# Patient Record
Sex: Female | Born: 1935 | Race: White | Hispanic: No | Smoking: Never smoker
Health system: Southern US, Community
[De-identification: ages and names within clinical notes are randomized; demographics above are authoritative.]

## PROBLEM LIST (undated history)

## (undated) DIAGNOSIS — N184 Chronic kidney disease, stage 4 (severe): Secondary | ICD-10-CM

## (undated) DIAGNOSIS — I671 Cerebral aneurysm, nonruptured: Secondary | ICD-10-CM

## (undated) DIAGNOSIS — F419 Anxiety disorder, unspecified: Secondary | ICD-10-CM

## (undated) DIAGNOSIS — Z8639 Personal history of other endocrine, nutritional and metabolic disease: Secondary | ICD-10-CM

## (undated) DIAGNOSIS — E041 Nontoxic single thyroid nodule: Secondary | ICD-10-CM

## (undated) DIAGNOSIS — Z8669 Personal history of other diseases of the nervous system and sense organs: Secondary | ICD-10-CM

## (undated) DIAGNOSIS — K589 Irritable bowel syndrome without diarrhea: Secondary | ICD-10-CM

## (undated) DIAGNOSIS — M1990 Unspecified osteoarthritis, unspecified site: Secondary | ICD-10-CM

## (undated) DIAGNOSIS — I5032 Chronic diastolic (congestive) heart failure: Secondary | ICD-10-CM

## (undated) DIAGNOSIS — K219 Gastro-esophageal reflux disease without esophagitis: Secondary | ICD-10-CM

## (undated) DIAGNOSIS — F329 Major depressive disorder, single episode, unspecified: Secondary | ICD-10-CM

## (undated) DIAGNOSIS — G2581 Restless legs syndrome: Secondary | ICD-10-CM

## (undated) DIAGNOSIS — H5440 Blindness, one eye, unspecified eye: Secondary | ICD-10-CM

## (undated) DIAGNOSIS — K5730 Diverticulosis of large intestine without perforation or abscess without bleeding: Secondary | ICD-10-CM

## (undated) DIAGNOSIS — H3530 Unspecified macular degeneration: Secondary | ICD-10-CM

## (undated) DIAGNOSIS — E0590 Thyrotoxicosis, unspecified without thyrotoxic crisis or storm: Secondary | ICD-10-CM

## (undated) DIAGNOSIS — R040 Epistaxis: Secondary | ICD-10-CM

## (undated) DIAGNOSIS — E785 Hyperlipidemia, unspecified: Secondary | ICD-10-CM

## (undated) DIAGNOSIS — I495 Sick sinus syndrome: Secondary | ICD-10-CM

## (undated) DIAGNOSIS — I10 Essential (primary) hypertension: Secondary | ICD-10-CM

## (undated) DIAGNOSIS — I351 Nonrheumatic aortic (valve) insufficiency: Secondary | ICD-10-CM

## (undated) DIAGNOSIS — F32A Depression, unspecified: Secondary | ICD-10-CM

## (undated) DIAGNOSIS — I2510 Atherosclerotic heart disease of native coronary artery without angina pectoris: Secondary | ICD-10-CM

## (undated) DIAGNOSIS — F410 Panic disorder [episodic paroxysmal anxiety] without agoraphobia: Secondary | ICD-10-CM

## (undated) DIAGNOSIS — D509 Iron deficiency anemia, unspecified: Secondary | ICD-10-CM

## (undated) DIAGNOSIS — M109 Gout, unspecified: Secondary | ICD-10-CM

## (undated) DIAGNOSIS — I4821 Permanent atrial fibrillation: Secondary | ICD-10-CM

## (undated) HISTORY — DX: Irritable bowel syndrome, unspecified: K58.9

## (undated) HISTORY — DX: Nonrheumatic aortic (valve) insufficiency: I35.1

## (undated) HISTORY — DX: Sick sinus syndrome: I49.5

## (undated) HISTORY — DX: Permanent atrial fibrillation: I48.21

## (undated) HISTORY — DX: Personal history of other diseases of the nervous system and sense organs: Z86.69

## (undated) HISTORY — PX: VESICOVAGINAL FISTULA CLOSURE W/ TAH: SUR271

## (undated) HISTORY — DX: Iron deficiency anemia, unspecified: D50.9

## (undated) HISTORY — DX: Major depressive disorder, single episode, unspecified: F32.9

## (undated) HISTORY — DX: Depression, unspecified: F32.A

## (undated) HISTORY — DX: Hyperlipidemia, unspecified: E78.5

## (undated) HISTORY — DX: Gastro-esophageal reflux disease without esophagitis: K21.9

## (undated) HISTORY — DX: Essential (primary) hypertension: I10

## (undated) HISTORY — PX: BRAIN SURGERY: SHX531

## (undated) HISTORY — DX: Personal history of other endocrine, nutritional and metabolic disease: Z86.39

## (undated) HISTORY — DX: Epistaxis: R04.0

## (undated) HISTORY — DX: Unspecified osteoarthritis, unspecified site: M19.90

## (undated) HISTORY — DX: Nontoxic single thyroid nodule: E04.1

## (undated) HISTORY — DX: Chronic diastolic (congestive) heart failure: I50.32

## (undated) HISTORY — DX: Diverticulosis of large intestine without perforation or abscess without bleeding: K57.30

## (undated) HISTORY — DX: Panic disorder (episodic paroxysmal anxiety): F41.0

## (undated) HISTORY — DX: Cerebral aneurysm, nonruptured: I67.1

## (undated) HISTORY — PX: OTHER SURGICAL HISTORY: SHX169

---
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
## 1967-01-24 HISTORY — PX: ABDOMINAL HYSTERECTOMY: SHX81

## 1967-01-24 HISTORY — PX: APPENDECTOMY: SHX54

## 1997-10-23 ENCOUNTER — Other Ambulatory Visit: Admission: RE | Admit: 1997-10-23 | Discharge: 1997-10-23 | Payer: Self-pay | Admitting: Obstetrics and Gynecology

## 1997-12-27 ENCOUNTER — Emergency Department (HOSPITAL_COMMUNITY): Admission: EM | Admit: 1997-12-27 | Discharge: 1997-12-27 | Payer: Self-pay | Admitting: Emergency Medicine

## 1997-12-27 ENCOUNTER — Encounter: Payer: Self-pay | Admitting: Emergency Medicine

## 1998-01-23 HISTORY — PX: CHOLECYSTECTOMY: SHX55

## 1998-01-23 HISTORY — PX: EYE SURGERY: SHX253

## 1998-03-12 ENCOUNTER — Ambulatory Visit (HOSPITAL_COMMUNITY): Admission: RE | Admit: 1998-03-12 | Discharge: 1998-03-12 | Payer: Self-pay | Admitting: Internal Medicine

## 1998-09-09 ENCOUNTER — Ambulatory Visit (HOSPITAL_COMMUNITY): Admission: RE | Admit: 1998-09-09 | Discharge: 1998-09-09 | Payer: Self-pay | Admitting: Obstetrics and Gynecology

## 1998-10-22 ENCOUNTER — Encounter: Payer: Self-pay | Admitting: General Surgery

## 1998-10-26 ENCOUNTER — Encounter: Payer: Self-pay | Admitting: General Surgery

## 1998-10-27 ENCOUNTER — Inpatient Hospital Stay (HOSPITAL_COMMUNITY): Admission: RE | Admit: 1998-10-27 | Discharge: 1998-10-28 | Payer: Self-pay | Admitting: General Surgery

## 1998-10-27 ENCOUNTER — Encounter: Payer: Self-pay | Admitting: Gastroenterology

## 1998-11-16 ENCOUNTER — Other Ambulatory Visit: Admission: RE | Admit: 1998-11-16 | Discharge: 1998-11-16 | Payer: Self-pay | Admitting: Obstetrics and Gynecology

## 2000-06-26 ENCOUNTER — Ambulatory Visit (HOSPITAL_COMMUNITY): Admission: RE | Admit: 2000-06-26 | Discharge: 2000-06-26 | Payer: Self-pay | Admitting: Gastroenterology

## 2000-06-26 ENCOUNTER — Encounter (INDEPENDENT_AMBULATORY_CARE_PROVIDER_SITE_OTHER): Payer: Self-pay

## 2000-08-21 ENCOUNTER — Other Ambulatory Visit: Admission: RE | Admit: 2000-08-21 | Discharge: 2000-08-21 | Payer: Self-pay | Admitting: Obstetrics and Gynecology

## 2000-12-13 ENCOUNTER — Encounter: Payer: Self-pay | Admitting: Ophthalmology

## 2000-12-13 ENCOUNTER — Encounter: Admission: RE | Admit: 2000-12-13 | Discharge: 2000-12-13 | Payer: Self-pay | Admitting: Ophthalmology

## 2000-12-14 ENCOUNTER — Ambulatory Visit (HOSPITAL_BASED_OUTPATIENT_CLINIC_OR_DEPARTMENT_OTHER): Admission: RE | Admit: 2000-12-14 | Discharge: 2000-12-14 | Payer: Self-pay | Admitting: Ophthalmology

## 2003-02-10 ENCOUNTER — Other Ambulatory Visit: Admission: RE | Admit: 2003-02-10 | Discharge: 2003-02-10 | Payer: Self-pay | Admitting: Obstetrics and Gynecology

## 2003-07-16 ENCOUNTER — Encounter: Admission: RE | Admit: 2003-07-16 | Discharge: 2003-07-16 | Payer: Self-pay | Admitting: Internal Medicine

## 2003-07-21 ENCOUNTER — Other Ambulatory Visit: Admission: RE | Admit: 2003-07-21 | Discharge: 2003-07-21 | Payer: Self-pay | Admitting: Obstetrics and Gynecology

## 2003-08-10 ENCOUNTER — Encounter: Admission: RE | Admit: 2003-08-10 | Discharge: 2003-08-10 | Payer: Self-pay | Admitting: General Surgery

## 2003-08-11 ENCOUNTER — Ambulatory Visit (HOSPITAL_BASED_OUTPATIENT_CLINIC_OR_DEPARTMENT_OTHER): Admission: RE | Admit: 2003-08-11 | Discharge: 2003-08-11 | Payer: Self-pay | Admitting: General Surgery

## 2003-08-11 ENCOUNTER — Encounter (INDEPENDENT_AMBULATORY_CARE_PROVIDER_SITE_OTHER): Payer: Self-pay | Admitting: *Deleted

## 2003-08-11 ENCOUNTER — Ambulatory Visit (HOSPITAL_COMMUNITY): Admission: RE | Admit: 2003-08-11 | Discharge: 2003-08-11 | Payer: Self-pay | Admitting: General Surgery

## 2004-05-23 ENCOUNTER — Encounter: Admission: RE | Admit: 2004-05-23 | Discharge: 2004-05-23 | Payer: Self-pay | Admitting: Internal Medicine

## 2004-07-04 ENCOUNTER — Encounter: Admission: RE | Admit: 2004-07-04 | Discharge: 2004-07-04 | Payer: Self-pay | Admitting: Obstetrics and Gynecology

## 2005-01-10 ENCOUNTER — Other Ambulatory Visit: Admission: RE | Admit: 2005-01-10 | Discharge: 2005-01-10 | Payer: Self-pay | Admitting: Obstetrics and Gynecology

## 2005-01-23 DIAGNOSIS — I2510 Atherosclerotic heart disease of native coronary artery without angina pectoris: Secondary | ICD-10-CM

## 2005-01-23 HISTORY — DX: Atherosclerotic heart disease of native coronary artery without angina pectoris: I25.10

## 2005-01-23 HISTORY — PX: SUBDURAL HEMATOMA EVACUATION VIA CRANIOTOMY: SUR319

## 2005-01-23 HISTORY — PX: CORONARY ANGIOPLASTY WITH STENT PLACEMENT: SHX49

## 2005-06-20 ENCOUNTER — Emergency Department (HOSPITAL_COMMUNITY): Admission: EM | Admit: 2005-06-20 | Discharge: 2005-06-20 | Payer: Self-pay | Admitting: *Deleted

## 2005-08-07 ENCOUNTER — Encounter: Admission: RE | Admit: 2005-08-07 | Discharge: 2005-08-07 | Payer: Self-pay | Admitting: Obstetrics and Gynecology

## 2005-09-04 ENCOUNTER — Inpatient Hospital Stay (HOSPITAL_COMMUNITY): Admission: AC | Admit: 2005-09-04 | Discharge: 2005-09-18 | Payer: Self-pay

## 2005-09-07 ENCOUNTER — Ambulatory Visit: Payer: Self-pay | Admitting: Physical Medicine & Rehabilitation

## 2005-10-02 ENCOUNTER — Encounter: Admission: RE | Admit: 2005-10-02 | Discharge: 2005-10-02 | Payer: Self-pay | Admitting: Neurological Surgery

## 2005-10-09 ENCOUNTER — Encounter: Admission: RE | Admit: 2005-10-09 | Discharge: 2005-10-25 | Payer: Self-pay | Admitting: Anesthesiology

## 2005-11-14 ENCOUNTER — Encounter: Admission: RE | Admit: 2005-11-14 | Discharge: 2005-11-14 | Payer: Self-pay | Admitting: Neurological Surgery

## 2005-12-16 ENCOUNTER — Emergency Department (HOSPITAL_COMMUNITY): Admission: EM | Admit: 2005-12-16 | Discharge: 2005-12-17 | Payer: Self-pay | Admitting: Emergency Medicine

## 2005-12-23 ENCOUNTER — Inpatient Hospital Stay (HOSPITAL_COMMUNITY): Admission: EM | Admit: 2005-12-23 | Discharge: 2005-12-31 | Payer: Self-pay | Admitting: Emergency Medicine

## 2006-01-25 ENCOUNTER — Encounter (HOSPITAL_COMMUNITY): Admission: RE | Admit: 2006-01-25 | Discharge: 2006-04-25 | Payer: Self-pay | Admitting: Cardiovascular Disease

## 2006-01-31 ENCOUNTER — Ambulatory Visit: Payer: Self-pay | Admitting: Internal Medicine

## 2006-02-01 LAB — CONVERTED CEMR LAB
Basophils Absolute: 0 10*3/uL (ref 0.0–0.1)
Basophils Relative: 0 % (ref 0.0–1.0)
Eosinophil percent: 3 % (ref 0.0–5.0)
Lymphocytes Relative: 24.5 % (ref 12.0–46.0)
MCV: 97 fL (ref 78.0–100.0)
Monocytes Relative: 10.7 % (ref 3.0–11.0)
Neutro Abs: 3 10*3/uL (ref 1.4–7.7)
Platelets: 205 10*3/uL (ref 150–400)
RBC: 3.11 M/uL — ABNORMAL LOW (ref 3.87–5.11)
WBC: 4.8 10*3/uL (ref 4.5–10.5)

## 2006-02-08 ENCOUNTER — Ambulatory Visit: Payer: Self-pay | Admitting: Internal Medicine

## 2006-03-01 ENCOUNTER — Encounter: Admission: RE | Admit: 2006-03-01 | Discharge: 2006-03-01 | Payer: Self-pay | Admitting: Ophthalmology

## 2006-03-07 ENCOUNTER — Emergency Department (HOSPITAL_COMMUNITY): Admission: EM | Admit: 2006-03-07 | Discharge: 2006-03-07 | Payer: Self-pay | Admitting: Emergency Medicine

## 2006-04-12 ENCOUNTER — Encounter: Payer: Self-pay | Admitting: Cardiology

## 2006-04-26 ENCOUNTER — Encounter (HOSPITAL_COMMUNITY): Admission: RE | Admit: 2006-04-26 | Discharge: 2006-05-13 | Payer: Self-pay | Admitting: Cardiovascular Disease

## 2006-05-01 ENCOUNTER — Encounter: Payer: Self-pay | Admitting: Cardiology

## 2006-05-14 ENCOUNTER — Encounter (HOSPITAL_COMMUNITY): Admission: RE | Admit: 2006-05-14 | Discharge: 2006-08-12 | Payer: Self-pay | Admitting: Cardiovascular Disease

## 2006-06-13 ENCOUNTER — Inpatient Hospital Stay (HOSPITAL_COMMUNITY): Admission: AD | Admit: 2006-06-13 | Discharge: 2006-06-18 | Payer: Self-pay | Admitting: Cardiovascular Disease

## 2006-07-18 ENCOUNTER — Ambulatory Visit: Payer: Self-pay | Admitting: Internal Medicine

## 2006-09-06 ENCOUNTER — Encounter: Admission: RE | Admit: 2006-09-06 | Discharge: 2006-09-06 | Payer: Self-pay | Admitting: Obstetrics and Gynecology

## 2007-01-24 DIAGNOSIS — I495 Sick sinus syndrome: Secondary | ICD-10-CM

## 2007-01-24 HISTORY — PX: PACEMAKER INSERTION: SHX728

## 2007-01-24 HISTORY — DX: Sick sinus syndrome: I49.5

## 2007-01-28 ENCOUNTER — Inpatient Hospital Stay (HOSPITAL_COMMUNITY): Admission: AD | Admit: 2007-01-28 | Discharge: 2007-01-31 | Payer: Self-pay | Admitting: Cardiovascular Disease

## 2007-01-31 ENCOUNTER — Encounter: Payer: Self-pay | Admitting: Cardiology

## 2007-03-18 ENCOUNTER — Ambulatory Visit (HOSPITAL_COMMUNITY): Admission: RE | Admit: 2007-03-18 | Discharge: 2007-03-18 | Payer: Self-pay | Admitting: Cardiovascular Disease

## 2007-04-03 ENCOUNTER — Encounter: Payer: Self-pay | Admitting: Cardiology

## 2007-05-21 DIAGNOSIS — F411 Generalized anxiety disorder: Secondary | ICD-10-CM | POA: Insufficient documentation

## 2007-05-21 DIAGNOSIS — M109 Gout, unspecified: Secondary | ICD-10-CM | POA: Insufficient documentation

## 2007-05-21 DIAGNOSIS — K5730 Diverticulosis of large intestine without perforation or abscess without bleeding: Secondary | ICD-10-CM | POA: Insufficient documentation

## 2007-05-21 DIAGNOSIS — I671 Cerebral aneurysm, nonruptured: Secondary | ICD-10-CM | POA: Insufficient documentation

## 2007-05-21 DIAGNOSIS — E785 Hyperlipidemia, unspecified: Secondary | ICD-10-CM | POA: Insufficient documentation

## 2007-06-18 ENCOUNTER — Encounter: Admission: RE | Admit: 2007-06-18 | Discharge: 2007-06-18 | Payer: Self-pay | Admitting: Obstetrics and Gynecology

## 2007-06-27 ENCOUNTER — Telehealth: Payer: Self-pay | Admitting: Internal Medicine

## 2007-06-28 DIAGNOSIS — N179 Acute kidney failure, unspecified: Secondary | ICD-10-CM

## 2007-06-28 DIAGNOSIS — I252 Old myocardial infarction: Secondary | ICD-10-CM | POA: Insufficient documentation

## 2007-06-28 DIAGNOSIS — D509 Iron deficiency anemia, unspecified: Secondary | ICD-10-CM | POA: Insufficient documentation

## 2007-06-28 DIAGNOSIS — K582 Mixed irritable bowel syndrome: Secondary | ICD-10-CM | POA: Insufficient documentation

## 2007-06-28 DIAGNOSIS — F410 Panic disorder [episodic paroxysmal anxiety] without agoraphobia: Secondary | ICD-10-CM | POA: Insufficient documentation

## 2007-06-28 DIAGNOSIS — K219 Gastro-esophageal reflux disease without esophagitis: Secondary | ICD-10-CM | POA: Insufficient documentation

## 2007-06-28 DIAGNOSIS — N184 Chronic kidney disease, stage 4 (severe): Secondary | ICD-10-CM | POA: Insufficient documentation

## 2007-06-28 DIAGNOSIS — I10 Essential (primary) hypertension: Secondary | ICD-10-CM | POA: Insufficient documentation

## 2007-07-01 ENCOUNTER — Encounter: Payer: Self-pay | Admitting: Cardiology

## 2007-07-01 ENCOUNTER — Ambulatory Visit: Payer: Self-pay | Admitting: Internal Medicine

## 2007-07-01 ENCOUNTER — Encounter (HOSPITAL_COMMUNITY): Admission: RE | Admit: 2007-07-01 | Discharge: 2007-09-29 | Payer: Self-pay | Admitting: Cardiovascular Disease

## 2007-07-01 DIAGNOSIS — R159 Full incontinence of feces: Secondary | ICD-10-CM | POA: Insufficient documentation

## 2007-08-22 IMAGING — CR DG ANKLE COMPLETE 3+V*L*
3 series · 3 of 3 positions shown · non-contrast
Comparison: none

CLINICAL DATA: Fall today, pain anterior aspect left ankle.
 LEFT ANKLE ? 3 VIEW:

[view not recorded (1 of 3)]
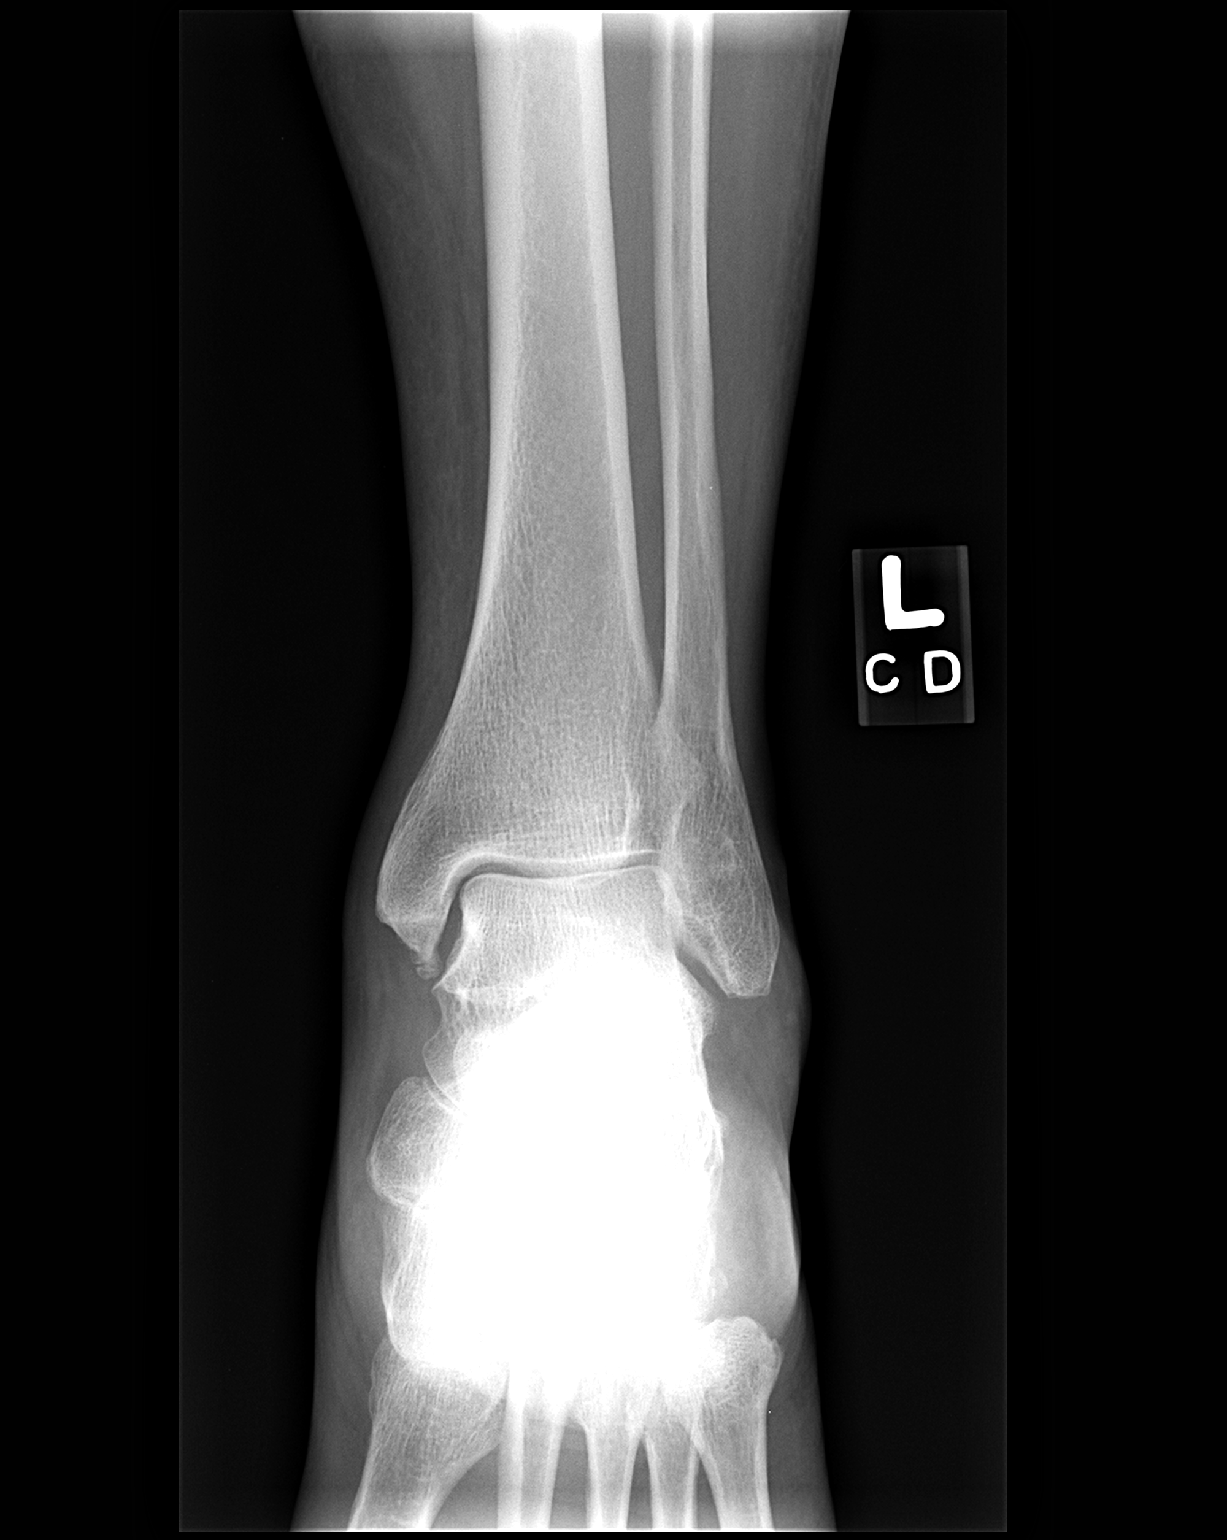

[view not recorded (2 of 3)]
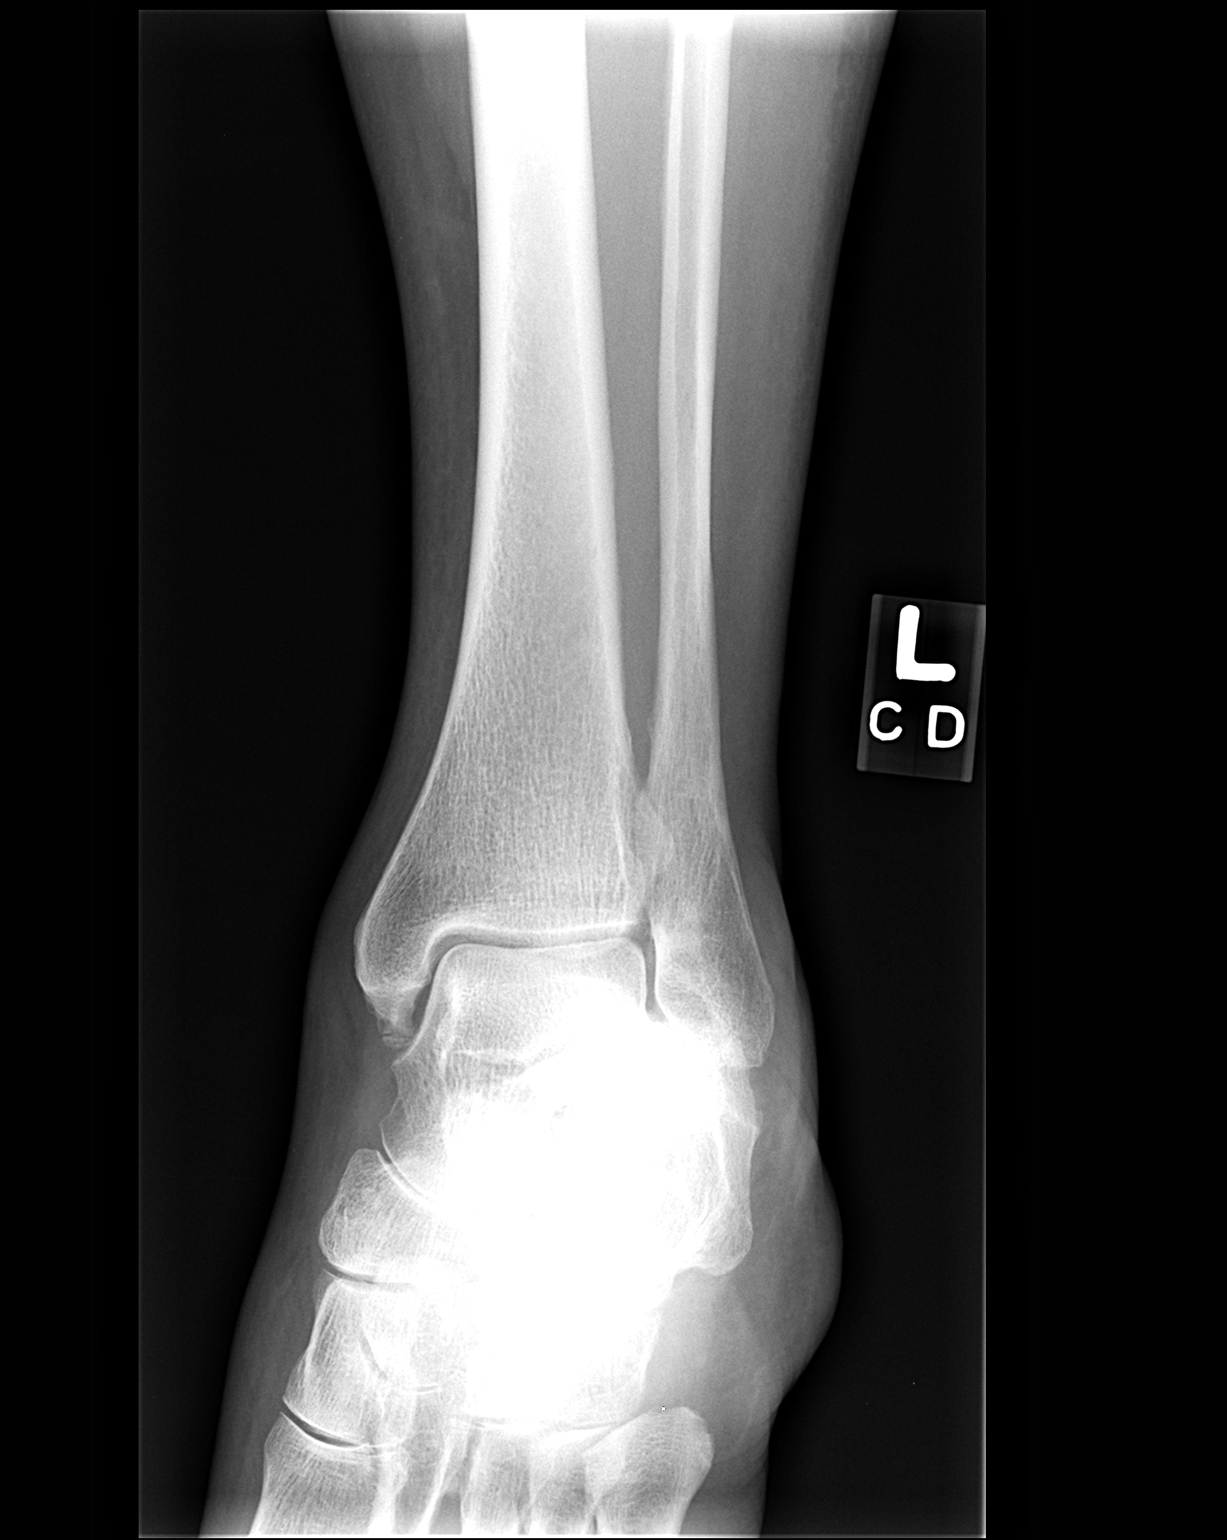

[view not recorded (3 of 3)]
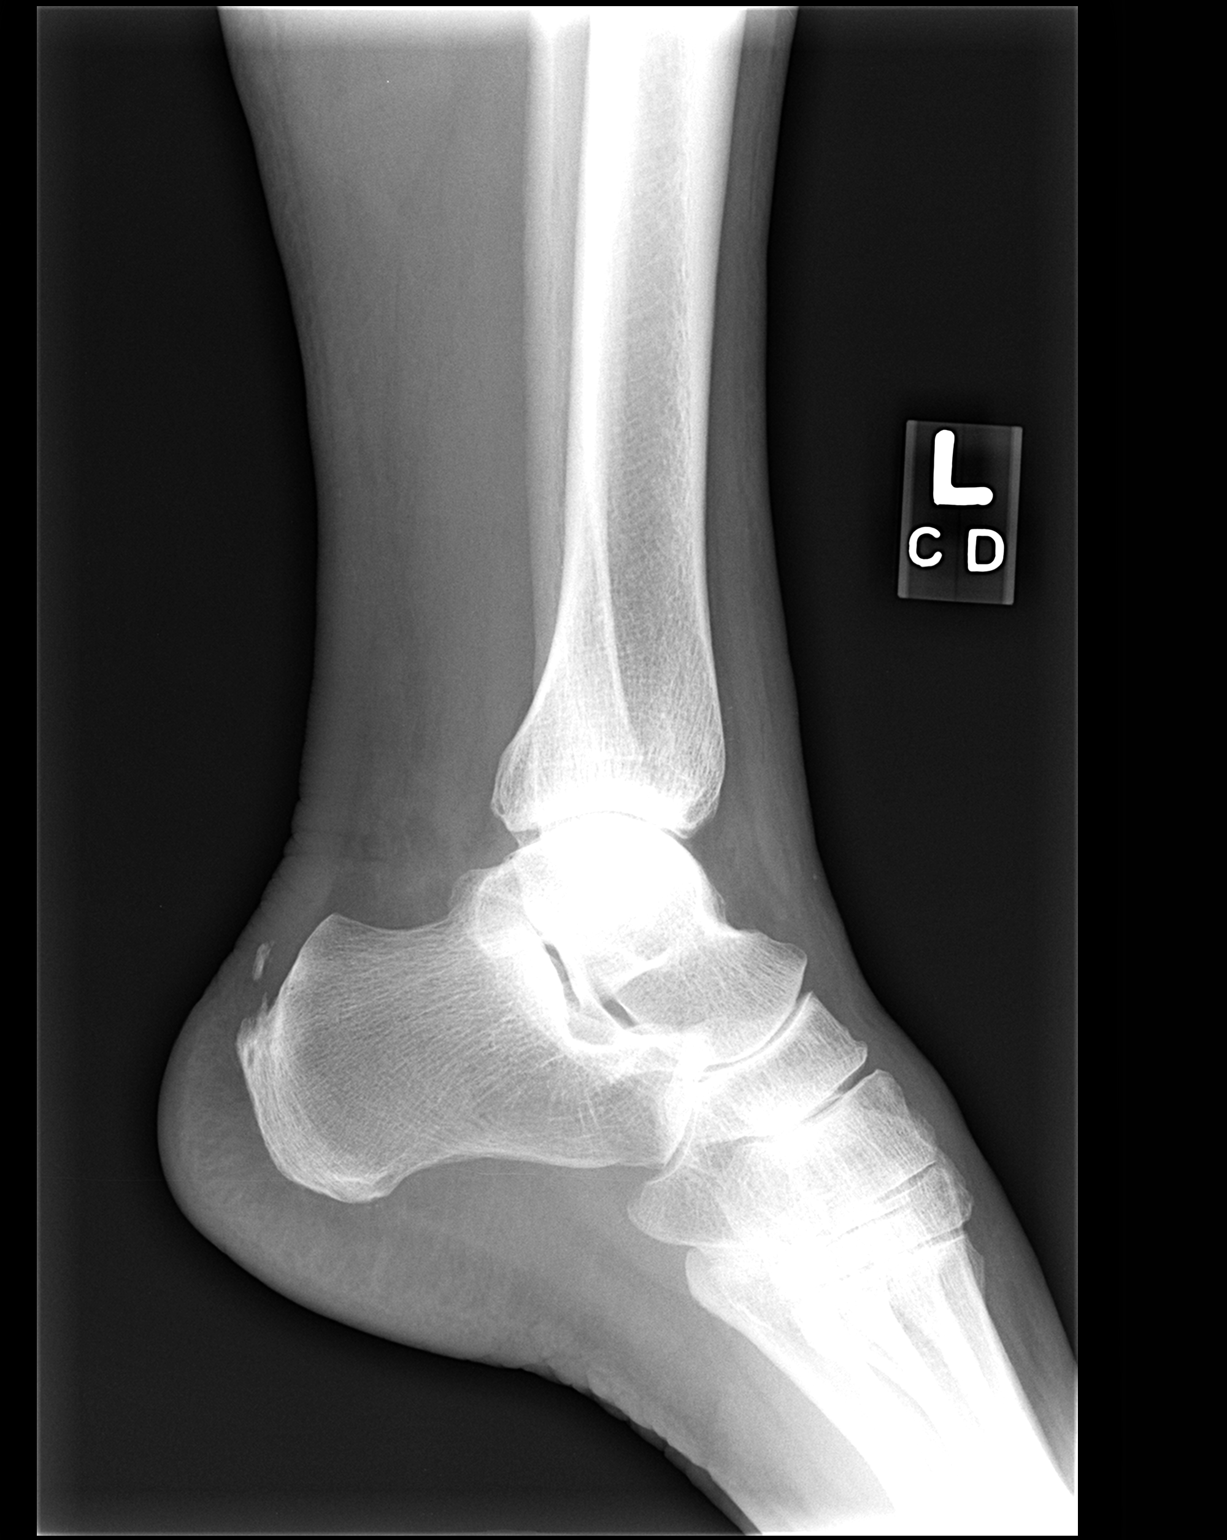

[3 of 3 positions shown; findings below may reference images not displayed]

FINDINGS: Three views of the left ankle show mild anterior medial and lateral ankle swelling.  No definite acute fracture, dislocation, or foreign body is seen.  There is a posterior superior calcaneal spur but does not appear to be associated with acute fracture.
IMPRESSION: Soft tissue swelling.  No definite fracture, dislocation, or foreign body.

## 2007-09-10 ENCOUNTER — Encounter: Admission: RE | Admit: 2007-09-10 | Discharge: 2007-09-10 | Payer: Self-pay | Admitting: Obstetrics and Gynecology

## 2007-11-07 IMAGING — CT CT HEAD W/O CM
1 series · 16 of 30 positions shown, 20 images · IV contrast (agent unspecified)
Comparison: none

CLINICAL DATA: Subdural hematoma, status post craniotomy.
HEAD CT WITHOUT CONTRAST:
TECHNIQUE: Contiguous axial images were obtained from the base of the skull through the vertex according to standard protocol without contrast.

[Series 2: headseq 4.8 h45s · axial · 0.43mm/px · z∈[+19,+149]mm · 16 of 30 slices shown, 20 images]
[im 2/30  brain]
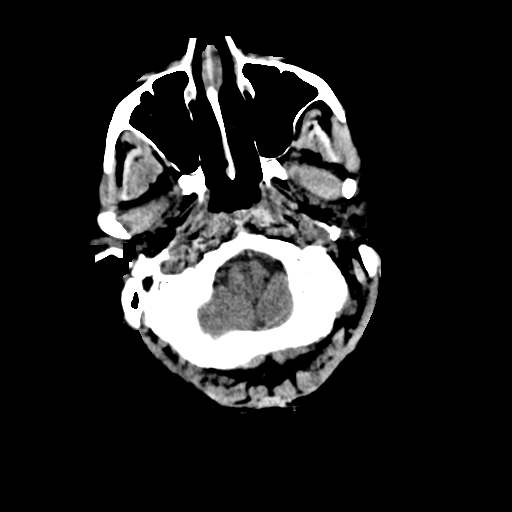
[im 2/30  bone]
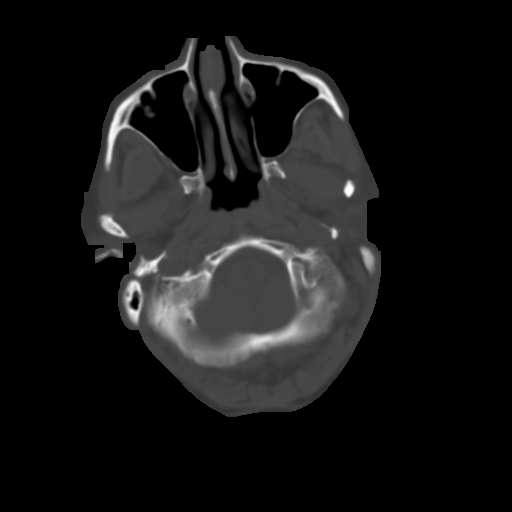
[im 4/30  brain]
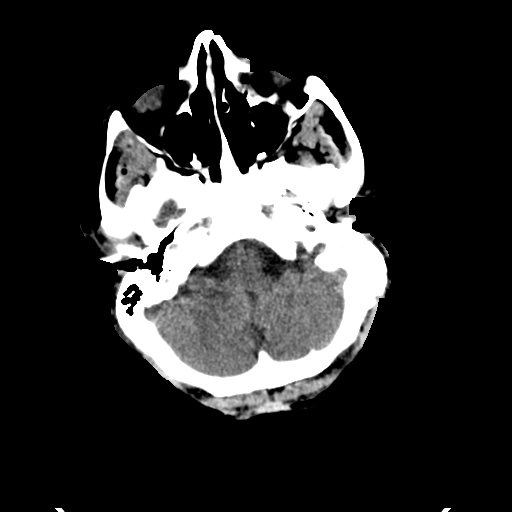
[im 6/30  brain]
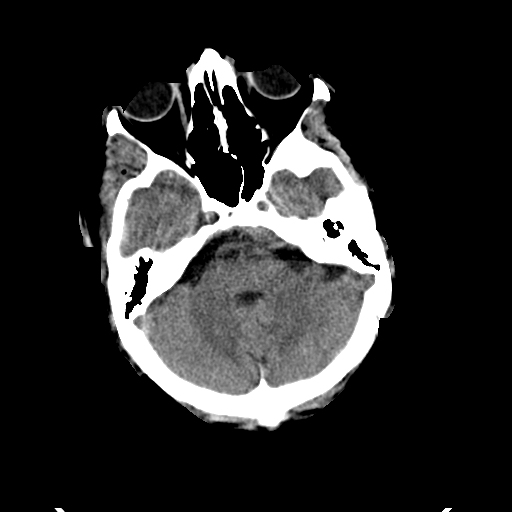
[im 8/30  brain]
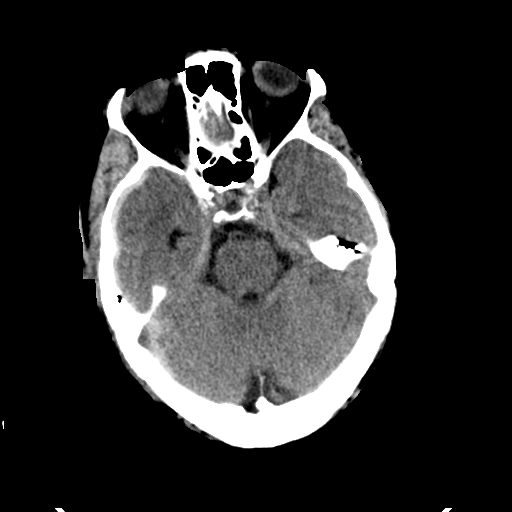
[im 9/30  brain]
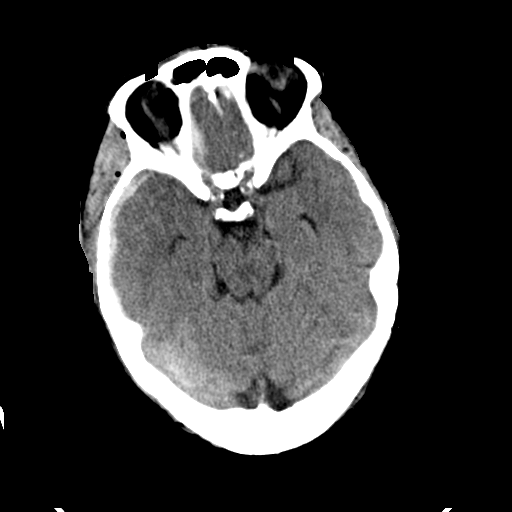
[im 9/30  bone]
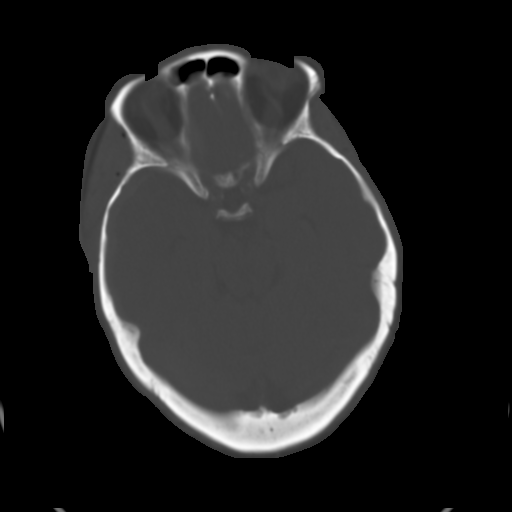
[im 11/30  brain]
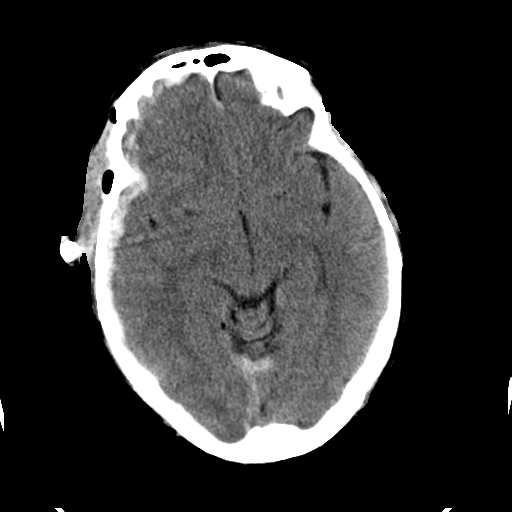
[im 13/30  brain]
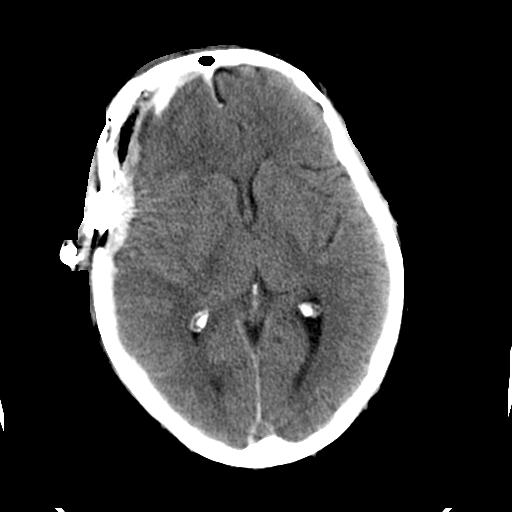
[im 15/30  brain]
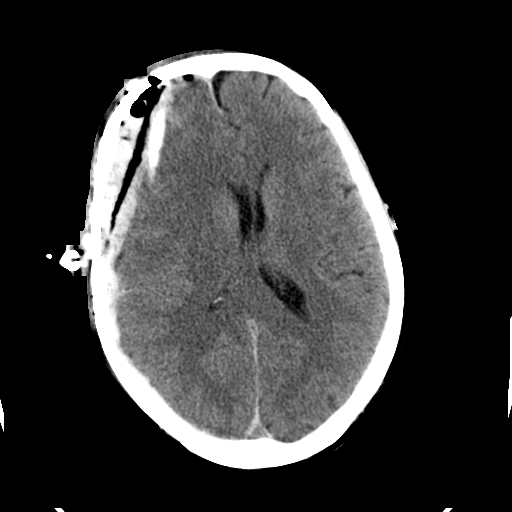
[im 16/30  brain]
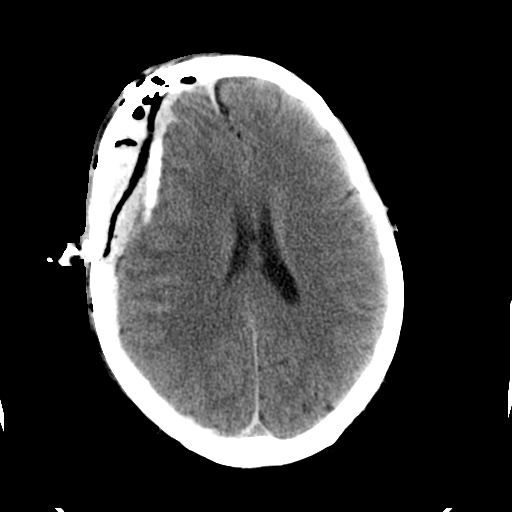
[im 16/30  bone]
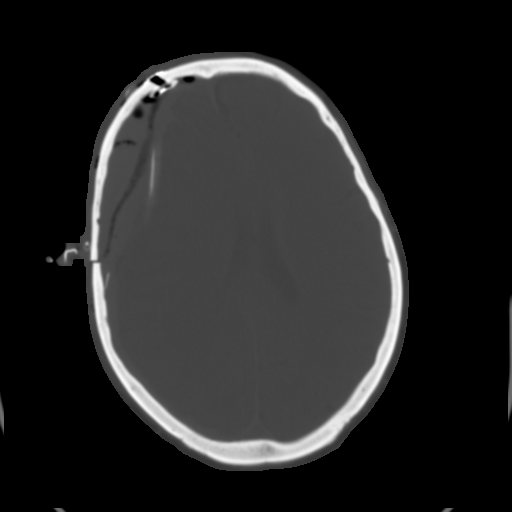
[im 18/30  brain]
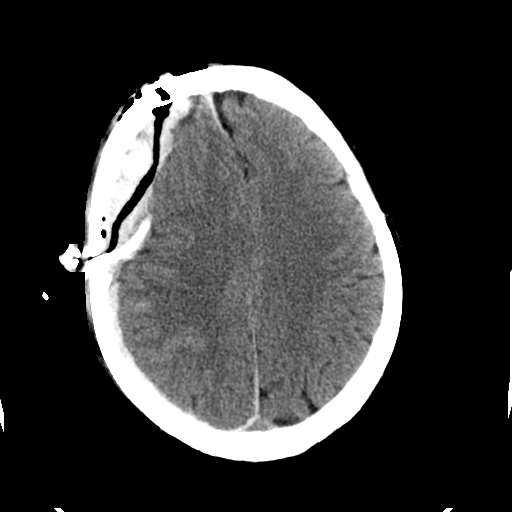
[im 20/30  brain]
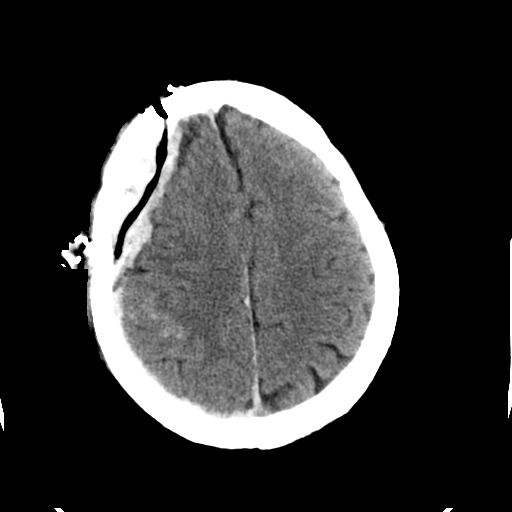
[im 22/30  brain]
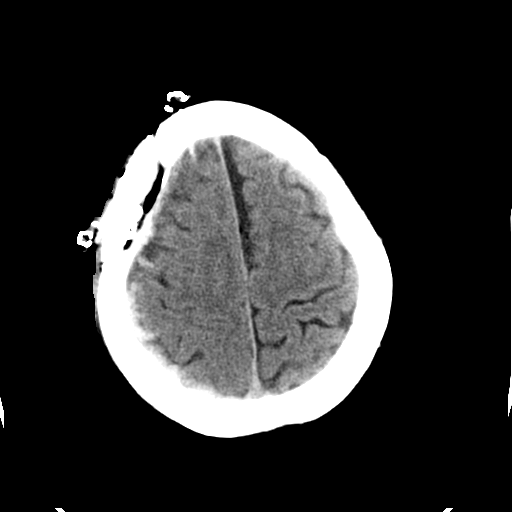
[im 23/30  brain]
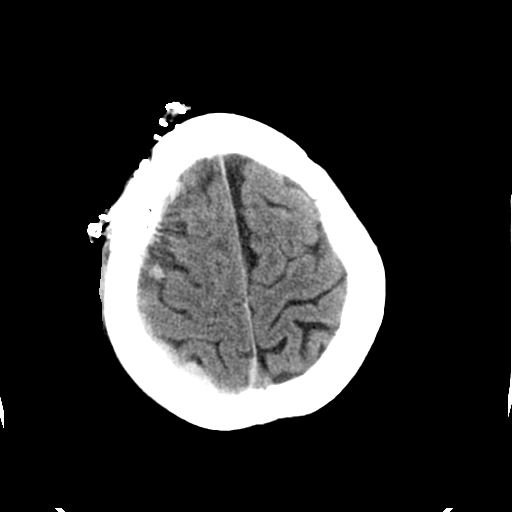
[im 23/30  bone]
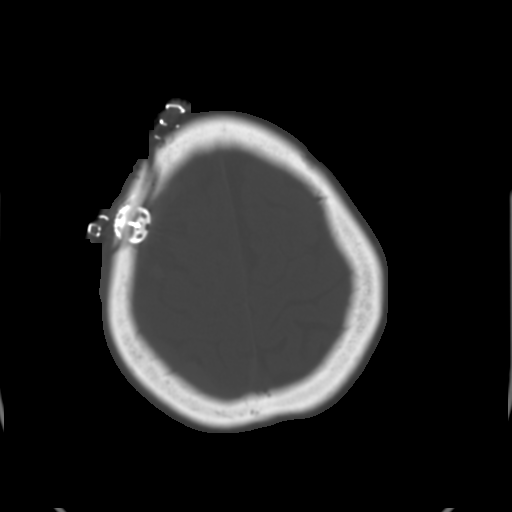
[im 25/30  brain]
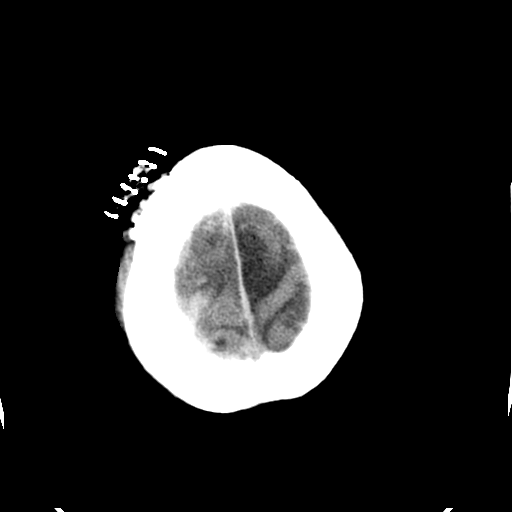
[im 27/30  brain]
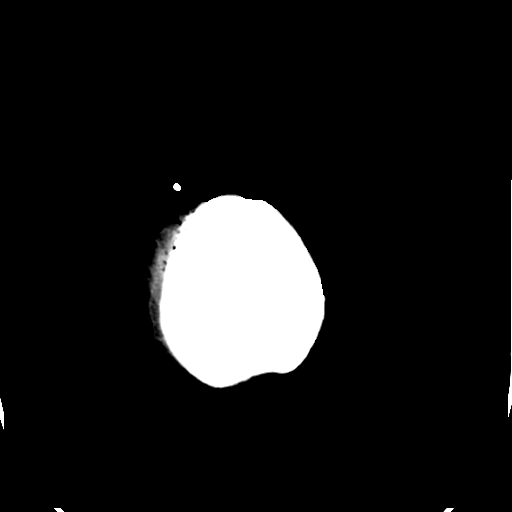
[im 29/30  brain]
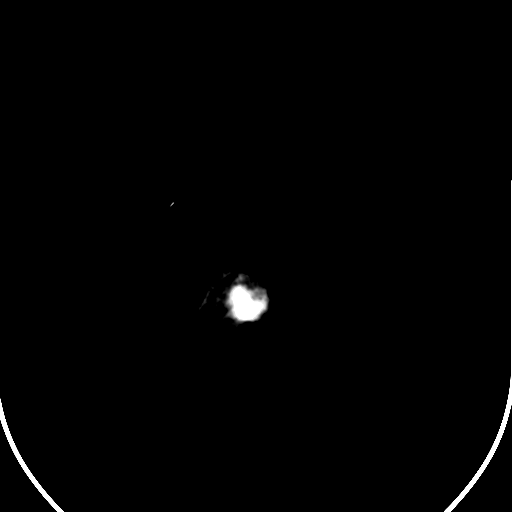

[16 of 30 positions shown; findings below may reference images not displayed]

FINDINGS: The patient is status post right frontal craniotomy and hematoma evacuation, with hyperdense acute subdural hematoma and pneumocephalus present over the right frontoparietal lobe.  There is persistent leftward midline shift of 10 mm, decreased since the prior study.  The ventricles are unchanged in size.  A small high right parietal subarachnoid hemorrhagic component is again identified.  Orbits and paranasal sinuses are intact.  Minimal right pneumocephalus is present with a drain in place.
IMPRESSION: 1.  Postoperative changes status post evacuation of right frontoparietal acute subdural hematoma with decrease in midline shift.  
2.  Stable subarachnoid hemorrhagic component over the right high parietal convexity.  
3.  No new hemorrhage or ventricular dilatation.

## 2008-01-27 ENCOUNTER — Encounter (HOSPITAL_COMMUNITY): Admission: RE | Admit: 2008-01-27 | Discharge: 2008-04-26 | Payer: Self-pay | Admitting: Cardiology

## 2008-06-11 ENCOUNTER — Encounter: Payer: Self-pay | Admitting: Cardiology

## 2008-06-16 ENCOUNTER — Ambulatory Visit: Payer: Self-pay | Admitting: Cardiology

## 2008-06-17 ENCOUNTER — Encounter (INDEPENDENT_AMBULATORY_CARE_PROVIDER_SITE_OTHER): Payer: Self-pay | Admitting: *Deleted

## 2008-06-19 ENCOUNTER — Encounter: Payer: Self-pay | Admitting: Cardiology

## 2008-06-23 ENCOUNTER — Encounter: Payer: Self-pay | Admitting: *Deleted

## 2008-06-30 ENCOUNTER — Ambulatory Visit: Payer: Self-pay | Admitting: Cardiology

## 2008-07-13 ENCOUNTER — Ambulatory Visit: Payer: Self-pay | Admitting: Internal Medicine

## 2008-07-13 ENCOUNTER — Encounter (INDEPENDENT_AMBULATORY_CARE_PROVIDER_SITE_OTHER): Payer: Self-pay | Admitting: *Deleted

## 2008-07-13 LAB — CONVERTED CEMR LAB
POC INR: 1.9
Protime: 17

## 2008-07-20 ENCOUNTER — Ambulatory Visit: Payer: Self-pay | Admitting: Cardiology

## 2008-07-20 LAB — CONVERTED CEMR LAB
POC INR: 2.1
Prothrombin Time: 17.9 s

## 2008-07-29 ENCOUNTER — Ambulatory Visit: Payer: Self-pay | Admitting: Internal Medicine

## 2008-07-29 ENCOUNTER — Encounter: Payer: Self-pay | Admitting: *Deleted

## 2008-07-30 ENCOUNTER — Ambulatory Visit: Payer: Self-pay | Admitting: Cardiology

## 2008-07-31 ENCOUNTER — Encounter: Payer: Self-pay | Admitting: Internal Medicine

## 2008-07-31 ENCOUNTER — Telehealth: Payer: Self-pay | Admitting: Internal Medicine

## 2008-07-31 ENCOUNTER — Ambulatory Visit: Payer: Self-pay

## 2008-08-02 LAB — CONVERTED CEMR LAB: OCCULT 1: NEGATIVE

## 2008-08-18 ENCOUNTER — Ambulatory Visit: Payer: Self-pay | Admitting: Internal Medicine

## 2008-08-18 LAB — CONVERTED CEMR LAB: POC INR: 1.5

## 2008-09-01 ENCOUNTER — Ambulatory Visit: Payer: Self-pay | Admitting: Internal Medicine

## 2008-09-01 LAB — CONVERTED CEMR LAB
POC INR: 1.9
Prothrombin Time: 17 s

## 2008-09-02 ENCOUNTER — Encounter: Payer: Self-pay | Admitting: Cardiology

## 2008-09-14 ENCOUNTER — Encounter: Admission: RE | Admit: 2008-09-14 | Discharge: 2008-09-14 | Payer: Self-pay | Admitting: Obstetrics and Gynecology

## 2008-09-15 ENCOUNTER — Ambulatory Visit: Payer: Self-pay | Admitting: Internal Medicine

## 2008-09-17 ENCOUNTER — Encounter (INDEPENDENT_AMBULATORY_CARE_PROVIDER_SITE_OTHER): Payer: Self-pay | Admitting: *Deleted

## 2008-10-21 ENCOUNTER — Ambulatory Visit: Payer: Self-pay | Admitting: Cardiovascular Disease

## 2008-10-21 LAB — CONVERTED CEMR LAB: POC INR: 1.5

## 2008-10-27 ENCOUNTER — Encounter: Payer: Self-pay | Admitting: Cardiology

## 2008-11-02 ENCOUNTER — Ambulatory Visit: Payer: Self-pay | Admitting: Internal Medicine

## 2008-11-03 ENCOUNTER — Encounter (INDEPENDENT_AMBULATORY_CARE_PROVIDER_SITE_OTHER): Payer: Self-pay | Admitting: *Deleted

## 2008-11-16 ENCOUNTER — Ambulatory Visit: Payer: Self-pay | Admitting: Cardiovascular Disease

## 2008-11-25 ENCOUNTER — Encounter (HOSPITAL_COMMUNITY): Admission: RE | Admit: 2008-11-25 | Discharge: 2009-02-22 | Payer: Self-pay | Admitting: Cardiology

## 2008-11-30 ENCOUNTER — Ambulatory Visit: Payer: Self-pay | Admitting: Cardiology

## 2008-12-01 ENCOUNTER — Telehealth (INDEPENDENT_AMBULATORY_CARE_PROVIDER_SITE_OTHER): Payer: Self-pay | Admitting: *Deleted

## 2008-12-03 ENCOUNTER — Telehealth (INDEPENDENT_AMBULATORY_CARE_PROVIDER_SITE_OTHER): Payer: Self-pay | Admitting: *Deleted

## 2008-12-07 ENCOUNTER — Ambulatory Visit: Payer: Self-pay | Admitting: Cardiology

## 2008-12-07 ENCOUNTER — Encounter (HOSPITAL_COMMUNITY): Admission: RE | Admit: 2008-12-07 | Discharge: 2009-01-22 | Payer: Self-pay | Admitting: Cardiology

## 2008-12-07 ENCOUNTER — Ambulatory Visit: Payer: Self-pay | Admitting: Internal Medicine

## 2008-12-07 ENCOUNTER — Ambulatory Visit: Payer: Self-pay

## 2008-12-07 LAB — CONVERTED CEMR LAB: POC INR: 2.1

## 2008-12-15 ENCOUNTER — Telehealth (INDEPENDENT_AMBULATORY_CARE_PROVIDER_SITE_OTHER): Payer: Self-pay | Admitting: *Deleted

## 2008-12-28 ENCOUNTER — Encounter: Payer: Self-pay | Admitting: Cardiology

## 2009-01-05 ENCOUNTER — Ambulatory Visit: Payer: Self-pay | Admitting: Cardiology

## 2009-01-20 ENCOUNTER — Encounter: Payer: Self-pay | Admitting: Cardiology

## 2009-02-05 ENCOUNTER — Ambulatory Visit: Payer: Self-pay | Admitting: Internal Medicine

## 2009-02-12 ENCOUNTER — Telehealth: Payer: Self-pay | Admitting: Cardiology

## 2009-02-19 ENCOUNTER — Encounter: Payer: Self-pay | Admitting: Cardiology

## 2009-03-03 ENCOUNTER — Ambulatory Visit: Payer: Self-pay | Admitting: Cardiology

## 2009-03-03 LAB — CONVERTED CEMR LAB: POC INR: 1.6

## 2009-03-17 ENCOUNTER — Ambulatory Visit: Payer: Self-pay | Admitting: Cardiology

## 2009-03-17 ENCOUNTER — Ambulatory Visit: Payer: Self-pay | Admitting: Internal Medicine

## 2009-03-17 ENCOUNTER — Encounter (INDEPENDENT_AMBULATORY_CARE_PROVIDER_SITE_OTHER): Payer: Self-pay | Admitting: *Deleted

## 2009-03-17 LAB — CONVERTED CEMR LAB
Albumin: 3.5 g/dL (ref 3.5–5.2)
Basophils Relative: 1.3 % (ref 0.0–3.0)
Cholesterol: 127 mg/dL (ref 0–200)
Direct LDL: 36.5 mg/dL
Eosinophils Absolute: 0.1 10*3/uL (ref 0.0–0.7)
HDL: 37 mg/dL — ABNORMAL LOW (ref >39.00)
Hemoglobin: 12.4 g/dL (ref 12.0–15.0)
MCHC: 33.9 g/dL (ref 30.0–36.0)
MCV: 97.2 fL (ref 78.0–100.0)
Monocytes Absolute: 0.5 10*3/uL (ref 0.1–1.0)
Neutro Abs: 3.7 10*3/uL (ref 1.4–7.7)
POC INR: 2.8
RBC: 3.75 M/uL — ABNORMAL LOW (ref 3.87–5.11)
Total Protein: 6.6 g/dL (ref 6.0–8.3)

## 2009-04-07 ENCOUNTER — Ambulatory Visit: Payer: Self-pay | Admitting: Internal Medicine

## 2009-04-07 ENCOUNTER — Telehealth: Payer: Self-pay | Admitting: Cardiology

## 2009-04-07 LAB — CONVERTED CEMR LAB: POC INR: 2.4

## 2009-04-08 ENCOUNTER — Ambulatory Visit: Payer: Self-pay | Admitting: Internal Medicine

## 2009-04-08 DIAGNOSIS — J310 Chronic rhinitis: Secondary | ICD-10-CM | POA: Insufficient documentation

## 2009-04-08 LAB — CONVERTED CEMR LAB
Eosinophils Relative: 1.3 % (ref 0.0–5.0)
IgE (Immunoglobulin E), Serum: <1.5 intl units/mL (ref 0.0–180.0)
Lymphocytes Relative: 20 % (ref 12.0–46.0)
Monocytes Absolute: 0.5 10*3/uL (ref 0.1–1.0)
Monocytes Relative: 7.2 % (ref 3.0–12.0)
Neutrophils Relative %: 65.8 % (ref 43.0–77.0)
Platelets: 236 10*3/uL (ref 150.0–400.0)
WBC: 7.2 10*3/uL (ref 4.5–10.5)

## 2009-04-12 ENCOUNTER — Telehealth: Payer: Self-pay | Admitting: Cardiology

## 2009-04-12 ENCOUNTER — Telehealth (INDEPENDENT_AMBULATORY_CARE_PROVIDER_SITE_OTHER): Payer: Self-pay | Admitting: *Deleted

## 2009-04-14 ENCOUNTER — Encounter: Payer: Self-pay | Admitting: Internal Medicine

## 2009-04-15 ENCOUNTER — Ambulatory Visit: Payer: Self-pay | Admitting: Cardiology

## 2009-04-15 LAB — CONVERTED CEMR LAB
BUN: 29 mg/dL — ABNORMAL HIGH (ref 6–23)
Basophils Relative: 2.3 % (ref 0.0–3.0)
Chloride: 100 meq/L (ref 96–112)
Eosinophils Relative: 1.3 % (ref 0.0–5.0)
HCT: 36.5 % (ref 36.0–46.0)
INR: 2.4 — ABNORMAL HIGH (ref 0.8–1.0)
MCV: 97.4 fL (ref 78.0–100.0)
Platelets: 193 10*3/uL (ref 150.0–400.0)
Potassium: 3.6 meq/L (ref 3.5–5.1)
Prothrombin Time: 25.1 s — ABNORMAL HIGH (ref 9.1–11.7)
RBC: 3.75 M/uL — ABNORMAL LOW (ref 3.87–5.11)
WBC: 7.1 10*3/uL (ref 4.5–10.5)

## 2009-04-17 ENCOUNTER — Telehealth: Payer: Self-pay | Admitting: Adult Health

## 2009-04-22 ENCOUNTER — Encounter: Payer: Self-pay | Admitting: Cardiology

## 2009-04-30 ENCOUNTER — Ambulatory Visit: Payer: Self-pay | Admitting: Internal Medicine

## 2009-05-05 ENCOUNTER — Ambulatory Visit: Payer: Self-pay | Admitting: Internal Medicine

## 2009-05-06 ENCOUNTER — Telehealth: Payer: Self-pay | Admitting: Cardiology

## 2009-05-07 ENCOUNTER — Telehealth: Payer: Self-pay | Admitting: Cardiology

## 2009-05-10 ENCOUNTER — Ambulatory Visit: Payer: Self-pay | Admitting: Cardiology

## 2009-05-12 ENCOUNTER — Encounter: Payer: Self-pay | Admitting: Cardiology

## 2009-05-12 ENCOUNTER — Ambulatory Visit: Payer: Self-pay | Admitting: Cardiology

## 2009-05-12 ENCOUNTER — Encounter (INDEPENDENT_AMBULATORY_CARE_PROVIDER_SITE_OTHER): Payer: Self-pay | Admitting: *Deleted

## 2009-05-12 ENCOUNTER — Ambulatory Visit (HOSPITAL_COMMUNITY): Admission: RE | Admit: 2009-05-12 | Discharge: 2009-05-12 | Payer: Self-pay | Admitting: Cardiology

## 2009-05-12 ENCOUNTER — Ambulatory Visit: Payer: Self-pay

## 2009-05-19 ENCOUNTER — Telehealth (INDEPENDENT_AMBULATORY_CARE_PROVIDER_SITE_OTHER): Payer: Self-pay | Admitting: *Deleted

## 2009-05-26 ENCOUNTER — Telehealth (INDEPENDENT_AMBULATORY_CARE_PROVIDER_SITE_OTHER): Payer: Self-pay | Admitting: *Deleted

## 2009-05-27 ENCOUNTER — Ambulatory Visit: Payer: Self-pay

## 2009-06-02 ENCOUNTER — Encounter: Payer: Self-pay | Admitting: Internal Medicine

## 2009-06-02 ENCOUNTER — Telehealth: Payer: Self-pay | Admitting: Cardiology

## 2009-06-03 ENCOUNTER — Ambulatory Visit: Payer: Self-pay | Admitting: Internal Medicine

## 2009-06-04 ENCOUNTER — Telehealth: Payer: Self-pay | Admitting: Internal Medicine

## 2009-06-09 ENCOUNTER — Telehealth: Payer: Self-pay | Admitting: Cardiology

## 2009-06-10 ENCOUNTER — Ambulatory Visit: Payer: Self-pay | Admitting: Cardiology

## 2009-06-11 ENCOUNTER — Telehealth: Payer: Self-pay | Admitting: Cardiology

## 2009-06-14 ENCOUNTER — Ambulatory Visit: Payer: Self-pay | Admitting: Internal Medicine

## 2009-06-14 ENCOUNTER — Ambulatory Visit: Payer: Self-pay | Admitting: Cardiology

## 2009-06-15 LAB — CONVERTED CEMR LAB
BUN: 22 mg/dL (ref 6–23)
Calcium: 8.9 mg/dL (ref 8.4–10.5)
GFR calc non Af Amer: 27.74 mL/min (ref >60)
Glucose, Bld: 97 mg/dL (ref 70–99)

## 2009-06-24 ENCOUNTER — Ambulatory Visit: Payer: Self-pay | Admitting: Internal Medicine

## 2009-07-08 ENCOUNTER — Ambulatory Visit: Payer: Self-pay | Admitting: Cardiology

## 2009-07-08 LAB — CONVERTED CEMR LAB: POC INR: 3.2

## 2009-07-22 ENCOUNTER — Ambulatory Visit: Payer: Self-pay | Admitting: Cardiology

## 2009-07-22 LAB — CONVERTED CEMR LAB: POC INR: 3.6

## 2009-07-30 ENCOUNTER — Ambulatory Visit: Payer: Self-pay | Admitting: Cardiology

## 2009-08-05 ENCOUNTER — Ambulatory Visit: Payer: Self-pay | Admitting: Internal Medicine

## 2009-08-19 ENCOUNTER — Ambulatory Visit: Payer: Self-pay | Admitting: Internal Medicine

## 2009-08-19 LAB — CONVERTED CEMR LAB: POC INR: 3.3

## 2009-08-25 ENCOUNTER — Ambulatory Visit: Payer: Self-pay | Admitting: Internal Medicine

## 2009-09-02 ENCOUNTER — Ambulatory Visit: Payer: Self-pay | Admitting: Cardiovascular Disease

## 2009-09-02 LAB — CONVERTED CEMR LAB: POC INR: 4.6

## 2009-09-15 ENCOUNTER — Encounter: Admission: RE | Admit: 2009-09-15 | Discharge: 2009-09-15 | Payer: Self-pay | Admitting: Internal Medicine

## 2009-09-20 ENCOUNTER — Ambulatory Visit: Payer: Self-pay | Admitting: Cardiology

## 2009-09-20 ENCOUNTER — Ambulatory Visit: Payer: Self-pay | Admitting: Internal Medicine

## 2009-10-04 ENCOUNTER — Ambulatory Visit: Payer: Self-pay | Admitting: Internal Medicine

## 2009-10-04 ENCOUNTER — Telehealth: Payer: Self-pay | Admitting: Cardiology

## 2009-10-18 ENCOUNTER — Ambulatory Visit: Payer: Self-pay | Admitting: Cardiovascular Disease

## 2009-10-22 ENCOUNTER — Ambulatory Visit: Payer: Self-pay | Admitting: Internal Medicine

## 2009-10-22 DIAGNOSIS — J45909 Unspecified asthma, uncomplicated: Secondary | ICD-10-CM | POA: Insufficient documentation

## 2009-11-03 ENCOUNTER — Encounter: Admission: RE | Admit: 2009-11-03 | Discharge: 2009-11-03 | Payer: Self-pay | Admitting: Cardiology

## 2009-11-03 ENCOUNTER — Ambulatory Visit: Payer: Self-pay | Admitting: Cardiology

## 2009-11-08 ENCOUNTER — Ambulatory Visit: Payer: Self-pay | Admitting: Cardiology

## 2009-11-10 ENCOUNTER — Telehealth (INDEPENDENT_AMBULATORY_CARE_PROVIDER_SITE_OTHER): Payer: Self-pay | Admitting: *Deleted

## 2009-11-10 LAB — CONVERTED CEMR LAB
BUN: 26 mg/dL — ABNORMAL HIGH (ref 6–23)
GFR calc non Af Amer: 23.66 mL/min (ref >60)
Glucose, Bld: 83 mg/dL (ref 70–99)
Potassium: 4.1 meq/L (ref 3.5–5.1)

## 2009-11-29 ENCOUNTER — Ambulatory Visit: Payer: Self-pay | Admitting: Internal Medicine

## 2009-12-02 ENCOUNTER — Encounter: Payer: Self-pay | Admitting: Cardiology

## 2009-12-27 ENCOUNTER — Ambulatory Visit: Payer: Self-pay | Admitting: Internal Medicine

## 2010-01-20 ENCOUNTER — Ambulatory Visit: Admission: RE | Admit: 2010-01-20 | Discharge: 2010-01-20 | Payer: Self-pay | Source: Home / Self Care

## 2010-01-20 ENCOUNTER — Encounter: Payer: Self-pay | Admitting: Cardiology

## 2010-01-20 ENCOUNTER — Ambulatory Visit
Admission: RE | Admit: 2010-01-20 | Discharge: 2010-01-20 | Payer: Self-pay | Source: Home / Self Care | Attending: Cardiology | Admitting: Cardiology

## 2010-02-03 ENCOUNTER — Ambulatory Visit: Admission: RE | Admit: 2010-02-03 | Discharge: 2010-02-03 | Payer: Self-pay | Source: Home / Self Care

## 2010-02-03 ENCOUNTER — Encounter: Payer: Self-pay | Admitting: Cardiology

## 2010-02-13 ENCOUNTER — Encounter: Payer: Self-pay | Admitting: Internal Medicine

## 2010-02-16 ENCOUNTER — Ambulatory Visit: Admission: RE | Admit: 2010-02-16 | Discharge: 2010-02-16 | Payer: Self-pay | Source: Home / Self Care

## 2010-02-16 LAB — CONVERTED CEMR LAB: POC INR: 2.3

## 2010-02-20 LAB — CONVERTED CEMR LAB
ALT: 15 units/L (ref 0–35)
ALT: 22 units/L (ref 0–35)
AST: 15 units/L (ref 0–37)
AST: 20 units/L (ref 0–37)
AST: 25 units/L (ref 0–37)
Albumin: 4 g/dL (ref 3.5–5.2)
Alkaline Phosphatase: 74 units/L (ref 39–117)
BUN: 18 mg/dL (ref 6–23)
BUN: 24 mg/dL — ABNORMAL HIGH (ref 6–23)
BUN: 28 mg/dL — ABNORMAL HIGH (ref 6–23)
Basophils Absolute: 0.1 10*3/uL (ref 0.0–0.1)
Basophils Relative: 0.6 % (ref 0.0–3.0)
Basophils Relative: 1.1 % (ref 0.0–3.0)
Bilirubin, Direct: 0.1 mg/dL (ref 0.0–0.3)
Bilirubin, Direct: 0.1 mg/dL (ref 0.0–0.3)
CO2: 29 meq/L (ref 19–32)
Calcium: 8.8 mg/dL (ref 8.4–10.5)
Calcium: 9 mg/dL (ref 8.4–10.5)
Chloride: 108 meq/L (ref 96–112)
Cholesterol: 108 mg/dL (ref 0–200)
Creatinine, Ser: 1.5 mg/dL — ABNORMAL HIGH (ref 0.4–1.2)
Creatinine, Ser: 1.7 mg/dL — ABNORMAL HIGH (ref 0.4–1.2)
Creatinine, Ser: 2.1 mg/dL — ABNORMAL HIGH (ref 0.4–1.2)
Direct LDL: 33.7 mg/dL
Eosinophils Absolute: 0.1 10*3/uL (ref 0.0–0.7)
Eosinophils Absolute: 0.1 10*3/uL (ref 0.0–0.7)
Eosinophils Relative: 2.2 % (ref 0.0–5.0)
Eosinophils Relative: 2.4 % (ref 0.0–5.0)
Free T4: 1 ng/dL (ref 0.6–1.6)
Free T4: 1.06 ng/dL (ref 0.60–1.60)
GFR calc non Af Amer: 23.54 mL/min (ref >60)
GFR calc non Af Amer: 27.4 mL/min (ref >60)
GFR calc non Af Amer: 30.4 mL/min (ref >60)
Glucose, Bld: 102 mg/dL — ABNORMAL HIGH (ref 70–99)
HCT: 35 % — ABNORMAL LOW (ref 36.0–46.0)
Lymphocytes Relative: 18.9 % (ref 12.0–46.0)
Lymphocytes Relative: 19 % (ref 12.0–46.0)
Lymphs Abs: 1.1 10*3/uL (ref 0.7–4.0)
MCHC: 35 g/dL (ref 30.0–36.0)
MCHC: 35 g/dL (ref 30.0–36.0)
MCV: 99.4 fL (ref 78.0–100.0)
Monocytes Absolute: 0.5 10*3/uL (ref 0.1–1.0)
Monocytes Absolute: 0.5 10*3/uL (ref 0.1–1.0)
Monocytes Relative: 7.1 % (ref 3.0–12.0)
Neutro Abs: 4.2 10*3/uL (ref 1.4–7.7)
Neutrophils Relative %: 70.2 % (ref 43.0–77.0)
Neutrophils Relative %: 70.9 % (ref 43.0–77.0)
Platelets: 206 10*3/uL (ref 150.0–400.0)
Platelets: 231 10*3/uL (ref 150.0–400.0)
Potassium: 4 meq/L (ref 3.5–5.1)
Pro B Natriuretic peptide (BNP): 183 pg/mL — ABNORMAL HIGH (ref 0.0–100.0)
RBC: 3.52 M/uL — ABNORMAL LOW (ref 3.87–5.11)
RBC: 3.75 M/uL — ABNORMAL LOW (ref 3.87–5.11)
Total Bilirubin: 0.8 mg/dL (ref 0.3–1.2)
Total Bilirubin: 0.9 mg/dL (ref 0.3–1.2)
Total CHOL/HDL Ratio: 3
Total CHOL/HDL Ratio: 4
Total Protein: 6.9 g/dL (ref 6.0–8.3)
Triglycerides: 217 mg/dL — ABNORMAL HIGH (ref 0.0–149.0)
WBC: 5.9 10*3/uL (ref 4.5–10.5)
WBC: 6.5 10*3/uL (ref 4.5–10.5)

## 2010-02-24 NOTE — Progress Notes (Signed)
  Walk in Patient Form Recieved " May she Resume her Exercise Program?" Sent to message Nurse Erle Crocker  May 19, 2009 10:27 AM    Appended Document:  Talked with Tracey [name] about exercising this am.  She currently is walking around the block at her condo and says that she feels tired this week. She states she also feels "tired" after doing activities around the house this week.  She wanted to know if she could return to her exercise program of treadmill 10 minutes, exercise bike 10 minutes, and floor leg exercises.  I told her it would be better to wait until she had heard back from Dr. Waunita Schooner nurse, [name].  Also encouraged her to take it easy and shorten her walk  ... she carries a cell phone with her when she walks as she walks alone. Patient understands and will await return call.   Appended Document:  ok to resume exercise as tolerated  Appended Document:  pt aware

## 2010-02-24 NOTE — Procedures (Signed)
Summary: pcp/jss   Allergies: 1)  ! Demerol 2)  Codeine Phosphate (Codeine Phosphate) 3)  Morphine Sulfate (Morphine Sulfate)  Comments:  Provider: Altha Harm, LPN (May 27, 9560 10:31 AM)  PPM Specifications Following MD:  Hillis Range, MD     Referring MD:  Cecille Po Vendor:  [employer]     PPM Model Number:  [id number]     PPM Serial Number:  [id number] PPM DOI:  01/30/2007     PPM Implanting MD:  NOT IMPLANTED HERE  Lead 1    Location: [facility]     DOI: 01/30/2007     Model #: [id number]     Serial #: [id number]     Status: active Lead 2    Location: [facility]     DOI: 01/30/2007     Model #: [id number]     Serial #: [id number]     Status: active  PPM Follow Up Pacer Dependent:  No      Episodes Coumadin:  Yes  Parameters Mode:  MVP (R)     Lower Rate Limit:  60     Upper Rate Limit:  120 Paced AV Delay:  180     Sensed AV Delay:  150 Rate Response Parameters:  Rate response-8, Threshold-Med/low, Acceleration-30 seconds, Deceleration-5 min

## 2010-02-24 NOTE — Progress Notes (Signed)
  Phone Note Call from Patient   Caller: Patient Summary of Call: pt called because when she went to the pharm there was confusion about what dose of diltiazem the pt was currently taking. the pt states the last time she got diltiazem filled she had some of the old and had combined the two bottles and does not know if they were 180 or 240. the bottle she has at home says 180mg . spoke with dr Shirlee Latch and [facility], pt instructed to take diltiazem 180mg  twice daily. [name], RN  April 12, 2009 3:56 PM

## 2010-02-24 NOTE — Medication Information (Signed)
Summary: rov/tm  Anticoagulant Therapy  Managed by: Weston Brass, PharmD Referring MD: Jens Som MD, Arlys John PCP: Kirby Funk, MD Supervising MD: Riley Kill MD, Maisie Fus Indication 1: Atrial Fibrillation (ICD-427.31) Lab Used: [facility] Site: [facility] INR POC 2.3 INR RANGE 2 - 3  Dietary changes: no    Health status changes: no    Bleeding/hemorrhagic complications: no    Recent/future hospitalizations: yes       Details: Pt cracked her wrist and was taking Vicodin.    Any changes in medication regimen? no    Recent/future dental: no  Any missed doses?: yes     Details: Missed last Thursday's dose.     Allergies: 1)  ! Demerol 2)  Codeine Phosphate (Codeine Phosphate) 3)  Morphine Sulfate (Morphine Sulfate)  Anticoagulation Management History:      The patient is taking warfarin and comes in today for a routine follow up visit.  Positive risk factors for bleeding include an age of 75 years or older and presence of serious comorbidities.  The bleeding index is 'intermediate risk'.  Positive CHADS2 values include History of HTN and Age > 32 years old.  The start date was 06/16/2008.  Her last INR was 2.4 ratio.  Anticoagulation responsible provider: Riley Kill MD, Maisie Fus.  INR POC: 2.3.  Exp: 10/2010.    Anticoagulation Management Assessment/Plan:      The patient's current anticoagulation dose is Warfarin sodium 2 mg  tabs: Take as directed by coumadin clinic..  The target INR is 2.0-3.0.  The next INR is due 10/04/2009.  Anticoagulation instructions were given to patient.  Results were reviewed/authorized by Weston Brass, PharmD.  She was notified by Gweneth Fritter, PharmD Candidate.         Prior Anticoagulation Instructions: INR 4.6 Skip today, on Friday take 1/2 pill  then change dose to 1.5 pill everyday except 1 pill on Mondays, Wednesdays and Fridays. Recheck in 2 weeks.   Current Anticoagulation Instructions: INR 2.3  Continue taking 1.5 tablets (3mg ) every day except take  1 tablet (2mg ) on Mondays, Wednesdays, and Friday.  Recheck in 2 week.

## 2010-02-24 NOTE — Medication Information (Signed)
Summary: rov/sp  Anticoagulant Therapy  Managed by: Weston Brass, PharmD Referring MD: Jens Som MD, Arlys John PCP: Kirby Funk, MD Supervising MD: Tenny Craw MD, Gunnar Fusi Indication 1: Atrial Fibrillation (ICD-427.31) Lab Used: [facility] Site: [facility] INR POC 2.7 INR RANGE 2 - 3  Dietary changes: no    Health status changes: yes       Details: pt having more occurences of trembling.  Stated had a short episode of whole body shakes yesterday but did not last long.  PCP stated f/u with cardiology to help determine cause.  Will make Dr. Jens Som aware.   Bleeding/hemorrhagic complications: no    Recent/future hospitalizations: no    Any changes in medication regimen? no    Recent/future dental: no  Any missed doses?: no       Is patient compliant with meds? yes       Allergies: 1)  ! Demerol 2)  Codeine Phosphate (Codeine Phosphate) 3)  Morphine Sulfate (Morphine Sulfate)  Anticoagulation Management History:      The patient is taking warfarin and comes in today for a routine follow up visit.  Positive risk factors for bleeding include an age of 20 years or older and presence of serious comorbidities.  The bleeding index is 'intermediate risk'.  Positive CHADS2 values include History of HTN and Age > 26 years old.  The start date was 06/16/2008.  Her last INR was 2.4 ratio.  Anticoagulation responsible Wilder Amodei: Tenny Craw MD, Gunnar Fusi.  INR POC: 2.7.  Cuvette Lot#: [id number].  Exp: 12/2010.    Anticoagulation Management Assessment/Plan:      The patient's current anticoagulation dose is Warfarin sodium 2 mg  tabs: Take as directed by coumadin clinic..  The target INR is 2.0-3.0.  The next INR is due 01/26/2010.  Anticoagulation instructions were given to patient.  Results were reviewed/authorized by Weston Brass, PharmD.  She was notified by Weston Brass PharmD.         Prior Anticoagulation Instructions: INR 2.4  Continue same dose of 1 1/2 tablets every day except 1 tablet on Monday, Wednesday  and Friday.  Recheck INR in 4 weeks.   Current Anticoagulation Instructions: INR 2.7  Continue same dose of 1 1/2 tablets every day except 1 tablet on Monday, Wednesday and Friday.  Recheck INR in 4 weeks.

## 2010-02-24 NOTE — Progress Notes (Signed)
  Phone Note Outgoing Call   Call placed by: Scherrie Bateman, LPN,  November 10, 2009 4:14 PM Call placed to: Patient Summary of Call: PT AWARE OF LAB RESULTS  AND THE NEED TO F/U  WITH NEPHROLOGY    LEFT MESSAGE AT [facility] TO SEE IF CONSULT HAD BEEN SET UP FOR PT PER DR CRENSHAW. Initial call taken by: Scherrie Bateman, LPN,  December 03, 2009 10:08 AM  Follow-up for Phone Call        Phone call completed SPOKE WITH LYNN AT [facility]  WILL BE CALLLING PT IN THE NEXT COUPLE OF WEEKS WITH APPT WILL SEE IN JAN 2012 SOMETIME NOT CRITICAL PER MD. PT AWARE. C/O  TREMORS THINKS MAY BE COMING FROM AMIODARONE  PER PT WILL MONITOR IF WORSENS WILL CALL FOR  RECOMMMEDATIONS. Follow-up by: Scherrie Bateman, LPN,  December 03, 2009 3:31 PM

## 2010-02-24 NOTE — Progress Notes (Signed)
----   Converted from flag ---- ---- 04/07/2009 2:04 PM, Cloyde Reams RN wrote: Pt was seen today in coumadin clinic.  Pt reports elevated BP's x several weeks.  BP at home in am after waking 170's/88-90's.  BP checked today in office at visit 174/84.  Pt states her BP med was recently changed by Dr Jens Som and has been running high since change made. ------------------------------  Phone Note From Other Clinic   Summary of Call: at last office visit metoprolol tart increased to 75mg  twice daily. will foward for dr Jens Som review [name], RN  April 07, 2009 2:22 PM\par  Follow-up for Phone Call        increase lopressor to 100 mg by mouth two times a day and follow BP Ferman Hamming, MD, [facility]  April 07, 2009 6:00 PM  spoke with pt, she is aware to increase metoprolol to 100mg  twice daily. she also states she is back in atrial fib. her heart rate is 105 at present. she will take the extra 1/2 of the metoprolol to equal 100mg  and call back if that doesn't improve her heart rate. she feels fine otherwise. [name], RN  April 09, 2009 8:48 AM    Additional Follow-up for Phone Call Additional follow up Details #1::        spoke with pt, she is feeling alot better. pulse is 66. dr Jens Som aware of above, will cont the same for now [name], RN  April 09, 2009 5:09 PM

## 2010-02-24 NOTE — Cardiovascular Report (Signed)
Summary: Office Visit   Office Visit   Imported By: Roderic Ovens 06/25/2009 09:50:30  _____________________________________________________________________  External Attachment:    Type:   Image     Comment:   External Document

## 2010-02-24 NOTE — Medication Information (Signed)
Summary: cvrr/ok per sally/lg  Anticoagulant Therapy  Managed by: Bethena Midget, RN, BSN Referring MD: Jens Som MD, Arlys John PCP: Kirby Funk, MD Supervising MD: Johney Frame MD, Fayrene Fearing Indication 1: Atrial Fibrillation (ICD-427.31) Lab Used: [facility] Site: [facility] INR POC 2.7 INR RANGE 2 - 3  Dietary changes: no    Health status changes: yes       Details: Occ Palpitaitions  Bleeding/hemorrhagic complications: no    Recent/future hospitalizations: no    Any changes in medication regimen? no    Recent/future dental: no  Any missed doses?: no       Is patient compliant with meds? yes      Comments: Seeing Dr Johney Frame today.   Current Medications (verified): 1)  Valium 5 Mg  Tabs (Diazepam) .... Take 1/2 To 1 Daily As Needed 2)  Pantoprazole Sodium 40 Mg  Tbec (Pantoprazole Sodium) .... Once Daily 3)  Folic Acid 1 Mg Tabs (Folic Acid) .... Take Two Times A Day 4)  Lipitor 40 Mg  Tabs (Atorvastatin Calcium) .... At Bedtime 5)  Warfarin Sodium 2 Mg  Tabs (Warfarin Sodium) .... Take As Directed By Coumadin Clinic. 6)  Metoprolol Tartrate 100 Mg Tabs (Metoprolol Tartrate) .... Take One Tablet By Mouth Twice A Day 7)  Ocuvite-Lutein   Caps (Multiple Vitamins-Minerals) .... Once Daily 8)  Fluticasone Propionate 50 Mcg/act Susp (Fluticasone Propionate) .Marland Kitchen.. 1-2 Sprays Each Nostril Once Daily 9)  Diltiazem Hcl Er Beads 180 Mg Xr24h-Cap (Diltiazem Hcl Er Beads) .Marland Kitchen.. 1 Tab Po Two Times A Day 10)  Furosemide 20 Mg Tabs (Furosemide) .... Take One Tablet By Mouth Daily. 11)  Whole Mega- Fish Oil .Marland Kitchen.. 1 Tab By Mouth Two Times A Day 12)  Aspirin 81 Mg  Tabs (Aspirin) .Marland Kitchen.. 1 Tab By Mouth Once Daily 13)  Potassium Chloride Crys Cr 20 Meq Cr-Tabs (Potassium Chloride Crys Cr) .... Take One Tablet By Mouth Daily 14)  Diltiazem Hcl Er Beads 240 Mg Xr24h-Cap (Diltiazem Hcl Er Beads) .... Take One Capsule By Mouth Every Morning 15)  B Complex Vitamins Plus  Tabs (Multiple Vitamins-Minerals) ....  Take Daily  Allergies: 1)  ! Demerol 2)  Codeine Phosphate (Codeine Phosphate) 3)  Morphine Sulfate (Morphine Sulfate)  Anticoagulation Management History:      The patient is taking warfarin and comes in today for a routine follow up visit.  Positive risk factors for bleeding include an age of 8 years or older and presence of serious comorbidities.  The bleeding index is 'intermediate risk'.  Positive CHADS2 values include History of HTN and Age > 62 years old.  The start date was 06/16/2008.  Her last INR was 2.4 ratio.  Anticoagulation responsible provider: Sherie Dobrowolski MD, Fayrene Fearing.  INR POC: 2.7.  Cuvette Lot#: [id number].  Exp: 08/2010.    Anticoagulation Management Assessment/Plan:      The patient's current anticoagulation dose is Warfarin sodium 2 mg  tabs: Take as directed by coumadin clinic..  The target INR is 2.0-3.0.  The next INR is due 07/01/2009.  Anticoagulation instructions were given to patient.  Results were reviewed/authorized by Bethena Midget, RN, BSN.  She was notified by Bethena Midget, RN, BSN.         Prior Anticoagulation Instructions: INR 2.1  Continue on same dosage 2 tablets daily except 1.5 tablets on Mondays and Fridays.  Recheck in 4 weeks.    Current Anticoagulation Instructions: INR 2.7 Continue 4mg s everyday except 3mg s on Mondays and Fridays. Recheck in 4 weeks.

## 2010-02-24 NOTE — Assessment & Plan Note (Signed)
Summary: 2 month follow up/kcw   Copy to:  Tracey Millers, MD Primary [name]/Referring Rich Paprocki:  Kirby Funk, MD  CC:  2 month follow up visit-still having nasal drainage in throat and stopped up nose.Tracey [name]  History of Present Illness: History of Present Illness: April 08, 2009- History of Present Illness:  77 yoF self referred with concern about allergy which she says has bothered her for years. In the 1960's she remembers allergy vaccine seeming to help her, with Dr Rema Jasmine.She also got habituated to Afrin at that time with difficulty quitting. No hx of asthma. She has gotten worse again in recent years. She wakes with nasal congestion, sneeze, ears stopped up. This morning she spit out some red blood- uncerftain source. Mild wheeze. Usually has perennial nasal congestion with postnasal drip. Had tonisllectomy and "sinus surgery" Questions food allergy or intolerance- gluten may cause gas, milk gives diarrhea.  June 24, 2009- Allergic rhinitis For allergy testing. Denies blood, pain or purulence. IgE 1.5, specific Neg, Food panel neg Nasal stuffiness intermittent. Fluticasone spray seems to help. Not using antihistamines. Last skin tested in 1960's. Has avoided antihistamines because she thought they were bad for her heart- she was confusing decongestants. 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 Skin test- Mostly low level reactions, strongest for cat  August 25, 2009- Allergic rhinitis Skin tests were not strong last visit. Noting nasal congestion  and postnasal drip. Not blowing out much. Eyes are burning some now. Ears ok. Occasionally a little wheeze. Walking dog makes chest and stomach "heavy". Complains of a globus sensation that won't go down. Doesn't use fluticasone which makes her head feel funny. Being injected for macular degeneration.   Preventive Screening-Counseling & Management  Alcohol-Tobacco     Smoking Status: never  Current  Medications (verified): 1)  Valium 5 Mg  Tabs (Diazepam) .... Take 1/2 To 1 Daily As Needed 2)  Pantoprazole Sodium 40 Mg  Tbec (Pantoprazole Sodium) .... Once Daily 3)  Folic Acid 1 Mg Tabs (Folic Acid) .... Take Two Times A Day 4)  Lipitor 40 Mg  Tabs (Atorvastatin Calcium) .... At Bedtime 5)  Warfarin Sodium 2 Mg  Tabs (Warfarin Sodium) .... Take As Directed By Coumadin Clinic. 6)  Metoprolol Tartrate 100 Mg Tabs (Metoprolol Tartrate) .... Take One Tablet By Mouth Twice A Day 7)  Ocuvite-Lutein   Caps (Multiple Vitamins-Minerals) .... Once Daily 8)  Fluticasone Propionate 50 Mcg/act Susp (Fluticasone Propionate) .Tracey [name].. 1-2 Sprays Each Nostril Once Daily 9)  Furosemide 40 Mg Tabs (Furosemide) .... Take One Tablet By Mouth Daily. 10)  Whole Mega- Fish Oil .Tracey [name].. 1 Tab By Mouth Two Times A Day 11)  Aspirin 81 Mg  Tabs (Aspirin) .Tracey [name].. 1 Tab By Mouth Once Daily 12)  Potassium Chloride Crys Cr 20 Meq Cr-Tabs (Potassium Chloride Crys Cr) .... Take One Tablet By Mouth Daily 13)  Amiodarone Hcl 200 Mg Tabs (Amiodarone Hcl) .... Take One Tablet By Mouth Once Daily 14)  Climara 0.05 Mg/24hr Ptwk (Estradiol) .... Uad  Allergies (verified): 1)  ! Demerol 2)  Codeine Phosphate (Codeine Phosphate) 3)  Morphine Sulfate (Morphine Sulfate)  Past History:  Past Medical History: Last updated: 05/10/2009 CORONARY ARTERY DISEASE (ICD-414.00) HYPERTENSION (ICD-401.9) HYPERLIPIDEMIA (ICD-272.4) PAROXYSMAL ATRIAL FIBRILLATION, HX OF (ICD-V12.59) MYOCARDIAL INFARCTION, HX OF (ICD-412) ANEMIA, IRON DEFICIENCY, HX OF (ICD-V12.3) RENAL INSUFFICIENCY (ICD-588.9) GERD (ICD-530.81) PANIC DISORDER (ICD-300.01) ABDOMINAL PAIN, CHRONIC (ICD-789.00) IRRITABLE BOWEL SYNDROME (ICD-564.1) CEREBRAL ANEURYSM (ICD-437.3) DIVERTICULOSIS, COLON (ICD-562.10) ARTHRITIS (ICD-716.90)  FECAL INCONTINENCE (ICD-787.6) H/O goiter H/O pacemker for sick sinus syndrome/ tachycardia-bradycardia syndrome Aortic insufficiency  Past  Surgical History: Last updated: 07/29/2008 cholecystectomy hysterectomy surgery for subdural hematoma-Aug. 2007 Tonsillectomy Appendectomy PPM implanted by Dr Jenne Campus 1/09  Family History: Last updated: 06/16/2008 Family History of Ovarian Cancer: Family History of Uterine Cancer: Family History of Colon Polyps: Family History of Diabetes:  4 siblings with heart disease:   Social History: Last updated: 05/10/2009 Patient is widowed.  Lives in [place], [place]. 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   Risk Factors: Smoking Status: never (08/25/2009)  Review of Systems      See HPI       The patient complains of shortness of breath with activity, nasal congestion/difficulty breathing through nose, and sneezing.  The patient denies shortness of breath at rest, productive cough, non-productive cough, coughing up blood, chest pain, irregular heartbeats, acid heartburn, indigestion, loss of appetite, weight change, abdominal pain, difficulty swallowing, sore throat, tooth/dental problems, and headaches.    Vital Signs:  Patient profile:   77 year old female Height:      64 inches Weight:      148.50 pounds BMI:     25.58 O2 Sat:      95 % on Room air Pulse rate:   73 / minute BP sitting:   140 / 76  (left arm) Cuff size:   regular  Vitals Entered By: Reynaldo Minium CMA (August 25, 2009 10:47 AM)  O2 Flow:  Room air CC: 2 month follow up visit-still having nasal drainage in throat and stopped up nose.   Physical Exam  Additional Exam:  General: A/Ox3; pleasant and cooperative, NAD,  SKIN: no rash, lesions NODES: no lymphadenopathy HEENT: [place]/AT, EOM- WNL, Conjuctivae- clear, PERRLA, TM-WNL, Nose- wet  sniffing again, Throat- clear and wnl, Mallampati  II, hoarse NECK: Supple w/ fair ROM, JVD- none, normal carotid impulses w/o bruits Thyroid-  CHEST: Clear to P&A HEART: RRR, no m/g/r heard ABDOMEN: Soft and nl; ZOX:WRUE, nl  pulses, no edema  NEURO: Grossly intact to observation      Impression & Recommendations:  Problem # 1:  RHINITIS (ICD-472.0)  We will let her try  an antihistamine eyedrop if ok with her Opthalmologist, and I discussed encasings. If those don't help enough, she wants to try allergy vaccine. We began a preliminary risk/ benefit discussion of allergy vaccine and build-up techniques.  Other Orders: Est. Patient Level III (45409)  Patient Instructions: 1)  Please schedule a follow-up appointment in 1 month. 2)  Try sample Pataday antihistamine eyedrop, 1 drop daily in each eye, if ok with your eye doctor. 3)  Try putting encasings on your pillow and mattress for dust mites.  4)  When you come back if we need to we can talk about allergy shots.

## 2010-02-24 NOTE — Medication Information (Signed)
Summary: rov/tm  Anticoagulant Therapy  Managed by: Weston Brass, PharmD Referring MD: Jens Som MD, Arlys John PCP: Kirby Funk, MD Supervising MD: Excell Seltzer MD, Casimiro Needle Indication 1: Atrial Fibrillation (ICD-427.31) Lab Used: [facility] Site: [facility] INR POC 2.2 INR RANGE 2 - 3  Dietary changes: no    Health status changes: no    Bleeding/hemorrhagic complications: no    Recent/future hospitalizations: no    Any changes in medication regimen? no    Recent/future dental: no  Any missed doses?: no       Is patient compliant with meds? yes       Allergies: 1)  ! Demerol 2)  Codeine Phosphate (Codeine Phosphate) 3)  Morphine Sulfate (Morphine Sulfate)  Anticoagulation Management History:      The patient is taking warfarin and comes in today for a routine follow up visit.  Positive risk factors for bleeding include an age of 74 years or older and presence of serious comorbidities.  The bleeding index is 'intermediate risk'.  Positive CHADS2 values include History of HTN and Age > 19 years old.  The start date was 06/16/2008.  Her last INR was 2.4 ratio.  Anticoagulation responsible Shataya Winkles: Excell Seltzer MD, Casimiro Needle.  INR POC: 2.2.  Cuvette Lot#: [id number].  Exp: 11/2010.    Anticoagulation Management Assessment/Plan:      The patient's current anticoagulation dose is Warfarin sodium 2 mg  tabs: Take as directed by coumadin clinic..  The target INR is 2.0-3.0.  The next INR is due 11/08/2009.  Anticoagulation instructions were given to patient.  Results were reviewed/authorized by Weston Brass, PharmD.  She was notified by Harrel Carina, PharmD candidate.         Prior Anticoagulation Instructions: INR 3.3 Skip today's dose then resume 3mg s everyday except 2mg s on Mondays, Wednesdays and Fridays. Recheck in 2 weeks.   Current Anticoagulation Instructions: INR 2.2  Continue taking 1 1/2 tablets everyday except take 1 tablet on Mondays, Wednesdays, and Fridays. Re-check INR in 3  weeks.

## 2010-02-24 NOTE — Progress Notes (Signed)
Summary: Pt in a-fib  Phone Note Call from Patient Call back at Home Phone [phone]   Caller: Patient Summary of Call: Pt in a-fib Initial call taken by: Judie Grieve,  April 12, 2009 8:19 AM  Follow-up for Phone Call        spoke with pt, she cont to be in atrial fib. her bp today is 157/102 and her pulse is 66-100. she states anytime she is up and around her heart rate goes up. she is having no chest pain or SOB, she just dosen't feel well. will discuss with dr Shirlee Latch (DOD) [name], RN  April 12, 2009 11:33 AM   Additional Follow-up for Phone Call Additional follow up Details #1::        discussed with dr Shirlee Latch, pt instructed to increase diltiazem to 360mg  once daily. pt will call if she cont to feel bad [name], RN  April 12, 2009 11:34 AM     New/Updated Medications: METOPROLOL TARTRATE 100 MG TABS (METOPROLOL TARTRATE) Take one tablet by mouth twice a day DILTIAZEM HCL 120 MG TABS (DILTIAZEM HCL) one tablet by mouth once daily Prescriptions: METOPROLOL TARTRATE 100 MG TABS (METOPROLOL TARTRATE) Take one tablet by mouth twice a day  #60 x 12   Entered by:   [name], RN   Authorized by:   Ferman Hamming, MD, [facility]   Signed by:   [name], RN on 04/12/2009   Method used:   Electronically to        [facility]       [address]       [place], [place]  [address]       Ph: [phone] or [phone]       Fax: [phone]   RxID:   [id number] DILTIAZEM HCL 120 MG TABS (DILTIAZEM HCL) one tablet by mouth once daily  #30 x 12   Entered by:   [name], RN   Authorized by:   Ferman Hamming, MD, [facility]   Signed by:   [name], RN on 04/12/2009   Method used:   Electronically to        [facility] (retail)       [address]       [place], [place]  [address]       Ph: [phone] or [phone]       Fax: [phone]   RxID:   [id number]

## 2010-02-24 NOTE — Medication Information (Signed)
Summary: rov/ewj  Anticoagulant Therapy  Managed by: Cloyde Reams, RN, BSN Referring MD: Jens Som MD, Arlys John PCP: Kirby Funk, MD Supervising MD: Johney Frame MD, Fayrene Fearing Indication 1: Atrial Fibrillation (ICD-427.31) Lab Used: [facility] Site: [facility] INR POC 2.4 INR RANGE 2 - 3  Dietary changes: yes       Details: Decr amt of vit K.  Health status changes: yes       Details: Pt states BP has been running 170's/88 or 90 in am when awakens.  Pt concerned BP medication not working.    Bleeding/hemorrhagic complications: no    Recent/future hospitalizations: no    Any changes in medication regimen? no    Recent/future dental: no  Any missed doses?: no       Is patient compliant with meds? yes      Comments: BP checked today in office 174/84.  Allergies: 1)  ! Codeine 2)  ! Demerol 3)  ! Morphine 4)  Codeine Phosphate (Codeine Phosphate) 5)  Morphine Sulfate (Morphine Sulfate)  Anticoagulation Management History:      The patient is taking warfarin and comes in today for a routine follow up visit.  Positive risk factors for bleeding include an age of 75 years or older.  The bleeding index is 'intermediate risk'.  Positive CHADS2 values include History of HTN and Age > 75 years old.  The start date was 06/16/2008.  Anticoagulation responsible provider: Kristoph Sattler MD, Fayrene Fearing.  INR POC: 2.4.  Cuvette Lot#: [id number].  Exp: 05/2010.    Anticoagulation Management Assessment/Plan:      The patient's current anticoagulation dose is Warfarin sodium 2 mg  tabs: Take as directed by coumadin clinic..  The target INR is 2.0-3.0.  The next INR is due 05/05/2009.  Anticoagulation instructions were given to patient.  Results were reviewed/authorized by Cloyde Reams, RN, BSN.  She was notified by Cloyde Reams RN.         Prior Anticoagulation Instructions: INR 2.8  Continue on same dosage 2 tablets daily except 1.5 tablets on Mondays and Fridays.  Recheck in 3 weeks.    Current  Anticoagulation Instructions: INR 2.4  Continue on same dosage 2 tablets daily except 1.5 tablets on Mondays and Fridays.  Recheck in 4 weeks.

## 2010-02-24 NOTE — Progress Notes (Signed)
Summary: calling back  Phone Note Call from Patient Call back at Home Phone [phone]   Caller: Patient Reason for Call: Talk to Nurse Summary of Call: calling back to speak with debra  Initial call taken by: Lorne Skeens,  Jun 11, 2009 9:19 AM  Follow-up for Phone Call        06/11/09--0940--spoke with pt and gave instructions concerning increasing lasix--pt agrees but states has an appoint with dr Johney Frame on monday 06/14/09 and does not want to come back twice in one week--also has cv[facility] appoint on monday--advised--please keep appoint with dr allred and we will get bmet that day if she agrees to increase lasix next 4 days--nt Follow-up by: Ledon Snare, RN,  Jun 11, 2009 9:48 AM

## 2010-02-24 NOTE — Assessment & Plan Note (Signed)
Summary: 3:45/PER CHECK OUT/SF   Referring Provider:  [name], MD Primary Provider:  Kirby Funk, MD  CC:  sob and fatigue.  History of Present Illness: Pleasant female with a past medical history of paroxysmal atrial fibrillation, status post pacemaker placement due to tachybradycardia syndrome, coronary artery disease with prior PCI of the right coronary artery in 2007 with drug-eluting stents.  A myoview in November of 2010 showed an ejection fraction of 57%. There was possible mild anterior ischemia but felt more likely to be probable soft tissue attenuation. We are treating medically. Echocardiogram performed in April of 2011 showed normal LV function, elevated left ventricular filling pressure , mild to moderate aortic insufficiency, mild mitral regurgitation and mild biatrial enlargement. I last saw her in April of 2011. Since then she was seen by Dr. Johney Frame and placed on amiodarone for paroxysmal atrial fibrillation. Since then she has some dyspnea on exertion relieved with rest. There is no associated chest pain. There is no orthopnea, PND or pedal edema. There is no syncope or bleeding  Current Medications (verified): 1)  Valium 5 Mg  Tabs (Diazepam) .... Take 1/2 To 1 Daily As Needed 2)  Pantoprazole Sodium 40 Mg  Tbec (Pantoprazole Sodium) .... Once Daily 3)  Folic Acid 1 Mg Tabs (Folic Acid) .... Take Two Times A Day 4)  Lipitor 40 Mg  Tabs (Atorvastatin Calcium) .... At Bedtime 5)  Warfarin Sodium 2 Mg  Tabs (Warfarin Sodium) .... Take As Directed By Coumadin Clinic. 6)  Metoprolol Tartrate 100 Mg Tabs (Metoprolol Tartrate) .... Take One Tablet By Mouth Twice A Day 7)  Ocuvite-Lutein   Caps (Multiple Vitamins-Minerals) .... Once Daily 8)  Fluticasone Propionate 50 Mcg/act Susp (Fluticasone Propionate) .Marland Kitchen.. 1-2 Sprays Each Nostril Once Daily 9)  Furosemide 40 Mg Tabs (Furosemide) .... Take One Tablet By Mouth Daily. 10)  Whole Mega- Fish Oil .Marland Kitchen.. 1 Tab By Mouth Two Times A  Day 11)  Aspirin 81 Mg  Tabs (Aspirin) .Marland Kitchen.. 1 Tab By Mouth Once Daily 12)  Potassium Chloride Crys Cr 20 Meq Cr-Tabs (Potassium Chloride Crys Cr) .... Take One Tablet By Mouth Daily 13)  Amiodarone Hcl 200 Mg Tabs (Amiodarone Hcl) .... Take One Tablet By Mouth Once Daily 14)  Climara 0.05 Mg/24hr Ptwk (Estradiol) .... Uad  Allergies: 1)  ! Demerol 2)  Codeine Phosphate (Codeine Phosphate) 3)  Morphine Sulfate (Morphine Sulfate)  Past History:  Past Medical History: Reviewed history from 05/10/2009 and no changes required. CORONARY ARTERY DISEASE (ICD-414.00) HYPERTENSION (ICD-401.9) HYPERLIPIDEMIA (ICD-272.4) PAROXYSMAL ATRIAL FIBRILLATION, HX OF (ICD-V12.59) MYOCARDIAL INFARCTION, HX OF (ICD-412) ANEMIA, IRON DEFICIENCY, HX OF (ICD-V12.3) RENAL INSUFFICIENCY (ICD-588.9) GERD (ICD-530.81) PANIC DISORDER (ICD-300.01) ABDOMINAL PAIN, CHRONIC (ICD-789.00) IRRITABLE BOWEL SYNDROME (ICD-564.1) CEREBRAL ANEURYSM (ICD-437.3) DIVERTICULOSIS, COLON (ICD-562.10) ARTHRITIS (ICD-716.90) FECAL INCONTINENCE (ICD-787.6) H/O goiter H/O pacemker for sick sinus syndrome/ tachycardia-bradycardia syndrome Aortic insufficiency  Past Surgical History: Reviewed history from 07/29/2008 and no changes required. cholecystectomy hysterectomy surgery for subdural hematoma-Aug. 2007 Tonsillectomy Appendectomy PPM implanted by Dr Jenne Campus 1/09  Social History: Reviewed history from 05/10/2009 and no changes required. Patient is widowed.  Lives in [place], [place]. 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   Review of Systems       Some fatigue but no fevers or chills, productive cough, hemoptysis, dysphasia, odynophagia, melena, hematochezia, dysuria, hematuria, rash, seizure activity, orthopnea, PND,  claudication. Remaining systems are negative.   Vital Signs:  Patient profile:   77 year old  female Height:      64 inches Weight:      148 pounds BMI:      25.50 Pulse rate:   64 / minute Resp:     14 per minute BP sitting:   130 / 80  (left arm)  Vitals Entered By: Kem Parkinson (July 30, 2009 3:21 PM)  Physical Exam  General:  Well-developed well-nourished in no acute distress.  Skin is warm and dry.  HEENT is normal.  Neck is supple. No thyromegaly.  Chest is clear to auscultation with normal expansion.  Cardiovascular exam is regular rate and rhythm.  Abdominal exam nontender or distended. No masses palpated. Extremities show no edema. neuro grossly intact    EKG  Procedure date:  07/30/2009  Findings:      Atrial paced rhythm. Right bundle branch block. Left anterior fascicular block.  PPM Specifications Following MD:  Hillis Range, MD     Referring MD:  Cecille Po Vendor:  [employer]     PPM Model Number:  [id number]     PPM Serial Number:  [id number] PPM DOI:  01/30/2007     PPM Implanting MD:  NOT IMPLANTED HERE  Lead 1    Location: [facility]     DOI: 01/30/2007     Model #: [id number]     Serial #: [id number]     Status: active Lead 2    Location: [facility]     DOI: 01/30/2007     Model #: [id number]     Serial #: [id number]     Status: active  PPM Follow Up Pacer Dependent:  No      Episodes Coumadin:  Yes  Parameters Mode:  MVP (R)     Lower Rate Limit:  60     Upper Rate Limit:  120 Paced AV Delay:  180     Sensed AV Delay:  150 Rate Response Parameters:  Rate response-8, Threshold-Med/low, Acceleration-30 seconds, Deceleration-5 min  Impression & Recommendations:  Problem # 1:  DYSPNEA (ICD-786.05) Most likely secondary to diastolic dysfunction. She is euvolemic on examination. Continue present dose of Lasix. Her updated medication list for this problem includes:    Metoprolol Tartrate 100 Mg Tabs (Metoprolol tartrate) .Marland Kitchen... Take one tablet by mouth twice a day    Furosemide 40 Mg Tabs (Furosemide) .Marland Kitchen... Take one tablet by mouth daily.    Aspirin 81 Mg Tabs (Aspirin) .Marland Kitchen... 1 tab by mouth once daily  Problem # 2:  ATRIAL  FIBRILLATION (ICD-427.31) Remains in sinus rhythm. Continue amiodarone and Coumadin. When she returns in 3 months check liver functions, chest x-ray and TSH. Her updated medication list for this problem includes:    Warfarin Sodium 2 Mg Tabs (Warfarin sodium) .Marland Kitchen... Take as directed by coumadin clinic.    Metoprolol Tartrate 100 Mg Tabs (Metoprolol tartrate) .Marland Kitchen... Take one tablet by mouth twice a day    Aspirin 81 Mg Tabs (Aspirin) .Marland Kitchen... 1 tab by mouth once daily    Amiodarone Hcl 200 Mg Tabs (Amiodarone hcl) .Marland Kitchen... Take one tablet by mouth once daily  Problem # 3:  PACEMAKER, PERMANENT (ICD-V45.01) Management per EP.  Problem # 4:  COUMADIN THERAPY (ICD-V58.61) Followed in the Coumadin clinic. Goal INR 2-3.  Problem # 5:  CORONARY ARTERY DISEASE (ICD-414.00) Continued aspirin, beta blocker and statin. Her updated medication list for this problem includes:    Warfarin Sodium 2 Mg Tabs (Warfarin sodium) .Marland Kitchen... Take as directed by coumadin clinic.    Metoprolol Tartrate 100  Mg Tabs (Metoprolol tartrate) .Marland Kitchen... Take one tablet by mouth twice a day    Aspirin 81 Mg Tabs (Aspirin) .Marland Kitchen... 1 tab by mouth once daily  Problem # 6:  HYPERTENSION (ICD-401.9) Blood pressure controlled on present medications. Will continue. Her updated medication list for this problem includes:    Metoprolol Tartrate 100 Mg Tabs (Metoprolol tartrate) .Marland Kitchen... Take one tablet by mouth twice a day    Furosemide 40 Mg Tabs (Furosemide) .Marland Kitchen... Take one tablet by mouth daily.    Aspirin 81 Mg Tabs (Aspirin) .Marland Kitchen... 1 tab by mouth once daily  Problem # 7:  HYPERLIPIDEMIA (ICD-272.4) Continue statin. Her updated medication list for this problem includes:    Lipitor 40 Mg Tabs (Atorvastatin calcium) .Marland Kitchen... At bedtime  Problem # 8:  RENAL INSUFFICIENCY (ICD-588.9)  Problem # 9:  IRRITABLE BOWEL SYNDROME (ICD-564.1)  Patient Instructions: 1)  Your physician recommends that you schedule a follow-up appointment in: 3 months

## 2010-02-24 NOTE — Medication Information (Signed)
Summary: rov/tm  Anticoagulant Therapy  Managed by: Windell Hummingbird, RN Referring MD: Jens Som MD, Arlys John PCP: Kirby Funk, MD Supervising MD: Eden Emms MD, Theron Arista Indication 1: Atrial Fibrillation (ICD-427.31) Lab Used: [facility] Site: [facility] INR POC 2.3 INR RANGE 2 - 3  Dietary changes: no    Health status changes: no    Bleeding/hemorrhagic complications: no    Recent/future hospitalizations: no    Any changes in medication regimen? no    Recent/future dental: no  Any missed doses?: no       Is patient compliant with meds? yes       Allergies: 1)  ! Demerol 2)  Codeine Phosphate (Codeine Phosphate) 3)  Morphine Sulfate (Morphine Sulfate)  Anticoagulation Management History:      The patient is taking warfarin and comes in today for a routine follow up visit.  Positive risk factors for bleeding include an age of 75 years or older and presence of serious comorbidities.  The bleeding index is 'intermediate risk'.  Positive CHADS2 values include History of HTN and Age > 75 years old.  The start date was 06/16/2008.  Her last INR was 2.4 ratio.  Anticoagulation responsible provider: Eden Emms MD, Theron Arista.  INR POC: 2.3.  Cuvette Lot#: [id number].  Exp: 01/2011.    Anticoagulation Management Assessment/Plan:      The patient's current anticoagulation dose is Warfarin sodium 2 mg  tabs: Take as directed by coumadin clinic..  The target INR is 2.0-3.0.  The next INR is due 03/16/2010.  Anticoagulation instructions were given to patient.  Results were reviewed/authorized by Windell Hummingbird, RN.  She was notified by Windell Hummingbird, RN.         Prior Anticoagulation Instructions: INR 2.0 Change dose to  1.5 pills everyday except 1 pill on Mondays. Recheck in 2 weeks.   Current Anticoagulation Instructions: INR 2.3 Continue with 1.5 tablets every day except Monday take 1 tablet. Recheck in 4 weeks.

## 2010-02-24 NOTE — Assessment & Plan Note (Signed)
Summary: rov resch due to pt breaking her arm/apc   Copy to:  [name], MD Primary Provider/Referring Provider:  Kirby Funk, MD  CC:  Follow up visit-nasal congestion, coughing, and hoarseness.Marland Kitchen  History of Present Illness: June 24, 2009- Allergic rhinitis For allergy testing. Denies blood, pain or purulence. IgE 1.5, specific Neg, Food panel neg Nasal stuffiness intermittent. Fluticasone spray seems to help. Not using antihistamines. Last skin tested in 1960's. Has avoided antihistamines because she thought they were bad for her heart- she was confusing decongestants. 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 Skin test- Mostly low level reactions, strongest for cat  August 25, 2009- Allergic rhinitis Skin tests were not strong last visit. Noting nasal congestion  and postnasal drip. Not blowing out much. Eyes are burning some now. Ears ok. Occasionally a little wheeze. Walking dog makes chest and stomach "heavy". Complains of a globus sensation that won't go down. Doesn't use fluticasone which makes her head feel funny. Being injected for macular degeneration.  October 22, 2009- Allergic rhinitis, asthma, Afib/ pacemaker For 2 weeks has noted increased nasal drainage, postnasal drip and some wheeze. No recent heart med changes- she says she hasn't felt well on amiodarone. These complaints are similar to what she described here in early August and she dates it back to start of amiodarone in early May. Otc antihistamine didn't help. She has fluticasone not used recently.   Asthma History    Initial Asthma Severity Rating:    Age range: 12+ years    Symptoms: 0-2 days/week    Nighttime Awakenings: 0-2/month    Interferes w/ normal activity: no limitations    SABA use (not for EIB): 0-2 days/week    Asthma Severity Assessment: Intermittent   Preventive Screening-Counseling & Management  Alcohol-Tobacco     Smoking Status: never  Current  Medications (verified): 1)  Valium 5 Mg  Tabs (Diazepam) .... Take 1/2 To 1 Daily As Needed 2)  Pantoprazole Sodium 40 Mg  Tbec (Pantoprazole Sodium) .... Once Daily 3)  Folic Acid 1 Mg Tabs (Folic Acid) .... Take Two Times A Day 4)  Lipitor 40 Mg  Tabs (Atorvastatin Calcium) .... At Bedtime 5)  Warfarin Sodium 2 Mg  Tabs (Warfarin Sodium) .... Take As Directed By Coumadin Clinic. 6)  Metoprolol Tartrate 100 Mg Tabs (Metoprolol Tartrate) .... Take One Tablet By Mouth Twice A Day 7)  Ocuvite-Lutein   Caps (Multiple Vitamins-Minerals) .... Once Daily 8)  Fluticasone Propionate 50 Mcg/act Susp (Fluticasone Propionate) .Marland Kitchen.. 1-2 Sprays Each Nostril Once Daily 9)  Furosemide 40 Mg Tabs (Furosemide) .... Take One Tablet By Mouth Daily. 10)  Whole Mega- Fish Oil .Marland Kitchen.. 1 Tab By Mouth Two Times A Day 11)  Aspirin 81 Mg  Tabs (Aspirin) .Marland Kitchen.. 1 Tab By Mouth Once Daily 12)  Potassium Chloride Crys Cr 20 Meq Cr-Tabs (Potassium Chloride Crys Cr) .... Take One Tablet By Mouth Daily 13)  Amiodarone Hcl 200 Mg Tabs (Amiodarone Hcl) .... Take 1/2  Tablet By Mouth Once Daily 14)  Climara 0.05 Mg/24hr Ptwk (Estradiol) .... Uad 15)  Multivitamins   Tabs (Multiple Vitamin) .Marland Kitchen.. 1 By Mouth Daily  Allergies (verified): 1)  ! Demerol 2)  Codeine Phosphate (Codeine Phosphate) 3)  Morphine Sulfate (Morphine Sulfate)  Past History:  Past Medical History: Last updated: 05/10/2009 CORONARY ARTERY DISEASE (ICD-414.00) HYPERTENSION (ICD-401.9) HYPERLIPIDEMIA (ICD-272.4) PAROXYSMAL ATRIAL FIBRILLATION, HX OF (ICD-V12.59) MYOCARDIAL INFARCTION, HX OF (ICD-412) ANEMIA, IRON DEFICIENCY, HX OF (ICD-V12.3)  RENAL INSUFFICIENCY (ICD-588.9) GERD (ICD-530.81) PANIC DISORDER (ICD-300.01) ABDOMINAL PAIN, CHRONIC (ICD-789.00) IRRITABLE BOWEL SYNDROME (ICD-564.1) CEREBRAL ANEURYSM (ICD-437.3) DIVERTICULOSIS, COLON (ICD-562.10) ARTHRITIS (ICD-716.90) FECAL INCONTINENCE (ICD-787.6) H/O goiter H/O pacemker for sick sinus  syndrome/ tachycardia-bradycardia syndrome Aortic insufficiency  Past Surgical History: Last updated: 07/29/2008 cholecystectomy hysterectomy surgery for subdural hematoma-Aug. 2007 Tonsillectomy Appendectomy PPM implanted by Dr Jenne Campus 1/09  Family History: Last updated: 06/16/2008 Family History of Ovarian Cancer: Family History of Uterine Cancer: Family History of Colon Polyps: Family History of Diabetes:  4 siblings with heart disease:   Social History: Last updated: 05/10/2009 Patient is widowed.  Lives in [place], [place]. 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   Risk Factors: Smoking Status: never (10/22/2009)  Review of Systems      See HPI       The patient complains of non-productive cough, irregular heartbeats, nasal congestion/difficulty breathing through nose, and sneezing.  The patient denies shortness of breath with activity, shortness of breath at rest, productive cough, coughing up blood, chest pain, acid heartburn, indigestion, loss of appetite, weight change, abdominal pain, difficulty swallowing, sore throat, tooth/dental problems, headaches, rash, change in color of mucus, and fever.    Vital Signs:  Patient profile:   77 year old female Height:      64 inches Weight:      152.50 pounds BMI:     26.27 O2 Sat:      92 % on Room air Pulse rate:   77 / minute BP sitting:   144 / 86  (right arm) Cuff size:   regular  Vitals Entered By: Reynaldo Minium CMA (October 22, 2009 2:21 PM)  O2 Flow:  Room air CC: Follow up visit-nasal congestion, coughing,hoarseness.   Physical Exam  Additional Exam:  General: A/Ox3; pleasant and cooperative, NAD,  SKIN: no rash, lesions NODES: no lymphadenopathy HEENT: [place]/AT, EOM- WNL, Conjuctivae- clear, PERRLA, TM-WNL, Nose- wet  sniffing again, but little visible mucus, Throat- clear and wnl, Mallampati  II, hoarse NECK: Supple w/ fair ROM, JVD- none, normal carotid impulses  w/o bruits Thyroid-  CHEST: Clear to P&A, no wheeze or cough HEART: RRR, no m/g/r heard ABDOMEN: Soft and nl; ZOX:WRUE, nl pulses, no edema  NEURO: Grossly intact to observation      Impression & Recommendations:  Problem # 1:  RHINITIS (ICD-472.0)  She will use her fluticasone regularly. She says antibiotic eyedrop helped her nose. We will try treating this as a mild rhinosinusitis. She would like to try amoxacillin.  Problem # 2:  DYSPNEA (ICD-786.05)  She is describing wheeze more often now. We will give Xopenex sample and see if she finds it helps.  Problem # 3:  ATRIAL FIBRILLATION (ICD-427.31) She has vague chest discomfort she asociates with use of amiodarone. I didn't ask for CXR this visit, but that will be needed if discomfort persists.  Her updated medication list for this problem includes:    Warfarin Sodium 2 Mg Tabs (Warfarin sodium) .Marland Kitchen... Take as directed by coumadin clinic.    Metoprolol Tartrate 100 Mg Tabs (Metoprolol tartrate) .Marland Kitchen... Take one tablet by mouth twice a day    Aspirin 81 Mg Tabs (Aspirin) .Marland Kitchen... 1 tab by mouth once daily    Amiodarone Hcl 200 Mg Tabs (Amiodarone hcl) .Marland Kitchen... Take 1/2  tablet by mouth once daily  Medications Added to Medication List This Visit: 1)  Amoxicillin-pot Clavulanate 500-125 Mg Tabs (Amoxicillin-pot clavulanate) .Marland Kitchen.. 1 two times a day  Other Orders: Est. Patient Level IV (16109) Flu Vaccine 63yrs + MEDICARE PATIENTS (U0454) Administration Flu vaccine - MCR (U9811)  Patient Instructions: 1)  Please schedule a follow-up appointment in 4 months. 2)  Script for antibiotic sent to your drug store 3)  Use the fluticasone nasal spray- 2 puffs each nostril once daily at bedtime 4)  Sample Xonenex HFA rescue inhaler-- try 2 puffs up to four times a day if needed for wheeze.  5)  Try Ricola throat lozenges as needed 6)  Flu vax Prescriptions: AMOXICILLIN-POT CLAVULANATE 500-125 MG TABS (AMOXICILLIN-POT CLAVULANATE) 1 two times a  day  #20 x 0   Entered and Authorized by:   Waymon Budge MD   Signed by:   Waymon Budge MD on 10/22/2009   Method used:   Electronically to        [facility]       [address]       [place], [place]  [address]       Ph: [phone] or [phone]       Fax: [phone]   RxID:   [id number]     Flu Vaccine Consent Questions     Do you have a history of severe allergic reactions to this vaccine? no    Any prior history of allergic reactions to egg and/or gelatin? no    Do you have a sensitivity to the preservative Thimersol? no    Do you have a past history of Guillan-Barre Syndrome? no    Do you currently have an acute febrile illness? no    Have you ever had a severe reaction to latex? no    Vaccine information given and explained to patient? yes    Are you currently pregnant? no    Lot Number:[id number]   Exp Date:07/23/2010   Site Given  Left Deltoid IMedflu   Randell Loop [facility]  October 22, 2009 3:03 PM

## 2010-02-24 NOTE — Letter (Signed)
Summary: [facility] office Note  [facility] office Note   Imported By: Kassie Mends 02/17/2009 11:48:25  _____________________________________________________________________  External Attachment:    Type:   Image     Comment:   External Document

## 2010-02-24 NOTE — Cardiovascular Report (Signed)
Summary: Office Visit   Office Visit   Imported By: Roderic Ovens 09/21/2009 16:33:24  _____________________________________________________________________  External Attachment:    Type:   Image     Comment:   External Document

## 2010-02-24 NOTE — Medication Information (Signed)
Summary: cvrr  Anticoagulant Therapy  Managed by: Bethena Midget, RN, BSN Referring MD: Jens Som MD, Arlys John PCP: Kirby Funk, MD Supervising MD: Gala Romney MD, Reuel Boom Indication 1: Atrial Fibrillation (ICD-427.31) Lab Used: [facility] Site: [facility] INR POC 1.9 INR RANGE 2 - 3  Dietary changes: yes       Details: maybe alittle more leafy veggies.   Health status changes: no    Bleeding/hemorrhagic complications: no    Recent/future hospitalizations: no    Any changes in medication regimen? no    Recent/future dental: yes     Details: Yesterday had dental appt.   Any missed doses?: no       Is patient compliant with meds? yes       Allergies: 1)  ! Codeine 2)  ! Demerol 3)  ! Morphine 4)  Codeine Phosphate (Codeine Phosphate) 5)  Morphine Sulfate (Morphine Sulfate)  Anticoagulation Management History:      The patient is taking warfarin and comes in today for a routine follow up visit.  Positive risk factors for bleeding include an age of 75 years or older.  The bleeding index is 'intermediate risk'.  Positive CHADS2 values include History of HTN and Age > 75 years old.  The start date was 06/16/2008.  Anticoagulation responsible provider: Rondell Pardon MD, Reuel Boom.  INR POC: 1.9.  Cuvette Lot#: [id number].  Exp: 04/2010.    Anticoagulation Management Assessment/Plan:      The patient's current anticoagulation dose is Warfarin sodium 2 mg  tabs: Take as directed by coumadin clinic..  The target INR is 2.0-3.0.  The next INR is due 02/22/2009.  Anticoagulation instructions were given to patient.  Results were reviewed/authorized by Bethena Midget, RN, BSN.  She was notified by Bethena Midget, RN, BSN.         Prior Anticoagulation Instructions: INR 1.8  Take 2 tablets tomorrow (this Wednesday), then resume same dosage  1.5 tablets daily except 2 tablets on Tuesdays, Thursdays, and Saturdays.  Recheck in 3 weeks.    Current Anticoagulation Instructions: INR 1.9 Today take 4mg s  then change to 4mg s everyday except 3mg s on Mondays, Wednesdays and Fridays. Recheck in 2 weeks.

## 2010-02-24 NOTE — Progress Notes (Signed)
Summary: SOB/AFIB  Phone Note Call from Patient Call back at Home Phone [phone]   Caller: Patient Reason for Call: Talk to Nurse Summary of Call: pt states she is SOB(I can hear it in her voice)  and in afib, started this afternoon, some weakness  Initial call taken by: Migdalia Dk,  Jun 02, 2009 4:27 PM  Follow-up for Phone Call        Spoke with pt who reports she started not feeling well last night but felt it was due to the excitement of getting a new dog. Went out today and had SOB and trouble getting a deep breath while doing errands. States heart beat has been irregular since yesterday.  Pt now at home and SOB relieved with rest. BP now 137/69 and heart rate 101. No swelling in feet or ankles. Complains of "anxious" feeling in stomach--no nausea. I told pt I would forward to Dr.Crenshaw to review Follow-up by: Dossie Arbour, RN, BSN,  Jun 02, 2009 4:48 PM  Additional Follow-up for Phone Call Additional follow up Details #1::        increase cardizem to 240 q am and 180 q pm; take additional lasix 20 mg by mouth daily as needed for increased SOB or edema Ferman Hamming, MD, [facility]  Jun 02, 2009 5:20PM  spoke with pt, her heart rate is now 41 and her SOB is better. she feels like some of the problem is anxiety. she had taken a valium and that have helped. she will increase her cardizem. she has some 240mg  at home. she does not feel she needs extra lasix now but will cont to weigh herself and take and extra 20mg  as needed. she is going to contact the hospice folks about getting into counseling because she feels alot of the problems are anxiety and lonliness since her husband died. reassurance given to pt. she is seeing dr Johney Frame tomorrow and her pacer will be checked at that time. she will call back with further problems [name], RN  Jun 02, 2009 5:47 PM     New/Updated Medications: DILTIAZEM HCL ER BEADS 240 MG [id number] (DILTIAZEM HCL ER BEADS) Take one  capsule by mouth every morning

## 2010-02-24 NOTE — Progress Notes (Signed)
Summary: Question about switching from coumadin to pradaxa  ---- Converted from flag ---- ---- 10/04/2009 1:30 PM, Ferman Hamming, MD, [facility] wrote: no; gfr too low  ---- 10/04/2009 10:27 AM, Bethena Midget, RN, BSN wrote: Pt is inquiring about Pradaxa, wanting to know if she can switch from couamdin. ------------------------------       Additional Follow-up for Phone Call Additional follow up Details #2::    LMOM for pt that GFR too low per Dr Jens Som, thus she would have to remain on coumadin.  Follow-up by: Bethena Midget, RN, BSN,  October 04, 2009 2:23 PM

## 2010-02-24 NOTE — Medication Information (Signed)
Summary: RX Folder/ DILTIAZEM & METOPROLOL  RX Folder/ DILTIAZEM & METOPROLOL   Imported By: Dorise Hiss 04/22/2009 16:57:40  _____________________________________________________________________  External Attachment:    Type:   Image     Comment:   External Document

## 2010-02-24 NOTE — Assessment & Plan Note (Signed)
Summary: ALLERGY TEST- OK PER KATIE ///KP   Vital Signs:  Patient profile:   77 year old female Height:      64 inches Weight:      146 pounds BMI:     25.15 O2 Sat:      96 % on Room air Pulse rate:   64 / minute BP sitting:   116 / 70  (left arm) Cuff size:   regular  Vitals Entered By: Reynaldo Minium CMA (June 24, 2009 3:43 PM)  O2 Flow:  Room air  Copy to:  [name], MD Primary Provider/Referring Provider:  Kirby Funk, MD  CC:  Allergy Testing.  History of Present Illness:  History of Present Illness: April 08, 2009- History of Present Illness:  65 yoF self referred with concern about allergy which she says has bothered her for years. In the 1960's she remembers allergy vaccine seeming to help her, with Dr Rema Jasmine.She also got habituated to Afrin at that time with difficulty quitting. No hx of asthma. She has gotten worse again in recent years. She wakes with nasal congestion, sneeze, ears stopped up. This morning she spit out some red blood- uncerftain source. Mild wheeze. Usually has perennial nasal congestion with postnasal drip. Had tonisllectomy and "sinus surgery" Questions food allergy or intolerance- gluten may cause gas, milk gives diarrhea.  June 24, 2009- Allergic rhinitis For allergy testing. Denies blood, pain or purulence. IgE 1.5, specific Neg, Food panel neg Nasal stuffiness intermittent. Fluticasone spray seems to help. Not using antihistamines. Last skin tested in 1960's. Has avoided antihistamines because she thought they were bad for her heart- she was confusing decongestants. 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 Skin test- Mostly low level reactions, strongest for cat      Preventive Screening-Counseling & Management  Alcohol-Tobacco     Smoking Status: never  Current Medications (verified): 1)  Valium 5 Mg  Tabs (Diazepam) .... Take 1/2 To 1 Daily As Needed 2)  Pantoprazole Sodium 40 Mg  Tbec  (Pantoprazole Sodium) .... Once Daily 3)  Folic Acid 1 Mg Tabs (Folic Acid) .... Take Two Times A Day 4)  Lipitor 40 Mg  Tabs (Atorvastatin Calcium) .... At Bedtime 5)  Warfarin Sodium 2 Mg  Tabs (Warfarin Sodium) .... Take As Directed By Coumadin Clinic. 6)  Metoprolol Tartrate 100 Mg Tabs (Metoprolol Tartrate) .... Take One Tablet By Mouth Twice A Day 7)  Ocuvite-Lutein   Caps (Multiple Vitamins-Minerals) .... Once Daily 8)  Fluticasone Propionate 50 Mcg/act Susp (Fluticasone Propionate) .Marland Kitchen.. 1-2 Sprays Each Nostril Once Daily 9)  Furosemide 20 Mg Tabs (Furosemide) .... Take One Tablet By Mouth Daily. 10)  Whole Mega- Fish Oil .Marland Kitchen.. 1 Tab By Mouth Two Times A Day 11)  Aspirin 81 Mg  Tabs (Aspirin) .Marland Kitchen.. 1 Tab By Mouth Once Daily 12)  Potassium Chloride Crys Cr 20 Meq Cr-Tabs (Potassium Chloride Crys Cr) .... Take One Tablet By Mouth Daily 13)  Amiodarone Hcl 200 Mg Tabs (Amiodarone Hcl) .... Take One Tablet By Mouth Once Daily 14)  Climara 0.05 Mg/24hr Ptwk (Estradiol) .... Uad  Allergies (verified): 1)  ! Demerol 2)  Codeine Phosphate (Codeine Phosphate) 3)  Morphine Sulfate (Morphine Sulfate)  Past History:  Past Medical History: Last updated: 05/10/2009 CORONARY ARTERY DISEASE (ICD-414.00) HYPERTENSION (ICD-401.9) HYPERLIPIDEMIA (ICD-272.4) PAROXYSMAL ATRIAL FIBRILLATION, HX OF (ICD-V12.59) MYOCARDIAL INFARCTION, HX OF (ICD-412) ANEMIA, IRON DEFICIENCY, HX OF (ICD-V12.3) RENAL INSUFFICIENCY (ICD-588.9) GERD (ICD-530.81) PANIC DISORDER (ICD-300.01) ABDOMINAL  PAIN, CHRONIC (ICD-789.00) IRRITABLE BOWEL SYNDROME (ICD-564.1) CEREBRAL ANEURYSM (ICD-437.3) DIVERTICULOSIS, COLON (ICD-562.10) ARTHRITIS (ICD-716.90) FECAL INCONTINENCE (ICD-787.6) H/O goiter H/O pacemker for sick sinus syndrome/ tachycardia-bradycardia syndrome Aortic insufficiency  Past Surgical History: Last updated: 07/29/2008 cholecystectomy hysterectomy surgery for subdural hematoma-Aug.  2007 Tonsillectomy Appendectomy PPM implanted by Dr Jenne Campus 1/09  Family History: Last updated: 06/16/2008 Family History of Ovarian Cancer: Family History of Uterine Cancer: Family History of Colon Polyps: Family History of Diabetes:  4 siblings with heart disease:   Social History: Last updated: 05/10/2009 Patient is widowed.  Lives in [place], [place]. 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   Risk Factors: Smoking Status: never (06/24/2009)  Review of Systems      See HPI       The patient complains of acid heartburn, indigestion, nasal congestion/difficulty breathing through nose, and sneezing.  The patient denies shortness of breath with activity, shortness of breath at rest, productive cough, non-productive cough, coughing up blood, chest pain, irregular heartbeats, loss of appetite, weight change, abdominal pain, difficulty swallowing, sore throat, tooth/dental problems, and headaches.    Physical Exam  Additional Exam:  General: A/Ox3; pleasant and cooperative, NAD,  SKIN: no rash, lesions NODES: no lymphadenopathy HEENT: [place]/AT, EOM- WNL, Conjuctivae- clear, PERRLA, TM-WNL, Nose- wet  sniffing, Throat- clear and wnl, Mallampati  II, hoarse NECK: Supple w/ fair ROM, JVD- none, normal carotid impulses w/o bruits Thyroid-  CHEST: Clear to P&A HEART: RRR, no m/g/r heard ABDOMEN: Soft and nl; ZOX:WRUE, nl pulses, no edema  NEURO: Grossly intact to observation      Impression & Recommendations:  Problem # 1:  ? of ALLERGY, FOOD (ICD-693.1)  Negative screen- avoid foods that seem to cause problems  Problem # 2:  RHINITIS (ICD-472.0)  Skin tests are suggestive that there may be an atopic component. I told her that nonallergic/ irritant and vasomotor factors, stress (grieving loss of husband last Fall) and reflux may all be contibutory. She is already on fluticasone. We will try nonsedating antihistamines.  Other  Orders: Est. Patient Level III (45409) Allergy Puncture Test (81191) Allergy I.D Test (47829)  Patient Instructions: 1)  Please schedule a follow-up appointment in 2 months. 2)  Continue fluticasone nasal spray 3)  Consider trying an otc antihistamine like 4)  Claritin/ loratadine 5)  Allegra/ fexofenadine

## 2010-02-24 NOTE — Progress Notes (Signed)
  Phone Note Call from Patient Call back at Home Phone [phone]   Reason for Call: Talk to Doctor Action Taken: Phone Call Completed, Provider Notified Summary of Call: Mrs. Tracey [name] called complaining of increased SOB, palpatations after being seen by Dr. Daleen Squibb on March 24th.  At that time, she was a walk-in to the clinic.  His note states that her metoprolol was increased to 100mg  two times a day, and she was to continue Diltiazem 180mg  two times a day as prescribed by Dr. Jens Som.  She also saw her primary care physician and was given a RX for HCTZ because of LEE.  Today she says she feels worse and that her breathing status is worsened, but only when she gets up and walks.  She says that the HCTZ did not help her. She wants to go back to the lower dose of metoprolol, 50mg  daily and diltiazem 180mg  daily.  She says she felt better on the lower dose.    I have advised her that she can try the lower dose if she wants to if she felt better.  I also advised to call the office on Monday for follow-up, and that I would call and leave a message as well.  She is to come to the ER if her symptoms persist or become worse.  She refused to come at this time.

## 2010-02-24 NOTE — Medication Information (Signed)
Summary: rov/tm  Anticoagulant Therapy  Managed by: Weston Brass, PharmD Referring MD: Jens Som MD, Arlys John PCP: Kirby Funk, MD Supervising MD: Jens Som MD, Arlys John Indication 1: Atrial Fibrillation (ICD-427.31) Lab Used: [facility] Site: [facility] INR POC 3.4 INR RANGE 2 - 3  Dietary changes: no    Health status changes: no    Bleeding/hemorrhagic complications: no    Recent/future hospitalizations: no    Any changes in medication regimen? yes       Details: started amiodarone 2 weeks ago  Recent/future dental: no  Any missed doses?: no       Is patient compliant with meds? yes       Allergies: 1)  ! Demerol 2)  Codeine Phosphate (Codeine Phosphate) 3)  Morphine Sulfate (Morphine Sulfate)  Anticoagulation Management History:      The patient is taking warfarin and comes in today for a routine follow up visit.  Positive risk factors for bleeding include an age of 75 years or older and presence of serious comorbidities.  The bleeding index is 'intermediate risk'.  Positive CHADS2 values include History of HTN and Age > 69 years old.  The start date was 06/16/2008.  Her last INR was 2.4 ratio.  Anticoagulation responsible provider: Jens Som MD, Arlys John.  INR POC: 3.4.  Cuvette Lot#: [id number].  Exp: 08/2010.    Anticoagulation Management Assessment/Plan:      The patient's current anticoagulation dose is Warfarin sodium 2 mg  tabs: Take as directed by coumadin clinic..  The target INR is 2.0-3.0.  The next INR is due 07/05/2009.  Anticoagulation instructions were given to patient.  Results were reviewed/authorized by Weston Brass, PharmD.  She was notified by Weston Brass PharmD.         Prior Anticoagulation Instructions: INR 2.7 Continue 4mg s everyday except 3mg s on Mondays and Fridays. Recheck in 4 weeks.   Current Anticoagulation Instructions: INR 3.4  Take 1/2 tablet today then decrease dose to 2 tablets every day except 1 1/2 tablets on Monday, Wednesday and Friday

## 2010-02-24 NOTE — Medication Information (Signed)
Summary: Tracey [name]  Anticoagulant Therapy  Managed by: Cloyde Reams, RN, BSN Referring MD: Jens Som MD, Arlys John PCP: Kirby Funk, MD Supervising MD: Ladona Ridgel MD, Sharlot Gowda Indication 1: Atrial Fibrillation (ICD-427.31) Lab Used: [facility] Site: [facility] INR POC 2.8 INR RANGE 2 - 3  Dietary changes: no    Health status changes: yes       Details: Pt c/o unclear vision, has hx of macular degeneration.  Denies dizziness, spots, blurred vision.  Denies CP or SOB.    Bleeding/hemorrhagic complications: yes       Details: Occassional nose bleed when uses nasal spray.    Recent/future hospitalizations: no    Any changes in medication regimen? no    Recent/future dental: no  Any missed doses?: no       Is patient compliant with meds? yes      Comments: Advised pt to seek immediate medical attention if vision persists or worsens.  Allergies: 1)  ! Codeine 2)  ! Demerol 3)  ! Morphine 4)  Codeine Phosphate (Codeine Phosphate) 5)  Morphine Sulfate (Morphine Sulfate)  Anticoagulation Management History:      The patient is taking warfarin and comes in today for a routine follow up visit.  Positive risk factors for bleeding include an age of 75 years or older.  The bleeding index is 'intermediate risk'.  Positive CHADS2 values include History of HTN and Age > 70 years old.  The start date was 06/16/2008.  Anticoagulation responsible provider: Ladona Ridgel MD, Sharlot Gowda.  INR POC: 2.8.  Cuvette Lot#: [id number].  Exp: 04/2010.    Anticoagulation Management Assessment/Plan:      The patient's current anticoagulation dose is Warfarin sodium 2 mg  tabs: Take as directed by coumadin clinic..  The target INR is 2.0-3.0.  The next INR is due 04/07/2009.  Anticoagulation instructions were given to patient.  Results were reviewed/authorized by Cloyde Reams, RN, BSN.  She was notified by Cloyde Reams RN.         Prior Anticoagulation Instructions: INR 1.6  Start taking 2 tablets daily except 1.5 tablets  on Mondays and Fridays.  Recheck in 2 weeks.    Current Anticoagulation Instructions: INR 2.8  Continue on same dosage 2 tablets daily except 1.5 tablets on Mondays and Fridays.  Recheck in 3 weeks.

## 2010-02-24 NOTE — Assessment & Plan Note (Signed)
Summary: 6 MO F/U   Referring Provider:  [name], MD Primary Provider:  Kirby Funk, MD   History of Present Illness: [name] presents today for EP follow-up.  She has paroxysmal atrial fibrillation, status post pacemaker placement due to tachybradycardia syndrome, coronary artery disease with prior PCI of the right coronary artery in 2007 with drug-eluting stents.  A myoview in November of 2010 showed an ejection fraction of 57%. There was possible mild anterior ischemia but felt more likely to be probable soft tissue attenuation. We are treating medically.   She returns today for follow-up and reports symptoms of SOB and decreased exercise tolerance with afib.  Her cardizem and lasix were recently increased by Dr Jens Som without improvement in symptoms.  She denies recent episodes of chest pain.  Current Medications (verified): 1)  Valium 5 Mg  Tabs (Diazepam) .... Take 1/2 To 1 Daily As Needed 2)  Pantoprazole Sodium 40 Mg  Tbec (Pantoprazole Sodium) .... Once Daily 3)  Folic Acid 1 Mg Tabs (Folic Acid) .... Take Two Times A Day 4)  Lipitor 40 Mg  Tabs (Atorvastatin Calcium) .... At Bedtime 5)  Warfarin Sodium 2 Mg  Tabs (Warfarin Sodium) .... Take As Directed By [facility]mad[facility] Clinic. 6)  Metoprolol Tartrate 100 Mg Tabs (Metoprolol Tartrate) .... Take One Tablet By Mouth Twice A Day 7)  Ocuvite-Lutein   Caps (Multiple Vitamins-Minerals) .... Once Daily 8)  Fluticasone Propionate 50 Mcg/act Susp (Fluticasone Propionate) .Marland Kitchen.. 1-2 Sprays Each Nostril Once Daily 9)  Diltiazem Hcl Er Beads 180 Mg Xr24h-Cap (Diltiazem Hcl Er Beads) .Marland Kitchen.. 1 Tab Po Two Times A Day 10)  Furosemide 20 Mg Tabs (Furosemide) .... Take One Tablet By Mouth Daily. 11)  Whole Mega- Fish Oil .Marland Kitchen.. 1 Tab By Mouth Two Times A Day 12)  Aspirin 81 Mg  Tabs (Aspirin) .Marland Kitchen.. 1 Tab By Mouth Once Daily 13)  Potassium Chloride Crys Cr 20 Meq Cr-Tabs (Potassium Chloride Crys Cr) .... Take One Tablet By Mouth Daily 14)  Diltiazem Hcl  Er Beads 180 Mg Xr24h-Cap (Diltiazem Hcl Er Beads) .... Take One Capsule By Mouth Twice A Day 15)  B Complex Vitamins Plus  Tabs (Multiple Vitamins-Minerals) .... Take Daily  Allergies (verified): 1)  ! Demerol 2)  Codeine Phosphate (Codeine Phosphate) 3)  Morphine Sulfate (Morphine Sulfate)  Past History:  Past Medical History: Reviewed history from 05/10/2009 and no changes required. CORONARY ARTERY DISEASE (ICD-414.00) HYPERTENSION (ICD-401.9) HYPERLIPIDEMIA (ICD-272.4) PAROXYSMAL ATRIAL FIBRILLATION, HX OF (ICD-V12.59) MYOCARDIAL INFARCTION, HX OF (ICD-412) ANEMIA, IRON DEFICIENCY, HX OF (ICD-V12.3) RENAL INSUFFICIENCY (ICD-588.9) GERD (ICD-530.81) PANIC DISORDER (ICD-300.01) ABDOMINAL PAIN, CHRONIC (ICD-789.00) IRRITABLE BOWEL SYNDROME (ICD-564.1) CEREBRAL ANEURYSM (ICD-437.3) DIVERTICULOSIS, COLON (ICD-562.10) ARTHRITIS (ICD-716.90) FECAL INCONTINENCE (ICD-787.6) H/O goiter H/O pacemker for sick sinus syndrome/ tachycardia-bradycardia syndrome Aortic insufficiency  Past Surgical History: Reviewed history from 07/29/2008 and no changes required. cholecystectomy hysterectomy surgery for subdural hematoma-Aug. 2007 Tonsillectomy Appendectomy PPM implanted by Dr Jenne Campus 1/09  Social History: Reviewed history from 05/10/2009 and no changes required. Patient is widowed.  Lives in [place], [place]. 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   Review of Systems       All systems are reviewed and negative except as listed in the HPI.   Vital Signs:  Patient profile:   77 year old female Height:      64 inches Weight:      148 pounds BMI:     25.50 Pulse rate:   71 /  minute BP sitting:   120 / 84  (left arm)  Vitals Entered By: Laurance Flatten CMA (Jun 03, 2009 2:36 PM)  Physical Exam  General:  elderly, NAD Head:  normocephalic and atraumatic Eyes:  PERRLA/EOM intact; conjunctiva and lids normal. Mouth:  Teeth, gums and  palate normal. Oral mucosa normal. Neck:  Neck supple, no JVD. No masses, thyromegaly or abnormal cervical nodes. Lungs:  Clear bilaterally to auscultation and percussion. Heart:  iRRR, no m/r/g Abdomen:  Bowel sounds positive; abdomen soft and non-tender without masses, organomegaly, or hernias noted. No hepatosplenomegaly. Msk:  Back normal, normal gait. Muscle strength and tone normal. Pulses:  pulses normal in all 4 extremities Extremities:  trace edema Neurologic:  Alert and oriented x 3. Skin:  Intact without lesions or rashes. Cervical Nodes:  no significant adenopathy Psych:  Normal affect.   EKG  Procedure date:  06/03/2009  Findings:      afib, V rate 70 bpm, demand V pacing.  marked TWI in the inferolateral leads are unchanges from prior ekg and may represent t wave memory with pacing  PPM Specifications Following MD:  Hillis Range, MD     Referring MD:  Cecille Po Vendor:  Medtronic     PPM Model Number:  [id number]     PPM Serial Number:  [id number] PPM DOI:  01/30/2007     PPM Implanting MD:  NOT IMPLANTED HERE  Lead 1    Location: RA     DOI: 01/30/2007     Model #: [id number]     Serial #: [id number]     Status: active Lead 2    Location: RV     DOI: 01/30/2007     Model #: [id number]     Serial #: [id number]     Status: active  PPM Follow Up Remote Check?  No Battery Voltage:  2.99 V     Pacer Dependent:  No       PPM Device Measurements Atrium  Impedance: 472 ohms,  Right Ventricle  Impedance: 368 ohms,   Episodes MS Episodes:  2     Percent Mode Switch:  43.5%     Coumadin:  Yes Ventricular High Rate:  0     Atrial Pacing:  56.6%     Ventricular Pacing:  29.2%  Parameters Mode:  MVP (R)     Lower Rate Limit:  60     Upper Rate Limit:  120 Paced AV Delay:  180     Sensed AV Delay:  150 Rate Response Parameters:  Rate response-8, Threshold-Med/low, Acceleration-30 seconds, Deceleration-5 min Tech Comments:  Pt in afib with RVR today at a rate of 110.  Plan per Dr  Johney Frame.  Atrial therapies on, not being successfully treated. Gypsy Balsam RN BSN  Jun 03, 2009 2:43 PM  MD Comments:  agree  Impression & Recommendations:  Problem # 1:  ATRIAL FIBRILLATION (ICD-427.31) [name] presents today with symptomatic afib.  Her primary complain is SOB, though she is not significantly volume overloaded today by exam.  I think that her symptoms are due to afib and therefore would improve with sinus rhythm.  She is not a candidate for Ic (prior CAD) or Multaq (recent CHF).  Her Qtc today is 53, I would therefore be reluctant to consider tikosyn or sotalol.  After discussing the risks of amiodarone with the patient, she wishes to proceed with amiodarone therapy.  We will start amiodarone 200mg  two times a day today.  We will  follow LFTs and TFTs on this medicine.  I will have her return in several weeks and will consider cardioversion if she has not converted to sinus.  Her coumadin has been therapeutic.  Problem # 2:  CHEST PAIN (ICD-786.50) No recent chest pain.  EKg shows significant TWI in the inferolateral leads, similar to a prior ekg obtained by Dr Daleen Squibb.  Given her prior low risk myoview, we will continue to follow.  However if she has further chest pain or her sob does not improve with sinus rhythm, then we may need to consider cath for further risk stratefication.  Problem # 3:  CORONARY ARTERY DISEASE (ICD-414.00) as above  Problem # 4:  HYPERTENSION (ICD-401.9) stable no change  Patient Instructions: 1)  Your physician recommends that you schedule a follow-up appointment in: 2 weeks. 2)  Your physician has recommended you make the following change in your medication: Start Amiodarone 200mg  two times a day. Prescriptions: AMIODARONE HCL 200 MG TABS (AMIODARONE HCL) Take one tablet by mouth twice a day  #60 x 3   Entered by:   Laurance Flatten CMA   Authorized by:   Hillis Range, MD   Signed by:   Laurance Flatten CMA on 06/03/2009   Method used:   Electronically to          [facility] (retail)       [address]       [place], [place]  [address]       Ph: [phone] or [phone]       Fax: [phone]   RxID:   [id number]

## 2010-02-24 NOTE — Progress Notes (Signed)
Summary: speak to nurse  Phone Note Call from Patient Call back at Home Phone [phone]   Caller: Patient Summary of Call: pt has lost 6lbs and feels alot better, just wanted to thank the nurse Initial call taken by: Migdalia Dk,  May 07, 2009 1:55 PM  Follow-up for Phone Call        Called patient ..she states that she feels much better.Advised to keep her appointment on 4/18 with Dr.Crenshaw. Follow-up by: Suzan Garibaldi RN

## 2010-02-24 NOTE — Medication Information (Signed)
Summary: ccr  Anticoagulant Therapy  Managed by: Cloyde Reams, RN, BSN Referring MD: Jens Som MD, Arlys John PCP: Kirby Funk, MD Supervising MD: Jens Som MD, Arlys John Indication 1: Atrial Fibrillation (ICD-427.31) Lab Used: [facility] Site: [facility] INR POC 1.6 INR RANGE 2 - 3  Dietary changes: no    Health status changes: no    Bleeding/hemorrhagic complications: no    Recent/future hospitalizations: no    Any changes in medication regimen? no    Recent/future dental: no  Any missed doses?: no       Is patient compliant with meds? yes       Allergies: 1)  ! Codeine 2)  ! Demerol 3)  ! Morphine 4)  Codeine Phosphate (Codeine Phosphate) 5)  Morphine Sulfate (Morphine Sulfate)  Anticoagulation Management History:      The patient is taking warfarin and comes in today for a routine follow up visit.  Positive risk factors for bleeding include an age of 75 years or older.  The bleeding index is 'intermediate risk'.  Positive CHADS2 values include History of HTN and Age > 75 years old.  The start date was 06/16/2008.  Anticoagulation responsible provider: Jens Som MD, Arlys John.  INR POC: 1.6.  Cuvette Lot#: [id number].  Exp: 04/2010.    Anticoagulation Management Assessment/Plan:      The patient's current anticoagulation dose is Warfarin sodium 2 mg  tabs: Take as directed by coumadin clinic..  The target INR is 2.0-3.0.  The next INR is due 03/17/2009.  Anticoagulation instructions were given to patient.  Results were reviewed/authorized by Cloyde Reams, RN, BSN.  She was notified by Cloyde Reams RN.         Prior Anticoagulation Instructions: INR 1.9 Today take 4mg s then change to 4mg s everyday except 3mg s on Mondays, Wednesdays and Fridays. Recheck in 2 weeks.   Current Anticoagulation Instructions: INR 1.6  Start taking 2 tablets daily except 1.5 tablets on Mondays and Fridays.  Recheck in 2 weeks.

## 2010-02-24 NOTE — Assessment & Plan Note (Signed)
Summary: 2wk fu   Visit Type:  Follow-up Referring Provider:  [name], MD Primary Provider:  Kirby Funk, MD   History of Present Illness: Tracey [name] presents today for EP follow-up.  She has paroxysmal atrial fibrillation, status post pacemaker placement due to tachybradycardia syndrome, coronary artery disease with prior PCI of the right coronary artery in 2007 with drug-eluting stents.  A myoview in November of 2010 showed an ejection fraction of 57%. There was possible mild anterior ischemia but felt more likely to be probable soft tissue attenuation. We are treating medically.   She returns today for follow-up and reports that her symptoms of SOB and decreased exercise tolerance have not improved.  She reports occasional sharp epigastric pain after eating.  She reports dypsnea with moderate activity as well as BLE edema.  She is otherwise without complaint today.  Current Medications (verified): 1)  Valium 5 Mg  Tabs (Diazepam) .... Take 1/2 To 1 Daily As Needed 2)  Pantoprazole Sodium 40 Mg  Tbec (Pantoprazole Sodium) .... Once Daily 3)  Folic Acid 1 Mg Tabs (Folic Acid) .... Take Two Times A Day 4)  Lipitor 40 Mg  Tabs (Atorvastatin Calcium) .... At Bedtime 5)  Warfarin Sodium 2 Mg  Tabs (Warfarin Sodium) .... Take As Directed By Coumadin Clinic. 6)  Metoprolol Tartrate 100 Mg Tabs (Metoprolol Tartrate) .... Take One Tablet By Mouth Twice A Day 7)  Ocuvite-Lutein   Caps (Multiple Vitamins-Minerals) .... Once Daily 8)  Fluticasone Propionate 50 Mcg/act Susp (Fluticasone Propionate) .Marland Kitchen.. 1-2 Sprays Each Nostril Once Daily 9)  Diltiazem Hcl Er Beads 180 Mg Xr24h-Cap (Diltiazem Hcl Er Beads) .Marland Kitchen.. 1 Tab Po Two Times A Day 10)  Furosemide 20 Mg Tabs (Furosemide) .... Take One Tablet By Mouth Daily. 11)  Whole Mega- Fish Oil .Marland Kitchen.. 1 Tab By Mouth Two Times A Day 12)  Aspirin 81 Mg  Tabs (Aspirin) .Marland Kitchen.. 1 Tab By Mouth Once Daily 13)  Potassium Chloride Crys Cr 20 Meq Cr-Tabs (Potassium  Chloride Crys Cr) .... Take One Tablet By Mouth Daily 14)  Diltiazem Hcl Er Beads 180 Mg Xr24h-Cap (Diltiazem Hcl Er Beads) .... Take One Capsule By Mouth Twice A Day 15)  Amiodarone Hcl 200 Mg Tabs (Amiodarone Hcl) .... Take One Tablet By Mouth Twice A Day 16)  Climara 0.05 Mg/24hr Ptwk (Estradiol) .... Uad  Allergies (verified): 1)  ! Demerol 2)  Codeine Phosphate (Codeine Phosphate) 3)  Morphine Sulfate (Morphine Sulfate)  Past History:  Past Medical History: Reviewed history from 05/10/2009 and no changes required. CORONARY ARTERY DISEASE (ICD-414.00) HYPERTENSION (ICD-401.9) HYPERLIPIDEMIA (ICD-272.4) PAROXYSMAL ATRIAL FIBRILLATION, HX OF (ICD-V12.59) MYOCARDIAL INFARCTION, HX OF (ICD-412) ANEMIA, IRON DEFICIENCY, HX OF (ICD-V12.3) RENAL INSUFFICIENCY (ICD-588.9) GERD (ICD-530.81) PANIC DISORDER (ICD-300.01) ABDOMINAL PAIN, CHRONIC (ICD-789.00) IRRITABLE BOWEL SYNDROME (ICD-564.1) CEREBRAL ANEURYSM (ICD-437.3) DIVERTICULOSIS, COLON (ICD-562.10) ARTHRITIS (ICD-716.90) FECAL INCONTINENCE (ICD-787.6) H/O goiter H/O pacemker for sick sinus syndrome/ tachycardia-bradycardia syndrome Aortic insufficiency  Past Surgical History: Reviewed history from 07/29/2008 and no changes required. cholecystectomy hysterectomy surgery for subdural hematoma-Aug. 2007 Tonsillectomy Appendectomy PPM implanted by Dr Jenne Campus 1/09  Social History: Reviewed history from 05/10/2009 and no changes required. Patient is widowed.  Lives in [place], [place]. 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   Review of Systems       All systems are reviewed and negative except as listed in the HPI.   Vital Signs:  Patient profile:   77 year old female Height:  64 inches Weight:      148 pounds BMI:     25.50 O2 Sat:      98 % Pulse rate:   62 / minute BP sitting:   110 / 68  (left arm)  Vitals Entered By: Laurance Flatten CMA (Jun 14, 2009 3:27  PM)  Physical Exam  General:  elderly, NAD Head:  normocephalic and atraumatic Eyes:  PERRLA/EOM intact; conjunctiva and lids normal. Mouth:  Teeth, gums and palate normal. Oral mucosa normal. Neck:  Neck supple, no JVD. No masses, thyromegaly or abnormal cervical nodes. Chest Wall:  no deformities or breast masses noted Lungs:  Clear bilaterally to auscultation and percussion. Heart:  RRR, no m/r/g Abdomen:  Bowel sounds positive; abdomen soft and non-tender without masses, organomegaly, or hernias noted. No hepatosplenomegaly. Msk:  Back normal, normal gait. Muscle strength and tone normal. Pulses:  pulses normal in all 4 extremities Extremities:  1+ BLE edema Neurologic:  Alert and oriented x 3. Skin:  fine petechial rash over both legs Cervical Nodes:  no significant adenopathy Psych:  anxious   EKG  Procedure date:  06/14/2009  Findings:      sinus/ demand atrial pacing, PR , RBBB, LAHB, nonspecific ST/T changes  PPM Specifications Following MD:  Hillis Range, MD     Referring MD:  Cecille Po Vendor:  Medtronic     PPM Model Number:  [id number]     PPM Serial Number:  [id number] PPM DOI:  01/30/2007     PPM Implanting MD:  NOT IMPLANTED HERE  Lead 1    Location: RA     DOI: 01/30/2007     Model #: [id number]     Serial #: [id number]     Status: active Lead 2    Location: RV     DOI: 01/30/2007     Model #: [id number]     Serial #: [id number]     Status: active  PPM Follow Up Battery Voltage:  3.00 V     Pacer Dependent:  No       PPM Device Measurements Atrium  Amplitude: 2.0 mV, Impedance: 472 ohms,  Right Ventricle  Amplitude: 10.5 mV, Impedance: 392 ohms,   Episodes Coumadin:  Yes Ventricular High Rate:  0     Atrial Pacing:  82.0%     Ventricular Pacing:  10.5%  Parameters Mode:  MVP (R)     Lower Rate Limit:  60     Upper Rate Limit:  120 Paced AV Delay:  180     Sensed AV Delay:  150 Rate Response Parameters:  Rate response-8, Threshold-Med/low, Acceleration-30  seconds, Deceleration-5 min Tech Comments:  INTERROGATION ONLY--PT IN AF 17.5% SINCE LAST CHECK ON 06-03-09.  NO CHANGES MADE. Vella Kohler  Jun 14, 2009 4:45 PM MD Comments:  agree  Impression & Recommendations:  Problem # 1:  ATRIAL FIBRILLATION (ICD-427.31) Pt is now maintaining sinus rhythm on amiodarone.  We will decrease amiodarone to 200mg  daily today.  I will stop cardizem which may be contributing to diastolic CHF. salt restriction is advised. We will check LFTs and TFTs today on amiodaorne. continue coumadin.  Problem # 2:  DYSPNEA (ICD-786.05) The patient has acute on chronic diastolic CHF.  She will continue lasix 40mg  daily. Salt restriction.  Cardizem is discontinued. She will follow-up with Dr Jens Som. We will check BMET today.  Problem # 3:  CHEST PAIN (ICD-786.50) atypical recent low risk myoview. given renal function, would avoid cath if able  Problem # 4:  PACEMAKER, PERMANENT (ICD-V45.01) normal pacemaker function  Problem # 5:  HYPERTENSION (ICD-401.9) stable no changes today  Problem # 6:  RENAL INSUFFICIENCY (ICD-588.9) creatinine is stable but elevated (1.5 a year ago) check BMT,  check CBC given petechial rash  Other Orders: TLB-BMP (Basic Metabolic Panel-BMET) (80048-METABOL) TLB-Hepatic/Liver Function Pnl (80076-HEPATIC) TLB-T4 (Thyrox), Free [phone]) TLB-TSH (Thyroid Stimulating Hormone) (84443-TSH) TLB-CBC Platelet - w/Differential (85025-CBCD)  Patient Instructions: 1)  Your physician recommends that you schedule a follow-up appointment in: 4 weeks with Dr. Jens Som 3 months with Dr. Johney Frame 2)  Your physician recommends that you return for lab work today 3)  Your physician has recommended you make the following change in your medication: stop diltiazem. Decrease Amiodarone to 200mg  once daily. 4)  Your physician has requested that you limit the intake of sodium (salt) in your diet to four grams daily. Please see [facility] handout.

## 2010-02-24 NOTE — Assessment & Plan Note (Signed)
Summary: ROV.GD   Referring Provider:  [name], MD Primary Provider:  Kirby Funk, MD  CC:  sob.  History of Present Illness: Pleasant female with a past medical history of paroxysmal atrial fibrillation, status post pacemaker placement due to tachybradycardia syndrome, coronary artery disease with prior PCI of the right coronary artery in 2007 with drug-eluting stents.  A myoview in November of 2010 showed an ejection fraction of 57%. There was possible mild anterior ischemia but felt more likely to be probable soft tissue attenuation. We are treating medically. She was seen in this office on April 15, 2009 with recurrent atrial fibrillation. Her beta blocker and Cardizem have been increased. She also developed volume overload and was diuresed with Lasix 20 mg p.o. daily. Note she also recently had a viral gastroenteritis and had difficulties with diarrhea. She had significant weakness from all of the above. Since then she has mild dyspnea on exertion but this continues to improve. It is relieved with rest. It is not associated with chest pain. There is no orthopnea or PND. She did have significant pedal edema. However this is improving as well. She states that she went back into sinus rhythm approximately 5-7 days ago. There's been no bleeding.  Current Medications (verified): 1)  Valium 5 Mg  Tabs (Diazepam) .... Take 1/2 To 1 Daily As Needed 2)  Pantoprazole Sodium 40 Mg  Tbec (Pantoprazole Sodium) .... Once Daily 3)  Folic Acid-Vit B6-Vit B12 0.4-50-0.1 Mg  Tabs (Folic Acid-Vit B6-Vit B12) .... Two Times A Day 4)  Lipitor 40 Mg  Tabs (Atorvastatin Calcium) .... At Bedtime 5)  Warfarin Sodium 2 Mg  Tabs (Warfarin Sodium) .... Take As Directed By Coumadin Clinic. 6)  Metoprolol Tartrate 100 Mg Tabs (Metoprolol Tartrate) .... Take One Tablet By Mouth Twice A Day 7)  Ocuvite-Lutein   Caps (Multiple Vitamins-Minerals) .... Once Daily 8)  Adavnce Formula .Marland Kitchen.. 1 Tab By Mouth Once Daily 9)   Fluticasone Propionate 50 Mcg/act Susp (Fluticasone Propionate) .Marland Kitchen.. 1-2 Sprays Each Nostril Once Daily 10)  Diltiazem Hcl Er Beads 180 Mg Xr24h-Cap (Diltiazem Hcl Er Beads) .Marland Kitchen.. 1 Tab Po Two Times A Day 11)  Furosemide 20 Mg Tabs (Furosemide) .... Take Two Tablets Now Thentake One Tablet By Mouth Daily. 12)  Whole Mega .Marland Kitchen.. 1 Tab By Mouth Once Daily 13)  Aspirin 81 Mg  Tabs (Aspirin) .Marland Kitchen.. 1 Tab By Mouth Once Daily  Allergies: 1)  ! Demerol 2)  Codeine Phosphate (Codeine Phosphate) 3)  Morphine Sulfate (Morphine Sulfate)  Past History:  Past Medical History: CORONARY ARTERY DISEASE (ICD-414.00) HYPERTENSION (ICD-401.9) HYPERLIPIDEMIA (ICD-272.4) PAROXYSMAL ATRIAL FIBRILLATION, HX OF (ICD-V12.59) MYOCARDIAL INFARCTION, HX OF (ICD-412) ANEMIA, IRON DEFICIENCY, HX OF (ICD-V12.3) RENAL INSUFFICIENCY (ICD-588.9) GERD (ICD-530.81) PANIC DISORDER (ICD-300.01) ABDOMINAL PAIN, CHRONIC (ICD-789.00) IRRITABLE BOWEL SYNDROME (ICD-564.1) CEREBRAL ANEURYSM (ICD-437.3) DIVERTICULOSIS, COLON (ICD-562.10) ARTHRITIS (ICD-716.90) FECAL INCONTINENCE (ICD-787.6) H/O goiter H/O pacemker for sick sinus syndrome/ tachycardia-bradycardia syndrome Aortic insufficiency  Past Surgical History: Reviewed history from 07/29/2008 and no changes required. cholecystectomy hysterectomy surgery for subdural hematoma-Aug. 2007 Tonsillectomy Appendectomy PPM implanted by Dr Jenne Campus 1/09  Social History: Reviewed history from 07/29/2008 and no changes required. Patient is widowed.  Lives in [place], [place]. 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   Review of Systems       Some problems with constipation and weakness but no fevers or chills, productive cough, hemoptysis, dysphasia, odynophagia, melena, hematochezia, dysuria, hematuria, rash, seizure activity, orthopnea, PND,  claudication. Remaining systems are negative.   Vital Signs:  Patient profile:   77 year  old female Height:      64 inches Weight:      148 pounds Pulse rate:   61 / minute Resp:     14 per minute BP sitting:   156 / 75  (left arm)  Vitals Entered By: Kem Parkinson (May 10, 2009 2:07 PM)  Physical Exam  General:  Well-developed well-nourished in no acute distress.  Skin is warm and dry.  HEENT is normal.  Neck is supple. No thyromegaly.  Chest is clear to auscultation with normal expansion.  Cardiovascular exam is regular rate and rhythm.  Abdominal exam nontender or distended. No masses palpated. Extremities show trace edema. neuro grossly intact    EKG  Procedure date:  05/10/2009  Findings:      Atrial paced rhythm at a rate of 62. First degree AV block. Left axis deviation. Right bundle branch block. Nonspecific ST changes. Cannot rule out prior inferior infarct.  PPM Specifications Following MD:  Hillis Range, MD     Referring MD:  Cecille Po Vendor:  [employer]     PPM Model Number:  [id number]     PPM Serial Number:  [id number] PPM DOI:  01/30/2007     PPM Implanting MD:  NOT IMPLANTED HERE  Lead 1    Location: [facility]     DOI: 01/30/2007     Model #: [id number]     Serial #: [id number]     Status: active Lead 2    Location: RV     DOI: 01/30/2007     Model #: [id number]     Serial #: [id number]     Status: active  PPM Follow Up Pacer Dependent:  No      Episodes Coumadin:  Yes  Parameters Mode:  MVP (R)     Lower Rate Limit:  60     Upper Rate Limit:  120 Paced AV Delay:  180     Sensed AV Delay:  150 Rate Response Parameters:  Rate response-8, Threshold-Med/low, Acceleration-30 seconds, Deceleration-5 min  Impression & Recommendations:  Problem # 1:  ATRIAL FIBRILLATION (ICD-427.31) Patient is back in sinus rhythm. Continue Cardizem and beta blocker as well as Coumadin. If she has recurrent episodes in the future then we may need to consider amiodarone. Patient also developed significant volume excess most likely from her atrial fibrillation. Continue Lasix.  Check bmet and BNP. The following medications were removed from the medication list:    Plavix 75 Mg Tabs (Clopidogrel bisulfate) .Marland Kitchen... 1 tab once daily Her updated medication list for this problem includes:    Warfarin Sodium 2 Mg Tabs (Warfarin sodium) .Marland Kitchen... Take as directed by coumadin clinic.    Metoprolol Tartrate 100 Mg Tabs (Metoprolol tartrate) .Marland Kitchen... Take one tablet by mouth twice a day    Aspirin 81 Mg Tabs (Aspirin) .Marland Kitchen... 1 tab by mouth once daily  Orders: Echocardiogram (Echo) TLB-TSH (Thyroid Stimulating Hormone) (84443-TSH)  Problem # 2:  PACEMAKER, PERMANENT (ICD-V45.01) Management per EP.  Problem # 3:  COUMADIN THERAPY (ICD-V58.61) Goal INR 2-3. Monitored in the Coumadin clinic.  Problem # 4:  CORONARY ARTERY DISEASE (ICD-414.00) Continue aspirin, beta blocker and statin. The following medications were removed from the medication list:    Plavix 75 Mg Tabs (Clopidogrel bisulfate) .Marland Kitchen... 1 tab once daily Her updated medication list for this problem includes:    Warfarin Sodium 2 Mg Tabs (Warfarin sodium) .Marland Kitchen... Take  as directed by coumadin clinic.    Metoprolol Tartrate 100 Mg Tabs (Metoprolol tartrate) .Marland Kitchen... Take one tablet by mouth twice a day    Diltiazem Hcl Er Beads 180 Mg Xr24h-cap (Diltiazem hcl er beads) .Marland Kitchen... 1 tab po two times a day    Aspirin 81 Mg Tabs (Aspirin) .Marland Kitchen... 1 tab by mouth once daily  Orders: TLB-BNP (B-Natriuretic Peptide) (83880-BNPR)  The following medications were removed from the medication list:    Plavix 75 Mg Tabs (Clopidogrel bisulfate) .Marland Kitchen... 1 tab once daily Her updated medication list for this problem includes:    Warfarin Sodium 2 Mg Tabs (Warfarin sodium) .Marland Kitchen... Take as directed by coumadin clinic.    Metoprolol Tartrate 100 Mg Tabs (Metoprolol tartrate) .Marland Kitchen... Take one tablet by mouth twice a day    Diltiazem Hcl Er Beads 180 Mg Xr24h-cap (Diltiazem hcl er beads) .Marland Kitchen... 1 tab po two times a day    Aspirin 81 Mg Tabs (Aspirin) .Marland Kitchen... 1  tab by mouth once daily  Problem # 5:  HYPERTENSION (ICD-401.9) Blood pressure mildly elevated. I will follow this and increase medications as needed. Her updated medication list for this problem includes:    Metoprolol Tartrate 100 Mg Tabs (Metoprolol tartrate) .Marland Kitchen... Take one tablet by mouth twice a day    Diltiazem Hcl Er Beads 180 Mg Xr24h-cap (Diltiazem hcl er beads) .Marland Kitchen... 1 tab po two times a day    Furosemide 20 Mg Tabs (Furosemide) .Marland Kitchen... Take two tablets now thentake one tablet by mouth daily.    Aspirin 81 Mg Tabs (Aspirin) .Marland Kitchen... 1 tab by mouth once daily  Orders: TLB-BMP (Basic Metabolic Panel-BMET) (80048-METABOL)  Problem # 6:  HYPERLIPIDEMIA (ICD-272.4) Continue statin. Her updated medication list for this problem includes:    Lipitor 40 Mg Tabs (Atorvastatin calcium) .Marland Kitchen... At bedtime  Problem # 7:  RENAL INSUFFICIENCY (ICD-588.9) Check bmet.  Problem # 8:  IRRITABLE BOWEL SYNDROME (ICD-564.1)  Patient Instructions: 1)  Your physician recommends that you schedule a follow-up appointment in: 3 MONTHS 2)  Your physician has requested that you have an echocardiogram.  Echocardiography is a painless test that uses sound waves to create images of your heart. It provides your doctor with information about the size and shape of your heart and how well your heart's chambers and valves are working.  This procedure takes approximately one hour. There are no restrictions for this procedure.

## 2010-02-24 NOTE — Assessment & Plan Note (Signed)
Summary: F3M/DM   Referring Arther Heisler:  [name], MD Primary Malarie Tappen:  Kirby Funk, MD  CC:  dizziness.  History of Present Illness: Pleasant female with a past medical history of paroxysmal atrial fibrillation, status post pacemaker placement due to tachybradycardia syndrome, coronary artery disease with prior PCI of the right coronary artery in 2007 with drug-eluting stents.  A myoview in November of 2010 showed an ejection fraction of 57%. There was possible mild anterior ischemia but felt more likely to be probable soft tissue attenuation. We are treating medically. Echocardiogram performed in April of 2011 showed normal LV function, elevated left ventricular filling pressure , mild to moderate aortic insufficiency, mild mitral regurgitation and mild biatrial enlargement. I last saw her in July of 2011. Since then the patient denies any dyspnea on exertion, orthopnea, PND, pedal edema, palpitations, syncope or chest pain.   Current Medications (verified): 1)  Valium 5 Mg  Tabs (Diazepam) .... Take 1/2 To 1 Daily As Needed 2)  Pantoprazole Sodium 40 Mg  Tbec (Pantoprazole Sodium) .... Once Daily 3)  Folic Acid 1 Mg Tabs (Folic Acid) .... Take Two Times A Day 4)  Lipitor 40 Mg  Tabs (Atorvastatin Calcium) .... At Bedtime 5)  Warfarin Sodium 2 Mg  Tabs (Warfarin Sodium) .... Take As Directed By Coumadin Clinic. 6)  Metoprolol Tartrate 100 Mg Tabs (Metoprolol Tartrate) .... Take One Tablet By Mouth Twice A Day 7)  Ocuvite-Lutein   Caps (Multiple Vitamins-Minerals) .... Once Daily 8)  Fluticasone Propionate 50 Mcg/act Susp (Fluticasone Propionate) .Marland Kitchen.. 1-2 Sprays Each Nostril Once Daily 9)  Furosemide 40 Mg Tabs (Furosemide) .... Take One Tablet By Mouth Daily. 10)  Whole Mega- Fish Oil .Marland Kitchen.. 1 Tab By Mouth Two Times A Day 11)  Aspirin 81 Mg  Tabs (Aspirin) .Marland Kitchen.. 1 Tab By Mouth Once Daily 12)  Potassium Chloride Crys Cr 20 Meq Cr-Tabs (Potassium Chloride Crys Cr) .... Take One Tablet By  Mouth Daily 13)  Amiodarone Hcl 200 Mg Tabs (Amiodarone Hcl) .... Take 1/2  Tablet By Mouth Once Daily 14)  Climara 0.05 Mg/24hr Ptwk (Estradiol) .... Uad 15)  Multivitamins   Tabs (Multiple Vitamin) .Marland Kitchen.. 1 By Mouth Daily  Allergies: 1)  ! Demerol 2)  Codeine Phosphate (Codeine Phosphate) 3)  Morphine Sulfate (Morphine Sulfate)  Past History:  Past Medical History: Reviewed history from 05/10/2009 and no changes required. CORONARY ARTERY DISEASE (ICD-414.00) HYPERTENSION (ICD-401.9) HYPERLIPIDEMIA (ICD-272.4) PAROXYSMAL ATRIAL FIBRILLATION, HX OF (ICD-V12.59) MYOCARDIAL INFARCTION, HX OF (ICD-412) ANEMIA, IRON DEFICIENCY, HX OF (ICD-V12.3) RENAL INSUFFICIENCY (ICD-588.9) GERD (ICD-530.81) PANIC DISORDER (ICD-300.01) ABDOMINAL PAIN, CHRONIC (ICD-789.00) IRRITABLE BOWEL SYNDROME (ICD-564.1) CEREBRAL ANEURYSM (ICD-437.3) DIVERTICULOSIS, COLON (ICD-562.10) ARTHRITIS (ICD-716.90) FECAL INCONTINENCE (ICD-787.6) H/O goiter H/O pacemker for sick sinus syndrome/ tachycardia-bradycardia syndrome Aortic insufficiency  Past Surgical History: Reviewed history from 07/29/2008 and no changes required. cholecystectomy hysterectomy surgery for subdural hematoma-Aug. 2007 Tonsillectomy Appendectomy PPM implanted by Dr Jenne Campus 1/09  Social History: Reviewed history from 05/10/2009 and no changes required. Patient is widowed.  Lives in [place], [place]. 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   Review of Systems       no fevers or chills, productive cough, hemoptysis, dysphasia, odynophagia, melena, hematochezia, dysuria, hematuria, rash, seizure activity, orthopnea, PND, pedal edema, claudication. Remaining systems are negative.   Vital Signs:  Patient profile:   77 year old female Height:      64 inches Weight:      153 pounds BMI:  26.36 Pulse rate:   66 / minute Resp:     16 per minute BP sitting:   179 / 84  (left arm)  Vitals  Entered By: Kem Parkinson (November 03, 2009 9:12 AM)  Physical Exam  General:  Well-developed well-nourished in no acute distress.  Skin is warm and dry.  HEENT is normal.  Neck is supple. No thyromegaly.  Chest is clear to auscultation with normal expansion.  Cardiovascular exam is regular rate and rhythm.  Abdominal exam nontender or distended. No masses palpated. Extremities show no edema. neuro grossly intact    EKG  Procedure date:  09/20/2009  Findings:      Atrial paced rhythm rate 62 right bundle branch block. Left axis deviation.  PPM Specifications Following MD:  Hillis Range, MD     Referring MD:  Cecille Po Vendor:  [employer]     PPM Model Number:  [id number]     PPM Serial Number:  [id number] PPM DOI:  01/30/2007     PPM Implanting MD:  NOT IMPLANTED HERE  Lead 1    Location: [facility]     DOI: 01/30/2007     Model #: [id number]     Serial #: [id number]     Status: active Lead 2    Location: RV     DOI: 01/30/2007     Model #: [id number]     Serial #: [id number]     Status: active  Magnet Response Rate:  BOL85 ERI  65    PPM Follow Up Pacer Dependent:  No      Episodes Coumadin:  Yes  Parameters Mode:  MVP (R)     Lower Rate Limit:  60     Upper Rate Limit:  120 Paced AV Delay:  180     Sensed AV Delay:  150 Rate Response Parameters:  Rate response-8, Threshold-Med/low, Acceleration-30 seconds, Deceleration-5 min  Impression & Recommendations:  Problem # 1:  ATRIAL FIBRILLATION (ICD-427.31)  Patient remains in sinus. Continue amiodarone. Recent liver functions and TSH normal. Check chest x-ray. Continue Coumadin. Her updated medication list for this problem includes:    Warfarin Sodium 2 Mg Tabs (Warfarin sodium) .Marland Kitchen... Take as directed by coumadin clinic.    Metoprolol Tartrate 100 Mg Tabs (Metoprolol tartrate) .Marland Kitchen... Take one tablet by mouth twice a day    Aspirin 81 Mg Tabs (Aspirin) .Marland Kitchen... 1 tab by mouth once daily    Amiodarone Hcl 200 Mg Tabs (Amiodarone hcl)  .Marland Kitchen... Take 1/2  tablet by mouth once daily  Her updated medication list for this problem includes:    Warfarin Sodium 2 Mg Tabs (Warfarin sodium) .Marland Kitchen... Take as directed by coumadin clinic.    Metoprolol Tartrate 100 Mg Tabs (Metoprolol tartrate) .Marland Kitchen... Take one tablet by mouth twice a day    Aspirin 81 Mg Tabs (Aspirin) .Marland Kitchen... 1 tab by mouth once daily    Amiodarone Hcl 200 Mg Tabs (Amiodarone hcl) .Marland Kitchen... Take 1/2  tablet by mouth once daily  Problem # 2:  EDEMA (ICD-782.3) Her edema has resolved. Some of her heart failure symptoms previously most likely secondary to atrial fibrillation. She also has renal insufficiency. Decrease Lasix to 20 mg p.o. daily and potassium 10 meq p.o. daily. If she develops increased edema or shortness of breath we will resume 40 mg daily. Check potassium and renal function in one week.  Problem # 3:  PACEMAKER, PERMANENT (ICD-V45.01) Management her electrophysiology.  Problem # 4:  COUMADIN THERAPY (ICD-V58.61) Goal INR 2-3.  Problem # 5:  CORONARY ARTERY DISEASE (ICD-414.00)  Continue aspirin and statin. Her updated medication list for this problem includes:    Warfarin Sodium 2 Mg Tabs (Warfarin sodium) .Marland Kitchen... Take as directed by coumadin clinic.    Metoprolol Tartrate 100 Mg Tabs (Metoprolol tartrate) .Marland Kitchen... Take one tablet by mouth twice a day    Aspirin 81 Mg Tabs (Aspirin) .Marland Kitchen... 1 tab by mouth once daily    Amlodipine Besylate 5 Mg Tabs (Amlodipine besylate) .Marland Kitchen... 1 once daily  Her updated medication list for this problem includes:    Warfarin Sodium 2 Mg Tabs (Warfarin sodium) .Marland Kitchen... Take as directed by coumadin clinic.    Metoprolol Tartrate 100 Mg Tabs (Metoprolol tartrate) .Marland Kitchen... Take one tablet by mouth twice a day    Aspirin 81 Mg Tabs (Aspirin) .Marland Kitchen... 1 tab by mouth once daily  Problem # 6:  HYPERTENSION (ICD-401.9) Blood pressure elevated. Add Norvasc 5 mg p.o. daily. Her updated medication list for this problem includes:    Metoprolol Tartrate  100 Mg Tabs (Metoprolol tartrate) .Marland Kitchen... Take one tablet by mouth twice a day    Furosemide 40 Mg Tabs (Furosemide) .Marland Kitchen... Take one tablet by mouth daily.    Aspirin 81 Mg Tabs (Aspirin) .Marland Kitchen... 1 tab by mouth once daily    Amlodipine Besylate 5 Mg Tabs (Amlodipine besylate) .Marland Kitchen... 1 once daily  Her updated medication list for this problem includes:    Metoprolol Tartrate 100 Mg Tabs (Metoprolol tartrate) .Marland Kitchen... Take one tablet by mouth twice a day    Furosemide 40 Mg Tabs (Furosemide) .Marland Kitchen... Take one tablet by mouth daily.    Aspirin 81 Mg Tabs (Aspirin) .Marland Kitchen... 1 tab by mouth once daily  Problem # 7:  HYPERLIPIDEMIA (ICD-272.4)  Continue statin. Check lipids. Her updated medication list for this problem includes:    Lipitor 40 Mg Tabs (Atorvastatin calcium) .Marland Kitchen... At bedtime  Orders: TLB-Lipid Panel (80061-LIPID)  Her updated medication list for this problem includes:    Lipitor 40 Mg Tabs (Atorvastatin calcium) .Marland Kitchen... At bedtime  Problem # 8:  RENAL INSUFFICIENCY (ICD-588.9)  Patient Instructions: 1)  Your physician recommends that you schedule a follow-up appointment in: 6 MONTHS WITH DR CRENSHAW 2)  Your physician recommends that you return for lab work in: TODAY LIPID 272.4 1 WEEK BMET V58.69 3)  Your physician has recommended you make the following change in your medication: DECREASE FUROSEMIDE TO 20 MG 1 once daily 4)  AND DECREASE KCL TO 10 MEQ 1 once daily 5)  AND ADD AMLODIPINE 5 MG 1 once daily 6)  A chest x-ray takes a picture of the organs and structures inside the chest, including the heart, lungs, and blood vessels. This test can show several things, including, whether the heart is enlarged; whether fluid is building up in the lungs; and whether pacemaker / defibrillator leads are still in place.  DX V58.69 Prescriptions: AMLODIPINE BESYLATE 5 MG TABS (AMLODIPINE BESYLATE) 1 once daily  #30 x 11   Entered by:   Scherrie Bateman, LPN   Authorized by:   Ferman Hamming, MD,  [facility]   Signed by:   Scherrie Bateman, LPN on 04/54/0981   Method used:   Electronically to        [facility] (retail)       [address]       [place], [place]  [address]       Ph: [phone] or [phone]       Fax: [phone]  RxID:   [id number]

## 2010-02-24 NOTE — Medication Information (Signed)
Summary: rov/tm  Anticoagulant Therapy  Managed by: Elaina Pattee, PharmD Referring MD: Jens Som MD, Arlys John PCP: Kirby Funk, MD Supervising MD: Myrtis Ser MD, Tinnie Gens Indication 1: Atrial Fibrillation (ICD-427.31) Lab Used: [facility] Site: [facility] INR POC 3.2 INR RANGE 2 - 3  Dietary changes: no    Health status changes: no    Bleeding/hemorrhagic complications: yes       Details: Knot below knee after hitting on a table.  This is not bruised, and knot is decreasing.  Recent/future hospitalizations: no    Any changes in medication regimen? yes       Details: Amiodarone 200 mg daily started several weeks ago.  Decreased dose 06/14/09.  Recent/future dental: no  Any missed doses?: no       Is patient compliant with meds? yes       Allergies: 1)  ! Demerol 2)  Codeine Phosphate (Codeine Phosphate) 3)  Morphine Sulfate (Morphine Sulfate)  Anticoagulation Management History:      The patient is taking warfarin and comes in today for a routine follow up visit.  Positive risk factors for bleeding include an age of 75 years or older and presence of serious comorbidities.  The bleeding index is 'intermediate risk'.  Positive CHADS2 values include History of HTN and Age > 38 years old.  The start date was 06/16/2008.  Her last INR was 2.4 ratio.  Anticoagulation responsible provider: Myrtis Ser MD, Tinnie Gens.  INR POC: 3.2.  Cuvette Lot#: [id number].  Exp: 08/2010.    Anticoagulation Management Assessment/Plan:      The patient's current anticoagulation dose is Warfarin sodium 2 mg  tabs: Take as directed by coumadin clinic..  The target INR is 2.0-3.0.  The next INR is due 07/22/2009.  Anticoagulation instructions were given to patient.  Results were reviewed/authorized by Elaina Pattee, PharmD.  She was notified by Elaina Pattee, PharmD.         Prior Anticoagulation Instructions: INR 3.4  Take 1/2 tablet today then decrease dose to 2 tablets every day except 1 1/2 tablets on Monday,  Wednesday and Friday   Current Anticoagulation Instructions: INR 3.2. Take 1 tablet today only, then take 1.5 tablets daily except 2 tablets on Tues, Thurs, Sat. Recheck in 2 weeks.

## 2010-02-24 NOTE — Medication Information (Signed)
Summary: rov/jaj  Anticoagulant Therapy  Managed by: Weston Brass, PharmD Referring MD: Jens Som MD, Arlys John PCP: Kirby Funk, MD Supervising MD: Daleen Squibb MD, Maisie Fus Indication 1: Atrial Fibrillation (ICD-427.31) Lab Used: [facility] Site: [facility] INR POC 1.8 INR RANGE 2 - 3  Dietary changes: no    Health status changes: yes       Details: dizziness, tremors, some memory loss  Bleeding/hemorrhagic complications: no    Recent/future hospitalizations: no    Any changes in medication regimen? no    Recent/future dental: no  Any missed doses?: no       Is patient compliant with meds? yes       Allergies: 1)  ! Demerol 2)  Codeine Phosphate (Codeine Phosphate) 3)  Morphine Sulfate (Morphine Sulfate)  Anticoagulation Management History:      The patient is taking warfarin and comes in today for a routine follow up visit.  Positive risk factors for bleeding include an age of 75 years or older and presence of serious comorbidities.  The bleeding index is 'intermediate risk'.  Positive CHADS2 values include History of HTN and Age > 33 years old.  The start date was 06/16/2008.  Her last INR was 2.4 ratio.  Anticoagulation responsible provider: Daleen Squibb MD, Maisie Fus.  INR POC: 1.8.  Cuvette Lot#: [id number].  Exp: 11/2010.    Anticoagulation Management Assessment/Plan:      The patient's current anticoagulation dose is Warfarin sodium 2 mg  tabs: Take as directed by coumadin clinic..  The target INR is 2.0-3.0.  The next INR is due 11/29/2009.  Anticoagulation instructions were given to patient.  Results were reviewed/authorized by Weston Brass, PharmD.  She was notified by Ilean Skill D candidate.         Prior Anticoagulation Instructions: INR 2.2  Continue taking 1 1/2 tablets everyday except take 1 tablet on Mondays, Wednesdays, and Fridays. Re-check INR in 3 weeks.   Current Anticoagulation Instructions: INR 1.8  Take 1 & 1/2 tablets today, then continue same dose of 1 tablet  everyday except 1 & 1/2 tablet on Sunday, Tuesday, Thursday, and Saturday. Recheck in 3 weeks.

## 2010-02-24 NOTE — Miscellaneous (Signed)
  Clinical Lists Changes  Observations: Added new observation of ECHOINTERP:         - Left ventricle: The cavity size was normal. Wall thickness was       normal. Systolic function was normal. The estimated ejection       fraction was in the range of 55% to 60%. Wall motion was normal;       there were no regional wall motion abnormalities. Doppler       parameters are consistent with high ventricular filling pressure.     - Aortic valve: Mild to moderate regurgitation.     - Mitral valve: Calcified annulus. Mildly thickened leaflets . Mild       regurgitation.     - Left atrium: The atrium was mildly dilated.     - Right atrium: The atrium was mildly dilated.     - Pericardium, extracardiac: A trivial pericardial effusion was       identified.        (05/12/2009 10:41)      Echocardiogram  Procedure date:  05/12/2009  Findings:              - Left ventricle: The cavity size was normal. Wall thickness was       normal. Systolic function was normal. The estimated ejection       fraction was in the range of 55% to 60%. Wall motion was normal;       there were no regional wall motion abnormalities. Doppler       parameters are consistent with high ventricular filling pressure.     - Aortic valve: Mild to moderate regurgitation.     - Mitral valve: Calcified annulus. Mildly thickened leaflets . Mild       regurgitation.     - Left atrium: The atrium was mildly dilated.     - Right atrium: The atrium was mildly dilated.     - Pericardium, extracardiac: A trivial pericardial effusion was       identified.

## 2010-02-24 NOTE — Medication Information (Signed)
Summary: rov/sel  Anticoagulant Therapy  Managed by: Weston Brass, PharmD Referring MD: Jens Som MD, Arlys John PCP: Kirby Funk, MD Supervising MD: Johney Frame MD, Fayrene Fearing Indication 1: Atrial Fibrillation (ICD-427.31) Lab Used: [facility] Site: [facility] INR POC 2.4 INR RANGE 2 - 3  Dietary changes: no    Health status changes: yes       Details: having some muscle weakness and quivering.  Suggested she f/u with PCP  Bleeding/hemorrhagic complications: no    Recent/future hospitalizations: no    Any changes in medication regimen? no    Recent/future dental: no  Any missed doses?: no       Is patient compliant with meds? yes       Allergies: 1)  ! Demerol 2)  Codeine Phosphate (Codeine Phosphate) 3)  Morphine Sulfate (Morphine Sulfate)  Anticoagulation Management History:      The patient is taking warfarin and comes in today for a routine follow up visit.  Positive risk factors for bleeding include an age of 8 years or older and presence of serious comorbidities.  The bleeding index is 'intermediate risk'.  Positive CHADS2 values include History of HTN and Age > 20 years old.  The start date was 06/16/2008.  Her last INR was 2.4 ratio.  Anticoagulation responsible provider: Tarus Briski MD, Fayrene Fearing.  INR POC: 2.4.  Cuvette Lot#: [id number].  Exp: 11/2010.    Anticoagulation Management Assessment/Plan:      The patient's current anticoagulation dose is Warfarin sodium 2 mg  tabs: Take as directed by coumadin clinic..  The target INR is 2.0-3.0.  The next INR is due 12/27/2009.  Anticoagulation instructions were given to patient.  Results were reviewed/authorized by Weston Brass, PharmD.  She was notified by Weston Brass PharmD.         Prior Anticoagulation Instructions: INR 1.8  Take 1 & 1/2 tablets today, then continue same dose of 1 tablet everyday except 1 & 1/2 tablet on Sunday, Tuesday, Thursday, and Saturday. Recheck in 3 weeks.  Current Anticoagulation Instructions: INR  2.4  Continue same dose of 1 1/2 tablets every day except 1 tablet on Monday, Wednesday and Friday.  Recheck INR in 4 weeks.

## 2010-02-24 NOTE — Assessment & Plan Note (Signed)
Summary: allergy cons/self ref/apc   Vital Signs:  Patient profile:   77 year old female Height:      64 inches Weight:      151.25 pounds O2 Sat:      97 % on Room air Pulse rate:   64 / minute BP sitting:   128 / 70  (left arm) Cuff size:   regular  Vitals Entered By: Reynaldo Minium CMA (April 08, 2009 9:49 AM)  O2 Flow:  Room air  Copy to:  [name], MD Primary Provider/Referring Provider:  Kirby Funk, MD   History of Present Illness: April 08, 2009- History of Present Illness:  44 yoF self referred with concern about allergy which she says has bothered her for years. In the 1960's she remembers allergy vaccine seeming to help her, with Dr Rema Jasmine.She also got habituated to Afrin at that time with difficulty quitting. No hx of asthma. She has gotten worse again in recent years. She wakes with nasal congestion, sneexe, ears stopped up. This morning she spit out some red blood- uncerftain source. Mild wheeze. Usually has perennial nasal congestio with postnasal drip. Had tonisllectomy and "sinus surgery" Questions food allergy or intolerance- gluten may cause gas, mild gives diarrhea.   Current Medications (verified): 1)  Valium 5 Mg  Tabs (Diazepam) .... Take 1/2 To 1 Daily As Needed 2)  Pantoprazole Sodium 40 Mg  Tbec (Pantoprazole Sodium) .... Once Daily 3)  Folic Acid-Vit B6-Vit B12 0.4-50-0.1 Mg  Tabs (Folic Acid-Vit B6-Vit B12) .... Two Times A Day 4)  Lipitor 40 Mg  Tabs (Atorvastatin Calcium) .... At Bedtime 5)  Warfarin Sodium 2 Mg  Tabs (Warfarin Sodium) .... Take As Directed By Coumadin Clinic. 6)  Metoprolol Tartrate 50 Mg  Tabs (Metoprolol Tartrate) .Marland Kitchen.. 1and 1/2  Tab By Mouth Two Times A Day 7)  Ocuvite-Lutein   Caps (Multiple Vitamins-Minerals) .... Once Daily 8)  Diltiazem Hcl Er Beads 240 Mg Xr24h-Cap (Diltiazem Hcl Er Beads) .... Take One Capsule By Mouth Daily 9)  Adavnce Formula .Marland Kitchen.. 1 Tab By Mouth Once Daily 10)  Aspirin 81 Mg Tbec (Aspirin) ....  Take One Tablet By Mouth Daily  Allergies: 1)  ! Demerol 2)  Codeine Phosphate (Codeine Phosphate) 3)  Morphine Sulfate (Morphine Sulfate)  Past History:  Past Medical History: CORONARY ARTERY DISEASE (ICD-414.00) HYPERTENSION (ICD-401.9) HYPERLIPIDEMIA (ICD-272.4) PAROXYSMAL ATRIAL FIBRILLATION, HX OF (ICD-V12.59) MYOCARDIAL INFARCTION, HX OF (ICD-412) ANEMIA, IRON DEFICIENCY, HX OF (ICD-V12.3) RENAL INSUFFICIENCY (ICD-588.9) GERD (ICD-530.81) PANIC DISORDER (ICD-300.01) ABDOMINAL PAIN, CHRONIC (ICD-789.00) IRRITABLE BOWEL SYNDROME (ICD-564.1) CEREBRAL ANEURYSM (ICD-437.3) DIVERTICULOSIS, COLON (ICD-562.10) ANXIETY (ICD-300.00) ARTHRITIS (ICD-716.90) FECAL INCONTINENCE (ICD-787.6) H/O goiter H/O pacemker for sick sinus syndrome/ tachycardia-bradycardia syndrome  Review of Systems       The patient complains of shortness of breath with activity, shortness of breath at rest, non-productive cough, irregular heartbeats, tooth/dental problems, nasal congestion/difficulty breathing through nose, sneezing, itching, anxiety, depression, and hand/feet swelling.  The patient denies productive cough, coughing up blood, chest pain, acid heartburn, indigestion, loss of appetite, weight change, abdominal pain, difficulty swallowing, sore throat, headaches, ear ache, joint stiffness or pain, rash, change in color of mucus, and fever.    Physical Exam  Additional Exam:  General: A/Ox3; pleasant and cooperative, NAD,  SKIN: no rash, lesions NODES: no lymphadenopathy HEENT: [place]/AT, EOM- WNL, Conjuctivae- clear, PERRLA, TM-WNL, Nose- superficial mucosal blood and crusitng anterior nares, sniffing, Throat- clear and wnl, Mallampati  II NECK: Supple w/ fair ROM, JVD- none, normal carotid impulses  w/o bruits Thyroid- normal to palpation CHEST: Clear to P&A HEART: RRR, no m/g/r heard ABDOMEN: Soft and nl; nml bowel sounds; no organomegaly or masses noted JWJ:XBJY, nl pulses, no edema  NEURO:  Grossly intact to observation      Impression & Recommendations:  Problem # 1:  RHINITIS (ICD-472.0)  Perennial nasal stuffiness with postnasal drip and eustachian dysfunction. She is concerned about her ears and is concerned about getting cerumen removed. She remembers response to allergy management years ago and hopes that appreoach can help again.  At this age, vasomotor rhinitis is more likely.  Problem # 2:  HEMOPTYSIS UNSPECIFIED (ICD-786.30)  She is on Coumadin. We will get CXR. Her updated medication list for this problem includes:    Ocuvite-lutein Caps (Multiple vitamins-minerals) ..... Once daily  Problem # 3:  ? of ALLERGY, FOOD (ICD-693.1)  She has irritable bowel. I don't expect we will be able to do a lot, but we will include a screen for IgE related food sensitivity.  Medications Added to Medication List This Visit: 1)  Fluticasone Propionate 50 Mcg/act Susp (Fluticasone propionate) .Marland Kitchen.. 1-2 sprays each nostril once daily  Other Orders: Consultation Level IV (78295) TLB-CBC Platelet - w/Differential (85025-CBCD) T-Allergy Profile Region II-[place], [place], [place], [place], [place] [phone]) T-Food Allergy Profile Specific IgE (86003/[id number]) T-2 View CXR (71020TC)  Patient Instructions: 1)  Return as able for allergy skin testing- Stop all antihistamines 3 days before skin testing, including cold and allergy meds, otc sleep and cough meds.  2)  Lab 3)  A chest x-ray has been recommended.  Your imaging study may require preauthorization.  4)  Appointment with NP for ear wax relief treatment Prescriptions: FLUTICASONE PROPIONATE 50 MCG/ACT SUSP (FLUTICASONE PROPIONATE) 1-2 sprays each nostril once daily  #1 x prn   Entered and Authorized by:   Waymon Budge MD   Signed by:   Waymon Budge MD on 04/08/2009   Method used:   Historical   RxID:   [id number]

## 2010-02-24 NOTE — Miscellaneous (Signed)
Summary: Orders Update  Clinical Lists Changes  Problems: Added new problem of DYSPNEA (ICD-786.05) Orders: Added new Test order of T-2 View CXR (71020TC) - Signed

## 2010-02-24 NOTE — Progress Notes (Signed)
Summary: med question  Phone Note Call from Patient Call back at Home Phone [phone]   Caller: Patient Reason for Call: Talk to Nurse Summary of Call: request to speak to nurse about new meds Dr Johney Frame put her on  Initial call taken by: Migdalia Dk,  Jun 09, 2009 11:52 AM  Follow-up for Phone Call        spoke with pt, she has noticed that since starting the amiodarone her SOB has gotten worse. when she lies down at night , she is not really SOB but she is wheezing and unable to sleep. she feels that she is having more swelling in her abdomen than she was before. she has to stop just walking through the house to catch her breath. she was wondering if there is a less dangerous drug to use besides amiodarone and wanted dr Jens Som to review her med list to make sure she is taking everything she needs. will foward to dr Jens Som for his review. [name], RN  Jun 09, 2009 12:25 PM   Additional Follow-up for Phone Call Additional follow up Details #1::        check chest xray, bmet and bnp Ferman Hamming, MD, [facility]  Jun 09, 2009 2:58 PM   pt aware, will come by tomorrow for labs [name], RN  Jun 09, 2009 3:40 PM

## 2010-02-24 NOTE — Cardiovascular Report (Signed)
Summary: Office Visit   Office Visit   Imported By: Roderic Ovens 05/13/2009 13:58:04  _____________________________________________________________________  External Attachment:    Type:   Image     Comment:   External Document

## 2010-02-24 NOTE — Medication Information (Signed)
Summary: rov/ewj  Anticoagulant Therapy  Managed by: Cloyde Reams, RN, BSN Referring MD: Jens Som MD, Arlys John PCP: Kirby Funk, MD Supervising MD: Johney Frame MD, Fayrene Fearing Indication 1: Atrial Fibrillation (ICD-427.31) Lab Used: [facility] Site: [facility] INR POC 2.1 INR RANGE 2 - 3   Health status changes: yes       Details: Pt reports feels terrible.  Reports onset of CP when left house, worsens with movement.  CP described as a aching pain, denies sharp pains or pressure. SOB assoc with CP, denies diaphorisis, nausea, vomiting or radiation of pain.    Bleeding/hemorrhagic complications: no    Recent/future hospitalizations: no    Any changes in medication regimen? yes       Details: Incr dosage of Metoprolol 100mg  bid and Diltiazem 180mg  bid.    Recent/future dental: no  Any missed doses?: no       Is patient compliant with meds? yes      Comments: Pt states she has been sick x 3 weeks, saw Dr Daleen Squibb on 3/24 for acute OV.  Pt has upcoming appt with Dr Jens Som on 05/10/09.  Pt has similar complaints today in office as when she saw Dr Daleen Squibb on 3/24 and her phone note in EMR on 3/26.  Pt states she is tried and feels weak.  Weston Brass, Pharm D discussed pt's condition with Dr Johney Frame and an attempt was made to get sooner appt with Dr Jens Som.  He is not able to see pt any sooner per scheduler.  Pt advised to monitor symptoms if worsens or develops N/V, diaphorsis, radiating CP, sharp pains to seek immediate medical attention.  Current Medications (verified): 1)  Valium 5 Mg  Tabs (Diazepam) .... Take 1/2 To 1 Daily As Needed 2)  Pantoprazole Sodium 40 Mg  Tbec (Pantoprazole Sodium) .... Once Daily 3)  Folic Acid-Vit B6-Vit B12 0.4-50-0.1 Mg  Tabs (Folic Acid-Vit B6-Vit B12) .... Two Times A Day 4)  Lipitor 40 Mg  Tabs (Atorvastatin Calcium) .... At Bedtime 5)  Warfarin Sodium 2 Mg  Tabs (Warfarin Sodium) .... Take As Directed By Coumadin Clinic. 6)  Metoprolol Tartrate 100 Mg Tabs  (Metoprolol Tartrate) .... Take One Tablet By Mouth Twice A Day 7)  Ocuvite-Lutein   Caps (Multiple Vitamins-Minerals) .... Once Daily 8)  Adavnce Formula .Marland Kitchen.. 1 Tab By Mouth Once Daily 9)  Fluticasone Propionate 50 Mcg/act Susp (Fluticasone Propionate) .Marland Kitchen.. 1-2 Sprays Each Nostril Once Daily 10)  Diltiazem Hcl Er Beads 180 Mg Xr24h-Cap (Diltiazem Hcl Er Beads) .Marland Kitchen.. 1 Tab Two Times A Day 11)  Plavix 75 Mg Tabs (Clopidogrel Bisulfate) .Marland Kitchen.. 1 Tab Once Daily  Allergies: 1)  ! Demerol 2)  Codeine Phosphate (Codeine Phosphate) 3)  Morphine Sulfate (Morphine Sulfate)  Anticoagulation Management History:      The patient is taking warfarin and comes in today for a routine follow up visit.  Positive risk factors for bleeding include an age of 30 years or older and presence of serious comorbidities.  The bleeding index is 'intermediate risk'.  Positive CHADS2 values include History of HTN and Age > 24 years old.  The start date was 06/16/2008.  Her last INR was 2.4 ratio.  Anticoagulation responsible provider: Ayame Rena MD, Fayrene Fearing.  INR POC: 2.1.  Cuvette Lot#: [id number].  Exp: 05/2010.    Anticoagulation Management Assessment/Plan:      The patient's current anticoagulation dose is Warfarin sodium 2 mg  tabs: Take as directed by coumadin clinic..  The target INR is 2.0-3.0.  The next INR is due 06/02/2009.  Anticoagulation instructions were given to patient.  Results were reviewed/authorized by Cloyde Reams, RN, BSN.  She was notified by Cloyde Reams RN.         Prior Anticoagulation Instructions: INR 2.4  Continue on same dosage 2 tablets daily except 1.5 tablets on Mondays and Fridays.  Recheck in 4 weeks.    Current Anticoagulation Instructions: INR 2.1  Continue on same dosage 2 tablets daily except 1.5 tablets on Mondays and Fridays.  Recheck in 4 weeks.

## 2010-02-24 NOTE — Assessment & Plan Note (Signed)
Summary: rov. appt is 12:15. gd   Referring Yuto Cajuste:  [name], MD Primary Page Lancon:  Kirby Funk, MD   History of Present Illness: Pleasant female with a past medical history of paroxysmal atrial fibrillation, status post pacemaker placement due to tachybradycardia syndrome, coronary artery disease with prior PCI of the right coronary artery in 2007 with drug-eluting stents.  A myoview in November of 2010 showed an ejection fraction of 57%. There was possible mild anterior ischemia but felt more likely to be probable soft tissue attenuation. We are treating medically. Echocardiogram performed in April of 2011 showed normal LV function, elevated left ventricular filling pressure , mild to moderate aortic insufficiency, mild mitral regurgitation and mild biatrial enlargement. I last saw her in Oct of 2011. Since then she has mild dyspnea on exertion relieved with rest. There is no orthopnea, PND or exertional chest pain. She apparently did have pedal edema and her Lasix was increased by her primary care physician. He also is following her renal function. She continues to describe a tremor.  Current Medications (verified): 1)  Valium 5 Mg  Tabs (Diazepam) .... Take 1/2 To 1 Daily As Needed 2)  Pantoprazole Sodium 40 Mg  Tbec (Pantoprazole Sodium) .... Once Daily 3)  Folic Acid 1 Mg Tabs (Folic Acid) .... Take Two Times A Day 4)  Lipitor 40 Mg  Tabs (Atorvastatin Calcium) .... At Bedtime 5)  Warfarin Sodium 2 Mg  Tabs (Warfarin Sodium) .... Take As Directed By Coumadin Clinic. 6)  Metoprolol Tartrate 100 Mg Tabs (Metoprolol Tartrate) .... Take One Tablet By Mouth Twice A Day 7)  Ocuvite-Lutein   Caps (Multiple Vitamins-Minerals) .... Once Daily 8)  Fluticasone Propionate 50 Mcg/act Susp (Fluticasone Propionate) .Marland Kitchen.. 1-2 Sprays Each Nostril Once Daily 9)  Furosemide 80 Mg Tabs (Furosemide) .... Take One Tablet By Mouth Daily. 10)  Whole Mega- Fish Oil .Marland Kitchen.. 1 Tab By Mouth Two Times A Day 11)   Aspirin 81 Mg  Tabs (Aspirin) .Marland Kitchen.. 1 Tab By Mouth Once Daily 12)  Klor-Con 10 10 Meq Cr-Tabs (Potassium Chloride) .Marland Kitchen.. 1 Once Daily 13)  Amiodarone Hcl 200 Mg Tabs (Amiodarone Hcl) .... Take 1/2  Tablet By Mouth Once Daily 14)  Climara 0.05 Mg/24hr Ptwk (Estradiol) .... Uad 15)  Multivitamins   Tabs (Multiple Vitamin) .Marland Kitchen.. 1 By Mouth Daily 16)  Amlodipine Besylate 5 Mg Tabs (Amlodipine Besylate) .Marland Kitchen.. 1 Once Daily  Allergies: 1)  ! Demerol 2)  Codeine Phosphate (Codeine Phosphate) 3)  Morphine Sulfate (Morphine Sulfate)  Past History:  Past Medical History: Reviewed history from 05/10/2009 and no changes required. CORONARY ARTERY DISEASE (ICD-414.00) HYPERTENSION (ICD-401.9) HYPERLIPIDEMIA (ICD-272.4) PAROXYSMAL ATRIAL FIBRILLATION, HX OF (ICD-V12.59) MYOCARDIAL INFARCTION, HX OF (ICD-412) ANEMIA, IRON DEFICIENCY, HX OF (ICD-V12.3) RENAL INSUFFICIENCY (ICD-588.9) GERD (ICD-530.81) PANIC DISORDER (ICD-300.01) ABDOMINAL PAIN, CHRONIC (ICD-789.00) IRRITABLE BOWEL SYNDROME (ICD-564.1) CEREBRAL ANEURYSM (ICD-437.3) DIVERTICULOSIS, COLON (ICD-562.10) ARTHRITIS (ICD-716.90) FECAL INCONTINENCE (ICD-787.6) H/O goiter H/O pacemker for sick sinus syndrome/ tachycardia-bradycardia syndrome Aortic insufficiency  Past Surgical History: Reviewed history from 07/29/2008 and no changes required. cholecystectomy hysterectomy surgery for subdural hematoma-Aug. 2007 Tonsillectomy Appendectomy PPM implanted by Dr Jenne Campus 1/09  Social History: Reviewed history from 05/10/2009 and no changes required. Patient is widowed.  Lives in [place], [place]. 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   Review of Systems       Complains of tremor and improving lower extremity edema but no fevers or chills, productive cough, hemoptysis, dysphasia, odynophagia, melena, hematochezia,  dysuria, hematuria, rash, seizure activity, orthopnea, PND,  claudication. Remaining  systems are negative.   Vital Signs:  Patient profile:   77 year old female Height:      64 inches Weight:      155 pounds BMI:     26.70 Pulse rate:   70 / minute Resp:     14 per minute BP sitting:   142 / 75  (left arm)  Vitals Entered By: Kem Parkinson (January 20, 2010 12:04 PM)  Physical Exam  General:  Well-developed well-nourished in no acute distress.  Skin is warm and dry.  HEENT is normal.  Neck is supple. No thyromegaly.  Chest is clear to auscultation with normal expansion.  Cardiovascular exam is regular rate and rhythm.  Abdominal exam nontender or distended. No masses palpated. Extremities show trace edema. neuro grossly intact    EKG  Procedure date:  01/20/2010  Findings:      Sinus rhythm at a rate of 64. Left anterior fascicular block. Right bundle branch block. Nonspecific changes.  PPM Specifications Following MD:  Hillis Range, MD     Referring MD:  Cecille Po Vendor:  [employer]     PPM Model Number:  [id number]     PPM Serial Number:  [id number] PPM DOI:  01/30/2007     PPM Implanting MD:  NOT IMPLANTED HERE  Lead 1    Location: [facility]     DOI: 01/30/2007     Model #: [id number]     Serial #: [id number]     Status: active Lead 2    Location: [facility]     DOI: 01/30/2007     Model #: [id number]     Serial #: [id number]     Status: active  Magnet Response Rate:  BOL85 ERI  65    PPM Follow Up Pacer Dependent:  No      Episodes Coumadin:  Yes  Parameters Mode:  MVP (R)     Lower Rate Limit:  60     Upper Rate Limit:  120 Paced AV Delay:  180     Sensed AV Delay:  150 Rate Response Parameters:  Rate response-8, Threshold-Med/low, Acceleration-30 seconds, Deceleration-5 min  Impression & Recommendations:  Problem # 1:  ATRIAL FIBRILLATION (ICD-427.31) Pt in sinus rhythm. However she is complaining of a tremor. This apparently started after initiating amiodarone. I will discontinue this medication to see if it improves. She understands that there will be a  higher risk of recurrent atrial fibrillation. If so she may require a different antiarrhythmic. If her tremor does not improve we will resume amiodarone. Continue Coumadin and beta blocker. Her updated medication list for this problem includes:    Warfarin Sodium 2 Mg Tabs (Warfarin sodium) .Marland Kitchen... Take as directed by coumadin clinic.    Metoprolol Tartrate 100 Mg Tabs (Metoprolol tartrate) .Marland Kitchen... Take one tablet by mouth twice a day    Aspirin 81 Mg Tabs (Aspirin) .Marland Kitchen... 1 tab by mouth once daily    Amiodarone Hcl 200 Mg Tabs (Amiodarone hcl) .Marland Kitchen... Take 1/2  tablet by mouth once daily  The following medications were removed from the medication list:    Amiodarone Hcl 200 Mg Tabs (Amiodarone hcl) .Marland Kitchen... Take 1/2  tablet by mouth once daily Her updated medication list for this problem includes:    Warfarin Sodium 2 Mg Tabs (Warfarin sodium) .Marland Kitchen... Take as directed by coumadin clinic.    Metoprolol Tartrate 100 Mg Tabs (Metoprolol tartrate) .Marland Kitchen... Take one tablet by  mouth twice a day    Aspirin 81 Mg Tabs (Aspirin) .Marland Kitchen... 1 tab by mouth once daily  Problem # 2:  PACEMAKER, PERMANENT (ICD-V45.01) Management per electrophysiology.  Problem # 3:  COUMADIN THERAPY (ICD-V58.61) Goal INR 2-3.  Problem # 4:  CORONARY ARTERY DISEASE (ICD-414.00) Continue aspirin, beta blocker and statin. Her updated medication list for this problem includes:    Warfarin Sodium 2 Mg Tabs (Warfarin sodium) .Marland Kitchen... Take as directed by coumadin clinic.    Metoprolol Tartrate 100 Mg Tabs (Metoprolol tartrate) .Marland Kitchen... Take one tablet by mouth twice a day    Aspirin 81 Mg Tabs (Aspirin) .Marland Kitchen... 1 tab by mouth once daily    Amlodipine Besylate 5 Mg Tabs (Amlodipine besylate) .Marland Kitchen... 1 once daily  Problem # 5:  HYPERTENSION (ICD-401.9) Continue present blood pressure medications. Her updated medication list for this problem includes:    Metoprolol Tartrate 100 Mg Tabs (Metoprolol tartrate) .Marland Kitchen... Take one tablet by mouth twice a day     Furosemide 80 Mg Tabs (Furosemide) .Marland Kitchen... Take one tablet by mouth daily.    Aspirin 81 Mg Tabs (Aspirin) .Marland Kitchen... 1 tab by mouth once daily    Amlodipine Besylate 5 Mg Tabs (Amlodipine besylate) .Marland Kitchen... 1 once daily  Problem # 6:  HYPERLIPIDEMIA (ICD-272.4) Continue statin. Her updated medication list for this problem includes:    Lipitor 40 Mg Tabs (Atorvastatin calcium) .Marland Kitchen... At bedtime  Problem # 7:  RENAL INSUFFICIENCY (ICD-588.9) Renal insufficiency now being monitored by primary care.  Patient Instructions: 1)  Your physician recommends that you schedule a follow-up appointment in: 8 WEEKS 2)  Your physician has recommended you make the following change in your medication: STOP AMIODARONE 3)  Your physician has requested that you limit the intake of sodium (salt) in your diet to two grams daily. Please see [facility] handout.

## 2010-02-24 NOTE — Letter (Signed)
Summary: [facility]   Imported By: Marylou Mccoy 12/15/2009 16:30:28  _____________________________________________________________________  External Attachment:    Type:   Image     Comment:   External Document

## 2010-02-24 NOTE — Letter (Signed)
Summary: [facility]  [facility]   Imported By: Kassie Mends 02/09/2009 08:37:09  _____________________________________________________________________  External Attachment:    Type:   Image     Comment:   External Document

## 2010-02-24 NOTE — Miscellaneous (Signed)
Summary: [facility]  [facility]   Imported By: Sherian Rein 06/30/2009 13:08:45  _____________________________________________________________________  External Attachment:    Type:   Image     Comment:   External Document
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
## 2010-02-24 NOTE — Letter (Signed)
Summary: [facility] - Outpatient Coins[facility] Notice  [facility]   Imported By: Marylou Mccoy 05/13/2009 11:51:57  _____________________________________________________________________  External Attachment:    Type:   Image     Comment:   External Document

## 2010-02-24 NOTE — Letter (Signed)
Summary: [facility]   Imported By: Sherian Rein 04/26/2009 12:00:08  _____________________________________________________________________  External Attachment:    Type:   Image     Comment:   External Document

## 2010-02-24 NOTE — Medication Information (Signed)
Summary: Tracey [name]  Anticoagulant Therapy  Managed by: Cloyde Reams, RN, BSN Referring MD: Jens Som MD, Arlys John PCP: Kirby Funk, MD Supervising MD: Shirlee Latch MD, Caedin Mogan Indication 1: Atrial Fibrillation (ICD-427.31) Lab Used: [facility] Site: [facility] INR POC 3.6 INR RANGE 2 - 3  Dietary changes: no    Health status changes: no    Bleeding/hemorrhagic complications: no    Recent/future hospitalizations: no    Any changes in medication regimen? no    Recent/future dental: no  Any missed doses?: no       Is patient compliant with meds? yes      Comments: Pt requests dosage be cheanged to Mondays and Fridays 2 tablets to help better remember.  Allergies: 1)  ! Demerol 2)  Codeine Phosphate (Codeine Phosphate) 3)  Morphine Sulfate (Morphine Sulfate)  Anticoagulation Management History:      The patient is taking warfarin and comes in today for a routine follow up visit.  Positive risk factors for bleeding include an age of 75 years or older and presence of serious comorbidities.  The bleeding index is 'intermediate risk'.  Positive CHADS2 values include History of HTN and Age > 13 years old.  The start date was 06/16/2008.  Her last INR was 2.4 ratio.  Anticoagulation responsible provider: Shirlee Latch MD, Herchel Hopkin.  INR POC: 3.6.  Cuvette Lot#: [id number].  Exp: 09/2010.    Anticoagulation Management Assessment/Plan:      The patient's current anticoagulation dose is Warfarin sodium 2 mg  tabs: Take as directed by coumadin clinic..  The target INR is 2.0-3.0.  The next INR is due 08/05/2009.  Anticoagulation instructions were given to patient.  Results were reviewed/authorized by Cloyde Reams, RN, BSN.  She was notified by Cloyde Reams RN.         Prior Anticoagulation Instructions: INR 3.2. Take 1 tablet today only, then take 1.5 tablets daily except 2 tablets on Tues, Thurs, Sat. Recheck in 2 weeks.  Current Anticoagulation Instructions: INR 3.6  Skip tonight's dosage of  coumadin, then decr dosage to 1.5 tablets daily except 2 tablets on Mondays and Fridays.  Recheck in 2 weeks.

## 2010-02-24 NOTE — Letter (Signed)
Summary: [facility]  [facility]   Imported By: Kassie Mends 02/09/2009 08:41:23  _____________________________________________________________________  External Attachment:    Type:   Image     Comment:   External Document

## 2010-02-24 NOTE — Progress Notes (Signed)
Summary: pacer site hurts  Phone Note Call from Patient Call back at Home Phone [phone]   Caller: Patient Reason for Call: Talk to Nurse, Talk to Doctor Summary of Call: pt is sore where her pacer is when she touches it, she has pain Initial call taken by: Omer Jack,  May 26, 2009 11:49 AM  Follow-up for Phone Call        Scheduler is to call patient and put her on the schedule for the device clinic 5-5. Follow-up by: Altha Harm, LPN,  May 27, 979 4:15 PM

## 2010-02-24 NOTE — Progress Notes (Signed)
Summary: refill  Phone Note Refill Request Message from:  Patient on February 12, 2009 2:28 PM  Refills Requested: Medication #1:  WARFARIN SODIUM 2 MG  TABS Take as directed by coumadin clinic. sent to [facility] [address] [phone]  Initial call taken by: Judie Grieve,  February 12, 2009 2:28 PM  Follow-up for Phone Call        Sent rx electronically to pharmacy.  Called spoke with pt made aware rx requested sent electronically. Follow-up by: Cloyde Reams RN,  February 12, 2009 3:04 PM    Prescriptions: WARFARIN SODIUM 2 MG  TABS (WARFARIN SODIUM) Take as directed by coumadin clinic.  #60 x 3   Entered by:   Cloyde Reams RN   Authorized by:   Ferman Hamming, MD, [facility]   Signed by:   Cloyde Reams RN on 02/12/2009   Method used:   Electronically to        [facility] (retail)       [address]       [place], [place]  [address]       Ph: [phone] or [phone]       Fax: [phone]   RxID:   [id number]

## 2010-02-24 NOTE — Medication Information (Signed)
Summary: rov/tm  Anticoagulant Therapy  Managed by: Bethena Midget, RN, BSN Referring MD: Jens Som MD, Arlys John PCP: Kirby Funk, MD Supervising MD: Ladona Ridgel MD, Sharlot Gowda Indication 1: Atrial Fibrillation (ICD-427.31) Lab Used: [facility] Site: [facility] INR POC 2.0 INR RANGE 2 - 3  Dietary changes: no    Health status changes: no    Bleeding/hemorrhagic complications: no    Recent/future hospitalizations: no    Any changes in medication regimen? yes       Details: Pt saw Nephrologist today, blood pressure med was decreased and fluid pill increased from 80mg s to 160mg s   Recent/future dental: no  Any missed doses?: no       Is patient compliant with meds? yes       Allergies: 1)  ! Demerol 2)  Codeine Phosphate (Codeine Phosphate) 3)  Morphine Sulfate (Morphine Sulfate)  Anticoagulation Management History:      The patient is taking warfarin and comes in today for a routine follow up visit.  Positive risk factors for bleeding include an age of 28 years or older and presence of serious comorbidities.  The bleeding index is 'intermediate risk'.  Positive CHADS2 values include History of HTN and Age > 71 years old.  The start date was 06/16/2008.  Her last INR was 2.4 ratio.  Anticoagulation responsible provider: Ladona Ridgel MD, Sharlot Gowda.  INR POC: 2.0.  Cuvette Lot#: [id number].  Exp: 02/2011.    Anticoagulation Management Assessment/Plan:      The patient's current anticoagulation dose is Warfarin sodium 2 mg  tabs: Take as directed by coumadin clinic..  The target INR is 2.0-3.0.  The next INR is due 02/16/2010.  Anticoagulation instructions were given to patient.  Results were reviewed/authorized by Bethena Midget, RN, BSN.  She was notified by Bethena Midget, RN, BSN.         Prior Anticoagulation Instructions: INR 2.1 Change dose to 3mg s daily except 2mg s on Mondays and Fridays. Recheck in 2 weeks.   Current Anticoagulation Instructions: INR 2.0 Change dose to  1.5 pills everyday  except 1 pill on Mondays. Recheck in 2 weeks.

## 2010-02-24 NOTE — Progress Notes (Signed)
Summary: gaining fluid/trouble breathing  Phone Note Call from Patient Call back at Home Phone [phone]   Caller: Patient Reason for Call: Talk to Nurse Summary of Call: retaining fluid, having trouble breathing, request lasix Initial call taken by: Migdalia Dk,  May 06, 2009 11:18 AM  Follow-up for Phone Call        spoke with pt, she has developed swelling in her feet and ankles. she states her weight is up 4 lbs since yesterday. she is SOB with little exerction and has trouble taking a deep breath while sitting. she took HCTZ 25mg  this am but feels she needs lasix. she feels she went back in sinus rhythm yesterday. today her pulse is regular. will foward to dr Jens Som for his review [name], RN  May 06, 2009 11:42 AM   Additional Follow-up for Phone Call Additional follow up Details #1::        dc hctz; lasix 40 mg by mouth now and then 20 mg by mouth daily; take an additional 20 mg by mouth daily as needed; bmet and f/u ov on Monday. Ferman Hamming, MD, [facility]  May 06, 2009 12:53 PM  pt aware [name], RN  May 06, 2009 2:03 PM     New/Updated Medications: FUROSEMIDE 20 MG TABS (FUROSEMIDE) take two tablets now thenTake one tablet by mouth daily. Prescriptions: FUROSEMIDE 20 MG TABS (FUROSEMIDE) take two tablets now thenTake one tablet by mouth daily.  #32 x 12   Entered by:   [name], RN   Authorized by:   Ferman Hamming, MD, [facility]   Signed by:   [name], RN on 05/06/2009   Method used:   Electronically to        [id number]       [address]       [place], [place]  [address]       Ph: [phone] or [phone]       Fax: [phone]   RxID:   [id number]

## 2010-02-24 NOTE — Medication Information (Signed)
Summary: rov/ewj  Anticoagulant Therapy  Managed by: Bethena Midget, RN, BSN Referring MD: Jens Som MD, Arlys John PCP: Kirby Funk, MD Supervising MD: Graciela Husbands MD, Viviann Spare Indication 1: Atrial Fibrillation (ICD-427.31) Lab Used: [facility] Site: [facility] INR POC 4.2 INR RANGE 2 - 3  Dietary changes: no    Health status changes: yes       Details: Feels tried and week today, she woke up feeling like this HR regular at 62  Bleeding/hemorrhagic complications: no    Recent/future hospitalizations: no    Any changes in medication regimen? no    Recent/future dental: no  Any missed doses?: no       Is patient compliant with meds? yes      Comments: B/P 144/80  Allergies: 1)  ! Demerol 2)  Codeine Phosphate (Codeine Phosphate) 3)  Morphine Sulfate (Morphine Sulfate)  Anticoagulation Management History:      The patient is taking warfarin and comes in today for a routine follow up visit.  Positive risk factors for bleeding include an age of 75 years or older and presence of serious comorbidities.  The bleeding index is 'intermediate risk'.  Positive CHADS2 values include History of HTN and Age > 75 years old.  The start date was 06/16/2008.  Her last INR was 2.4 ratio.  Anticoagulation responsible provider: Graciela Husbands MD, Viviann Spare.  INR POC: 4.2.  Cuvette Lot#: [id number].  Exp: 09/2010.    Anticoagulation Management Assessment/Plan:      The patient's current anticoagulation dose is Warfarin sodium 2 mg  tabs: Take as directed by coumadin clinic..  The target INR is 2.0-3.0.  The next INR is due 08/19/2009.  Anticoagulation instructions were given to patient.  Results were reviewed/authorized by Bethena Midget, RN, BSN.  She was notified by Bethena Midget, RN, BSN.         Prior Anticoagulation Instructions: INR 3.6  Skip tonight's dosage of coumadin, then decr dosage to 1.5 tablets daily except 2 tablets on Mondays and Fridays.  Recheck in 2 weeks.    Current Anticoagulation Instructions: INR  4.2 Skip today's dose then change dose to  1.5 tablets everyday. Recheck in 2 weeks.

## 2010-02-24 NOTE — Medication Information (Signed)
Summary: rov/jk  Anticoagulant Therapy  Managed by: Bethena Midget, RN, BSN Referring MD: Jens Som MD, Arlys John PCP: Kirby Funk, MD Supervising MD: Tenny Craw MD, Gunnar Fusi Indication 1: Atrial Fibrillation (ICD-427.31) Lab Used: [facility] Site: [facility] INR POC 3.3 INR RANGE 2 - 3  Dietary changes: yes       Details: Eating less green leafy veggies  Health status changes: no    Bleeding/hemorrhagic complications: no    Recent/future hospitalizations: no    Any changes in medication regimen? no    Recent/future dental: no  Any missed doses?: no       Is patient compliant with meds? yes       Allergies: 1)  ! Demerol 2)  Codeine Phosphate (Codeine Phosphate) 3)  Morphine Sulfate (Morphine Sulfate)  Anticoagulation Management History:      The patient is taking warfarin and comes in today for a routine follow up visit.  Positive risk factors for bleeding include an age of 50 years or older and presence of serious comorbidities.  The bleeding index is 'intermediate risk'.  Positive CHADS2 values include History of HTN and Age > 3 years old.  The start date was 06/16/2008.  Her last INR was 2.4 ratio.  Anticoagulation responsible provider: Tenny Craw MD, Gunnar Fusi.  INR POC: 3.3.  Cuvette Lot#: [id number].  Exp: 11/2010.    Anticoagulation Management Assessment/Plan:      The patient's current anticoagulation dose is Warfarin sodium 2 mg  tabs: Take as directed by coumadin clinic..  The target INR is 2.0-3.0.  The next INR is due 10/18/2009.  Anticoagulation instructions were given to patient.  Results were reviewed/authorized by Bethena Midget, RN, BSN.  She was notified by Bethena Midget, RN, BSN.         Prior Anticoagulation Instructions: INR 2.3  Continue taking 1.5 tablets (3mg ) every day except take 1 tablet (2mg ) on Mondays, Wednesdays, and Friday.  Recheck in 2 week.    Current Anticoagulation Instructions: INR 3.3 Skip today's dose then resume 3mg s everyday except 2mg s on Mondays,  Wednesdays and Fridays. Recheck in 2 weeks.

## 2010-02-24 NOTE — Letter (Signed)
Summary: [facility]  [facility]   Imported By: Marylou Mccoy 03/11/2009 15:28:15  _____________________________________________________________________  External Attachment:    Type:   Image     Comment:   External Document

## 2010-02-24 NOTE — Assessment & Plan Note (Signed)
Summary: per check out/sf   Referring Provider:  [name], MD Primary Provider:  Kirby Funk, MD  CC:  dizziness pt thinks its due to sinus and sob.  History of Present Illness: Pleasant female with a past medical history of paroxysmal atrial fibrillation, status post pacemaker placement due to tachybradycardia syndrome, coronary artery disease with prior PCI of the right coronary artery in 2007 with drug-eluting stents who I last saw in November of 2010. At that time she was having vague chest pain. A myoview was performed and showed an ejection fraction of 57%. There was possible mild anterior ischemia but felt more likely to be probable soft tissue attenuation. We are treating medically. Since I last saw her the patient denies any dyspnea on exertion, orthopnea, PND,  palpitations, syncope or chest pain. She occasionally has mild pedal edema towards the end of the day it resolves overnight.   Current Medications (verified): 1)  Valium 5 Mg  Tabs (Diazepam) .... Take 1/2 To 1 Daily As Needed 2)  Pantoprazole Sodium 40 Mg  Tbec (Pantoprazole Sodium) .... Once Daily 3)  Folic Acid-Vit B6-Vit B12 0.4-50-0.1 Mg  Tabs (Folic Acid-Vit B6-Vit B12) .... Two Times A Day 4)  Lipitor 40 Mg  Tabs (Atorvastatin Calcium) .... At Bedtime 5)  Warfarin Sodium 2 Mg  Tabs (Warfarin Sodium) .... Take As Directed By Coumadin Clinic. 6)  Plavix 75 Mg  Tabs (Clopidogrel Bisulfate) .... Once Daily 7)  Metoprolol Tartrate 50 Mg  Tabs (Metoprolol Tartrate) .Marland Kitchen.. 1 Tab By Mouth Two Times A Day 8)  Ocuvite-Lutein   Caps (Multiple Vitamins-Minerals) .... Once Daily 9)  Diltiazem Hcl Er Beads 180 Mg Xr24h-Cap (Diltiazem Hcl Er Beads) .... Take One Capsule By Mouth Daily 10)  Adavnce Formula .Marland Kitchen.. 1 Tab By Mouth Once Daily  Allergies: 1)  ! Codeine 2)  ! Demerol 3)  ! Morphine 4)  Codeine Phosphate (Codeine Phosphate) 5)  Morphine Sulfate (Morphine Sulfate)  Past History:  Past Medical History: Reviewed  history from 07/29/2008 and no changes required. CORONARY ARTERY DISEASE (ICD-414.00) HYPERTENSION (ICD-401.9) HYPERLIPIDEMIA (ICD-272.4) PAROXYSMAL ATRIAL FIBRILLATION, HX OF (ICD-V12.59) MYOCARDIAL INFARCTION, HX OF (ICD-412) ANEMIA, IRON DEFICIENCY, HX OF (ICD-V12.3) RENAL INSUFFICIENCY (ICD-588.9) GERD (ICD-530.81) PANIC DISORDER (ICD-300.01) ABDOMINAL PAIN, CHRONIC (ICD-789.00) IRRITABLE BOWEL SYNDROME (ICD-564.1) CEREBRAL ANEURYSM (ICD-437.3) DIVERTICULOSIS, COLON (ICD-562.10) ANXIETY (ICD-300.00) ARTHRITIS (ICD-716.90) FECAL INCONTINENCE (ICD-787.6) H/O goiter H/O pacemker for sick sinus syndrome/ tachycardia-bradycardia syndrome  Past Surgical History: Reviewed history from 07/29/2008 and no changes required. cholecystectomy hysterectomy surgery for subdural hematoma-Aug. 2007 Tonsillectomy Appendectomy PPM implanted by Dr Jenne Campus 1/09  Social History: Reviewed history from 07/29/2008 and no changes required. Patient is married.  Lives in [place], [place]. 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   Review of Systems       Occasional abdominal pain from irritable bowel syndrome but no fevers or chills, productive cough, hemoptysis, dysphasia, odynophagia, melena, hematochezia, dysuria, hematuria, rash, seizure activity, orthopnea, PND,  claudication. Remaining systems are negative.   Vital Signs:  Patient profile:   77 year old female Height:      149 inches Weight:      149 pounds Pulse rate:   68 / minute Resp:     12 per minute BP sitting:   159 / 76  (left arm)  Vitals Entered By: Kem Parkinson (March 03, 2009 2:50 PM)  Physical Exam  General:  Well-developed well-nourished in no acute distress.  Skin is warm and  dry.  HEENT is normal.  Neck is supple. No thyromegaly.  Chest is clear to auscultation with normal expansion.  Cardiovascular exam is regular rate and rhythm.  Abdominal exam nontender or  distended. No masses palpated. Extremities show trace edema. neuro grossly intact    PPM Specifications Following MD:  Hillis Range, MD     Referring MD:  Cecille Po Vendor:  [employer]     PPM Model Number:  [id number]     PPM Serial Number:  [id number] PPM DOI:  01/30/2007     PPM Implanting MD:  NOT IMPLANTED HERE  Lead 1    Location: [facility]     DOI: 01/30/2007     Model #: [id number]     Serial #: [id number]     Status: active Lead 2    Location: [facility]     DOI: 01/30/2007     Model #: [id number]     Serial #: [id number]     Status: active  PPM Follow Up Pacer Dependent:  No      Parameters Mode:  MVP (R)     Lower Rate Limit:  60     Upper Rate Limit:  120 Paced AV Delay:  180     Sensed AV Delay:  150 Rate Response Parameters:  Rate response-8, Threshold-Med/low, Acceleration-30 seconds, Deceleration-5 min  Impression & Recommendations:  Problem # 1:  PACEMAKER, PERMANENT (ICD-V45.01) Management per EP.  Problem # 2:  COUMADIN THERAPY (ICD-V58.61) Goal INR 2-3. Monitored in the Coumadin clinic. Check CBC.  Problem # 3:  CORONARY ARTERY DISEASE (ICD-414.00) I will discontinue Plavix and treat with aspirin 81 mg p.o. daily. Continue beta blocker and statin. The following medications were removed from the medication list:    Plavix 75 Mg Tabs (Clopidogrel bisulfate) ..... Once daily Her updated medication list for this problem includes:    Warfarin Sodium 2 Mg Tabs (Warfarin sodium) .Marland Kitchen... Take as directed by coumadin clinic.    Metoprolol Tartrate 50 Mg Tabs (Metoprolol tartrate) .Marland Kitchen... 1and 1/2  tab by mouth two times a day    Diltiazem Hcl Er Beads 240 Mg Xr24h-cap (Diltiazem hcl er beads) .Marland Kitchen... Take one capsule by mouth daily    Aspirin 81 Mg Tbec (Aspirin) .Marland Kitchen... Take one tablet by mouth daily  Problem # 4:  HYPERTENSION (ICD-401.9) Blood pressure elevated. Increase Lopressor to 75 mg p.o. b.i.d. Her updated medication list for this problem includes:    Metoprolol Tartrate 50 Mg Tabs  (Metoprolol tartrate) .Marland Kitchen... 1and 1/2  tab by mouth two times a day    Diltiazem Hcl Er Beads 240 Mg Xr24h-cap (Diltiazem hcl er beads) .Marland Kitchen... Take one capsule by mouth daily    Aspirin 81 Mg Tbec (Aspirin) .Marland Kitchen... Take one tablet by mouth daily  Problem # 5:  HYPERLIPIDEMIA (ICD-272.4) Continue statin. Check lipids and liver. Her updated medication list for this problem includes:    Lipitor 40 Mg Tabs (Atorvastatin calcium) .Marland Kitchen... At bedtime  Problem # 6:  ATRIAL FIBRILLATION, HX OF (ICD-V12.59) Continue beta blocker and calcium blocker. Continue Coumadin with goal INR 2-3. The following medications were removed from the medication list:    Plavix 75 Mg Tabs (Clopidogrel bisulfate) ..... Once daily Her updated medication list for this problem includes:    Warfarin Sodium 2 Mg Tabs (Warfarin sodium) .Marland Kitchen... Take as directed by coumadin clinic.    Metoprolol Tartrate 50 Mg Tabs (Metoprolol tartrate) .Marland Kitchen... 1and 1/2  tab by mouth two times a day    Diltiazem  Hcl Er Beads 240 Mg Xr24h-cap (Diltiazem hcl er beads) .Marland Kitchen... Take one capsule by mouth daily    Aspirin 81 Mg Tbec (Aspirin) .Marland Kitchen... Take one tablet by mouth daily  Problem # 7:  IRRITABLE BOWEL SYNDROME (ICD-564.1)  Problem # 8:  RENAL INSUFFICIENCY (ICD-588.9)  Patient Instructions: 1)  Your physician recommends that you schedule a follow-up appointment in: 6 MONTHS 2)  Your physician recommends that you return for lab work ZO:XWRU COUMADIN APPT 3)  Your physician has recommended you make the following change in your medication: STOP PLAVIX 4)  START ASPIRIN 81MG  ONCE DAILY 5)  INCREASE METOPROLOL TART 50MG  1 AND 1/2 TABLET TWICE DAILY Prescriptions: METOPROLOL TARTRATE 50 MG  TABS (METOPROLOL TARTRATE) 1AND 1/2  tab by mouth two times a day  #75 x 12   Entered by:   [name], RN   Authorized by:   Ferman Hamming, MD, [facility]   Signed by:   [name], RN on 03/03/2009   Method used:   Electronically to        [facility] (retail)       [address]       [place], [place]  [address]       Ph: [phone] or [phone]       Fax: [phone]   RxID:   [id number]

## 2010-02-24 NOTE — Progress Notes (Signed)
Summary: Calling regarding new medication-  Phone Note Call from Patient Call back at [facility] Phone [phone]   Caller: Patient Summary of Call: Pt calling regarding the new medication that Dr.Stephan Nelis gave pt yesterday, the pt is scared to take the new medication. Initial call taken by: Judie Grieve,  Jun 04, 2009 8:29 AM  Follow-up for Phone Call        06/04/09--0930--pt calling stating she's afraid to start amiordorone after reading insert for med--advised to go ahead and take 1 dose of 200mg  and i will call her back at 11:30 am to see if she's OK--pt  agrees and states she just needs to know amiordorone won't kill her--iressured many patients on this med and dr Tammara Massing wouldn't prescribe if he thought med would harm her--pt states she understands and feels better about taking med--nt 1130am--06/04/09--called pt who states she has taken amiordarone and is doing fine Follow-up by: Ledon Snare, RN,  Jun 04, 2009 11:47 AM     Appended Document: Calling regarding new medication- thanks continue amiodarone

## 2010-02-24 NOTE — Medication Information (Signed)
Summary: rov/sp  Anticoagulant Therapy  Managed by: Bethena Midget, RN, BSN Referring MD: Jens Som MD, Arlys John PCP: Kirby Funk, MD Supervising MD: Jens Som MD, Arlys John Indication 1: Atrial Fibrillation (ICD-427.31) Lab Used: [facility] Site: [facility] INR POC 2.1 INR RANGE 2 - 3  Dietary changes: no    Health status changes: no    Bleeding/hemorrhagic complications: no    Recent/future hospitalizations: no    Any changes in medication regimen? yes       Details: Amiodarone stopped today   Recent/future dental: no  Any missed doses?: no       Is patient compliant with meds? yes      Comments: Saw Dr Jens Som today  Allergies: 1)  ! Demerol 2)  Codeine Phosphate (Codeine Phosphate) 3)  Morphine Sulfate (Morphine Sulfate)  Anticoagulation Management History:      The patient is taking warfarin and comes in today for a routine follow up visit.  Positive risk factors for bleeding include an age of 75 years or older and presence of serious comorbidities.  The bleeding index is 'intermediate risk'.  Positive CHADS2 values include History of HTN and Age > 43 years old.  The start date was 06/16/2008.  Her last INR was 2.4 ratio.  Anticoagulation responsible [place]: Jens Som MD, Arlys John.  INR POC: 2.1.  Cuvette Lot#: [id number].  Exp: 10/2011.    Anticoagulation Management Assessment/Plan:      The patient's current anticoagulation dose is Warfarin sodium 2 mg  tabs: Take as directed by coumadin clinic..  The target INR is 2.0-3.0.  The next INR is due 02/03/2010.  Anticoagulation instructions were given to patient.  Results were reviewed/authorized by Bethena Midget, RN, BSN.  She was notified by Bethena Midget, RN, BSN.         Prior Anticoagulation Instructions: INR 2.7  Continue same dose of 1 1/2 tablets every day except 1 tablet on Monday, Wednesday and Friday.  Recheck INR in 4 weeks.   Current Anticoagulation Instructions: INR 2.1 Change dose to 3mg s daily except 2mg s on  Mondays and Fridays. Recheck in 2 weeks.

## 2010-02-24 NOTE — Assessment & Plan Note (Signed)
Summary: PER CHECK OUT   Referring Amine Adelson:  [name], MD Primary Tomorrow Dehaas:  Kirby Funk, MD   History of Present Illness: The patient presents today for routine electrophysiology followup. She reports doing very well since last being seen in our clinic. The patient denies symptoms of palpitations, chest pain, shortness of breath, orthopnea, PND, lower extremity edema, dizziness, presyncope, syncope, or neurologic sequela.  She reports occasional fatigue when walking her dog.  She had brief chest tightness 2 weeks ago which she attributes to "indigestion".  No further episodes.  The patient is tolerating medications without difficulties and is otherwise without complaint today.   Current Medications (verified): 1)  Valium 5 Mg  Tabs (Diazepam) .... Take 1/2 To 1 Daily As Needed 2)  Pantoprazole Sodium 40 Mg  Tbec (Pantoprazole Sodium) .... Once Daily 3)  Folic Acid 1 Mg Tabs (Folic Acid) .... Take Two Times A Day 4)  Lipitor 40 Mg  Tabs (Atorvastatin Calcium) .... At Bedtime 5)  Warfarin Sodium 2 Mg  Tabs (Warfarin Sodium) .... Take As Directed By Coumadin Clinic. 6)  Metoprolol Tartrate 100 Mg Tabs (Metoprolol Tartrate) .... Take One Tablet By Mouth Twice A Day 7)  Ocuvite-Lutein   Caps (Multiple Vitamins-Minerals) .... Once Daily 8)  Fluticasone Propionate 50 Mcg/act Susp (Fluticasone Propionate) .Marland Kitchen.. 1-2 Sprays Each Nostril Once Daily 9)  Furosemide 40 Mg Tabs (Furosemide) .... Take One Tablet By Mouth Daily. 10)  Whole Mega- Fish Oil .Marland Kitchen.. 1 Tab By Mouth Two Times A Day 11)  Aspirin 81 Mg  Tabs (Aspirin) .Marland Kitchen.. 1 Tab By Mouth Once Daily 12)  Potassium Chloride Crys Cr 20 Meq Cr-Tabs (Potassium Chloride Crys Cr) .... Take One Tablet By Mouth Daily 13)  Amiodarone Hcl 200 Mg Tabs (Amiodarone Hcl) .... Take One Tablet By Mouth Once Daily 14)  Climara 0.05 Mg/24hr Ptwk (Estradiol) .... Uad 15)  Multivitamins   Tabs (Multiple Vitamin) .Marland Kitchen.. 1 By Mouth Daily  Allergies (verified): 1)  !  Demerol 2)  Codeine Phosphate (Codeine Phosphate) 3)  Morphine Sulfate (Morphine Sulfate)  Past History:  Past Medical History: Reviewed history from 05/10/2009 and no changes required. CORONARY ARTERY DISEASE (ICD-414.00) HYPERTENSION (ICD-401.9) HYPERLIPIDEMIA (ICD-272.4) PAROXYSMAL ATRIAL FIBRILLATION, HX OF (ICD-V12.59) MYOCARDIAL INFARCTION, HX OF (ICD-412) ANEMIA, IRON DEFICIENCY, HX OF (ICD-V12.3) RENAL INSUFFICIENCY (ICD-588.9) GERD (ICD-530.81) PANIC DISORDER (ICD-300.01) ABDOMINAL PAIN, CHRONIC (ICD-789.00) IRRITABLE BOWEL SYNDROME (ICD-564.1) CEREBRAL ANEURYSM (ICD-437.3) DIVERTICULOSIS, COLON (ICD-562.10) ARTHRITIS (ICD-716.90) FECAL INCONTINENCE (ICD-787.6) H/O goiter H/O pacemker for sick sinus syndrome/ tachycardia-bradycardia syndrome Aortic insufficiency  Past Surgical History: Reviewed history from 07/29/2008 and no changes required. cholecystectomy hysterectomy surgery for subdural hematoma-Aug. 2007 Tonsillectomy Appendectomy PPM implanted by Dr Jenne Campus 1/09  Social History: Reviewed history from 05/10/2009 and no changes required. Patient is widowed.  Lives in [place], [place]. 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   Review of Systems       All systems are reviewed and negative except as listed in the HPI.   Vital Signs:  Patient profile:   77 year old female Height:      64 inches Weight:      151 pounds BMI:     26.01 Pulse rate:   62 / minute Resp:     16 per minute BP sitting:   144 / 88  (left arm)  Vitals Entered By: Marrion Coy, CNA (September 20, 2009 3:18 PM)  Physical Exam  General:  Well-developed well-nourished in no acute distress.  Skin is warm and dry.  HEENT is normal.  Neck is supple. No thyromegaly.  Chest is clear to auscultation with normal expansion.  Cardiovascular exam is regular rate and rhythm.  Abdominal exam nontender or distended. No masses palpated. Extremities  show no edema. neuro grossly intact Pacemaker site is well healed.  Mild ecchymosis under pacmaker site s/p recent mamogram   EKG  Procedure date:  09/20/2009  Findings:      A paced at 60 bpm, PR 206, RBBB/ LAD, Qtc 523  PPM Specifications Following MD:  Hillis Range, MD     Referring MD:  Cecille Po Vendor:  [employer]     PPM Model Number:  [id number]     PPM Serial Number:  [id number] PPM DOI:  01/30/2007     PPM Implanting MD:  NOT IMPLANTED HERE  Lead 1    Location: [facility]     DOI: 01/30/2007     Model #: [id number]     Serial #: [id number]     Status: active Lead 2    Location: [facility]     DOI: 01/30/2007     Model #: [id number]     Serial #: [id number]     Status: active  Magnet Response Rate:  BOL85 ERI  65    PPM Follow Up Remote Check?  No Battery Voltage:  3.0 V     Pacer Dependent:  No       PPM Device Measurements Atrium  Amplitude: 2.6 mV, Impedance: 496 ohms, Threshold: 0.5 V at 0.4 msec Right Ventricle  Amplitude: 16.9 mV, Impedance: 376 ohms, Threshold: 1.5 V at 0.4 msec  Episodes MS Episodes:  0     Percent Mode Switch:  0     Coumadin:  Yes Ventricular High Rate:  0     Atrial Pacing:  100%     Ventricular Pacing:  0.2%  Parameters Mode:  MVP (R)     Lower Rate Limit:  60     Upper Rate Limit:  120 Paced AV Delay:  180     Sensed AV Delay:  150 Rate Response Parameters:  Rate response-8, Threshold-Med/low, Acceleration-30 seconds, Deceleration-5 min Next Cardiology Appt Due:  02/23/2010 Tech Comments:  No parameter changes.  Device function normal.  No Carelink @ this time.  ROV 6 months with Dr. Johney Frame. Altha Harm, LPN  September 20, 2009 4:05 PM  MD Comments:  agree  Impression & Recommendations:  Problem # 1:  ATRIAL FIBRILLATION (ICD-427.31) well controlled with amiodarone given prolonged QT and good afib control, we will decrease amiodarone to 100mg  daily today. We will check TFTs and LFTs today As she is chronically anticoagulated, we will check CBC  today  Problem # 2:  ENCOUNTER FOR LONG-TERM USE OF OTHER MEDICATIONS (ICD-V58.69) therapeutic INR check CBC today  Problem # 3:  PACEMAKER, PERMANENT (ICD-V45.01) normal pacemaker function for tachy/brady syndrome no changes  Problem # 4:  HYPERTENSION (ICD-401.9) stable check BMET today  Other Orders: TLB-BMP (Basic Metabolic Panel-BMET) (80048-METABOL) TLB-CBC Platelet - w/Differential (85025-CBCD) TLB-Hepatic/Liver Function Pnl (80076-HEPATIC) TLB-TSH (Thyroid Stimulating Hormone) (84443-TSH) TLB-T4 (Thyrox), Free [phone])  Patient Instructions: 1)  Decrease amiodarone to 200mg  1/2 tablet once daily. 2)  Your physician recommends that you have lab work today: bmet/cbc/tsh/freeT4/liver [phone]) 3)  Your physician wants you to follow-up in: 6 months.  You will receive a reminder letter in the mail two months in advance. If you don't receive a letter, please call our office to schedule the follow-up  appointment.

## 2010-02-24 NOTE — Medication Information (Signed)
Summary: rov/sp  Anticoagulant Therapy  Managed by: Weston Brass, PharmD Referring MD: Jens Som MD, Arlys John PCP: Kirby Funk, MD Supervising MD: Gala Romney MD, Reuel Boom Indication 1: Atrial Fibrillation (ICD-427.31) Lab Used: [facility] Site: [facility] INR POC 3.3 INR RANGE 2 - 3  Dietary changes: no    Health status changes: yes       Details: still having some dyspnea on exertion; saw primary care MD this week who said she was fine likely due to chronic cardiology issues.  Dr. Jens Som agreed likely related to dystolic dysfunction.  No swelling, weight changes noted  Bleeding/hemorrhagic complications: no    Recent/future hospitalizations: no    Any changes in medication regimen? no    Recent/future dental: no  Any missed doses?: no       Is patient compliant with meds? yes       Allergies: 1)  ! Demerol 2)  Codeine Phosphate (Codeine Phosphate) 3)  Morphine Sulfate (Morphine Sulfate)  Anticoagulation Management History:      The patient is taking warfarin and comes in today for a routine follow up visit.  Positive risk factors for bleeding include an age of 72 years or older and presence of serious comorbidities.  The bleeding index is 'intermediate risk'.  Positive CHADS2 values include History of HTN and Age > 13 years old.  The start date was 06/16/2008.  Her last INR was 2.4 ratio.  Anticoagulation responsible provider: Darcia Lampi MD, Reuel Boom.  INR POC: 3.3.  Exp: 09/2010.    Anticoagulation Management Assessment/Plan:      The patient's current anticoagulation dose is Warfarin sodium 2 mg  tabs: Take as directed by coumadin clinic..  The target INR is 2.0-3.0.  The next INR is due 09/02/2009.  Anticoagulation instructions were given to patient.  Results were reviewed/authorized by Weston Brass, PharmD.  She was notified by Weston Brass PharmD.         Prior Anticoagulation Instructions: INR 4.2 Skip today's dose then change dose to  1.5 tablets everyday. Recheck in 2 weeks.    Current Anticoagulation Instructions: INR 3.3  Take 1/2 tablet today then decrease dose to 1 1/2 tablets every day except 1 tablet on Monday.  Recheck INR in 2 weeks.

## 2010-02-24 NOTE — Letter (Signed)
Summary: [facility], Main Office  [address]   [place], [place] [address]   Phone: [phone]  Fax: [phone]     March 17, 2009 MRN: [id number]   [facility] [address] [place], [place]  [address]   Dear Ms. [name],  We have reviewed your cholesterol results.  They are as follows:     Total Cholesterol:    127 (Desirable: less than 200)       HDL  Cholesterol:     37.00  (Desirable: greater than 40 for men and 50 for women)       LDL Cholesterol:  36.5  (Desirable: less than 100 for low risk and less than 70 for moderate to high risk)       Triglycerides:       415.0  (Desirable: less than 150)  Our recommendations include:These numbers look good. Continue on the same medicine. Blood count and Liver function are normal. Take care, Dr. Darel Hong.    Call our office at the number listed above if you have any questions.  Lowering your LDL cholesterol is important, but it is only one of a large number of "risk factors" that may indicate that you are at risk for heart disease, stroke or other complications of hardening of the arteries.  Other risk factors include:   A.  Cigarette Smoking* B.  High Blood Pressure* C.  Obesity* D.   Low HDL Cholesterol (see yours above)* E.   Diabetes Mellitus (higher risk if your is uncontrolled) F.  Family history of premature heart disease G.  Previous history of stroke or cardiovascular disease    *These are risk factors YOU HAVE CONTROL OVER.  For more information, visit .  There is now evidence that lowering the TOTAL CHOLESTEROL AND LDL CHOLESTEROL can reduce the risk of heart disease.  The American Heart Association recommends the following guidelines for the treatment of elevated cholesterol:  1.  If there is now current heart disease and less than two risk factors, TOTAL CHOLESTEROL should be less than 200 and LDL CHOLESTEROL should be less than 100. 2.  If there is current  heart disease or two or more risk factors, TOTAL CHOLESTEROL should be less than 200 and LDL CHOLESTEROL should be less than 70.  A diet low in cholesterol, saturated fat, and calories is the cornerstone of treatment for elevated cholesterol.  Cessation of smoking and exercise are also important in the management of elevated cholesterol and preventing vascular disease.  Studies have shown that 30 to 60 minutes of physical activity most days can help lower blood pressure, lower cholesterol, and keep your weight at a healthy level.  Drug therapy is used when cholesterol levels do not respond to therapeutic lifestyle changes (smoking cessation, diet, and exercise) and remains unacceptably high.  If medication is started, it is important to have you levels checked periodically to evaluate the need for further treatment options.  Thank you,    [employer] Team

## 2010-02-24 NOTE — Medication Information (Signed)
Summary: rov/sp  Anticoagulant Therapy  Managed by: Bethena Midget, RN, BSN Referring MD: Jens Som MD, Arlys John PCP: Kirby Funk, MD Supervising MD: Excell Seltzer MD, Casimiro Needle Indication 1: Atrial Fibrillation (ICD-427.31) Lab Used: [facility] Site: [facility] INR POC 4.6 INR RANGE 2 - 3  Dietary changes: no    Health status changes: yes       Details: Toothache  Bleeding/hemorrhagic complications: no    Recent/future hospitalizations: no    Any changes in medication regimen? yes       Details: Amoxicillin taken for 7 days per dentist  completed last dose on 08/31/09  Recent/future dental: no  Any missed doses?: no       Is patient compliant with meds? yes       Allergies: 1)  ! Demerol 2)  Codeine Phosphate (Codeine Phosphate) 3)  Morphine Sulfate (Morphine Sulfate)  Anticoagulation Management History:      The patient is taking warfarin and comes in today for a routine follow up visit.  Positive risk factors for bleeding include an age of 75 years or older and presence of serious comorbidities.  The bleeding index is 'intermediate risk'.  Positive CHADS2 values include History of HTN and Age > 75 years old.  The start date was 06/16/2008.  Her last INR was 2.4 ratio.  Anticoagulation responsible Tracey [name]: Excell Seltzer MD, Casimiro Needle.  INR POC: 4.6.  Cuvette Lot#: [id number].  Exp: 10/2010.    Anticoagulation Management Assessment/Plan:      The patient's current anticoagulation dose is Warfarin sodium 2 mg  tabs: Take as directed by coumadin clinic..  The target INR is 2.0-3.0.  The next INR is due 09/20/2009.  Anticoagulation instructions were given to patient.  Results were reviewed/authorized by Bethena Midget, RN, BSN.  She was notified by Bethena Midget, RN, BSN.         Prior Anticoagulation Instructions: INR 3.3  Take 1/2 tablet today then decrease dose to 1 1/2 tablets every day except 1 tablet on Monday.  Recheck INR in 2 weeks.   Current Anticoagulation Instructions: INR 4.6 Skip  today, on Friday take 1/2 pill  then change dose to 1.5 pill everyday except 1 pill on Mondays, Wednesdays and Fridays. Recheck in 2 weeks.

## 2010-02-25 ENCOUNTER — Ambulatory Visit: Admit: 2010-02-25 | Payer: Self-pay | Admitting: Internal Medicine

## 2010-02-25 ENCOUNTER — Ambulatory Visit: Payer: Self-pay | Admitting: Internal Medicine

## 2010-02-28 ENCOUNTER — Telehealth: Payer: Self-pay | Admitting: Internal Medicine

## 2010-03-07 ENCOUNTER — Encounter: Payer: Self-pay | Admitting: Internal Medicine

## 2010-03-07 ENCOUNTER — Telehealth: Payer: Self-pay | Admitting: Cardiology

## 2010-03-07 ENCOUNTER — Encounter (INDEPENDENT_AMBULATORY_CARE_PROVIDER_SITE_OTHER): Payer: Medicare Other | Admitting: Internal Medicine

## 2010-03-07 DIAGNOSIS — I495 Sick sinus syndrome: Secondary | ICD-10-CM

## 2010-03-07 DIAGNOSIS — I4891 Unspecified atrial fibrillation: Secondary | ICD-10-CM

## 2010-03-07 DIAGNOSIS — I10 Essential (primary) hypertension: Secondary | ICD-10-CM

## 2010-03-08 ENCOUNTER — Encounter: Payer: Self-pay | Admitting: Cardiology

## 2010-03-10 NOTE — Letter (Signed)
Summary: [facility]   Imported By: Marylou Mccoy 03/03/2010 14:36:39  _____________________________________________________________________  External Attachment:    Type:   Image     Comment:   External Document

## 2010-03-10 NOTE — Progress Notes (Signed)
Summary: nos appt  Phone Note Call from Patient   Caller: [email or web address]  Call For: young Summary of Call: In ref to nos from 2/3 pt states she will call to rsc. Initial call taken by: Darletta Moll,  February 28, 2010 10:09 AM

## 2010-03-14 ENCOUNTER — Telehealth: Payer: Self-pay | Admitting: Cardiology

## 2010-03-15 DIAGNOSIS — I4891 Unspecified atrial fibrillation: Secondary | ICD-10-CM

## 2010-03-16 NOTE — Assessment & Plan Note (Signed)
Summary: f31m/per ck out 09/20/09/hm/kl   Visit Type:  Follow-up Referring Provider:  [name], MD Primary Provider:  Kirby Funk, MD   History of Present Illness: The patient presents today for routine electrophysiology followup. She reports doing very well since last being seen in our clinic. Her amiodarone was stopped by Dr Jens Som due to palptaitons.  Since that time, she reports rare palpitations but feels that her afib is mostly controlled.  The patient denies symptoms of chest pain, shortness of breath, orthopnea, PND, lower extremity edema, dizziness, presyncope, syncope, or neurologic sequela. The patient is tolerating medications without difficulties and is otherwise without complaint today.   Problems Prior to Update: 1)  Encounter For Long-term Use of Other Medications  (ICD-V58.69) 2)  Asthma  (ICD-493.90) 3)  Edema  (ICD-782.3) 4)  Dyspnea  (ICD-786.05) 5)  ? of Allergy, Food  (ICD-693.1) 6)  Hemoptysis Unspecified  (ICD-786.30) 7)  Rhinitis  (ICD-472.0) 8)  Encounter For Long-term Use of Other Medications  (ICD-V58.69) 9)  Atrial Fibrillation  (ICD-427.31) 10)  Chest Pain  (ICD-786.50) 11)  Pacemaker, Permanent  (ICD-V45.01) 12)  Coumadin Therapy  (ICD-V58.61) 13)  Coronary Artery Disease  (ICD-414.00) 14)  Hypertension  (ICD-401.9) 15)  Hyperlipidemia  (ICD-272.4) 16)  Atrial Fibrillation, Hx of  (ICD-V12.59) 17)  Myocardial Infarction, Hx of  (ICD-412) 18)  Anemia, Iron Deficiency, Hx of  (ICD-V12.3) 19)  Renal Insufficiency  (ICD-588.9) 20)  Gerd  (ICD-530.81) 21)  Panic Disorder  (ICD-300.01) 22)  Abdominal Pain, Chronic  (ICD-789.00) 23)  Irritable Bowel Syndrome  (ICD-564.1) 24)  Cerebral Aneurysm  (ICD-437.3) 25)  Diverticulosis, Colon  (ICD-562.10) 26)  Anxiety  (ICD-300.00) 27)  Arthritis  (ICD-716.90) 28)  Abnormal Heart Rhythms  (ICD-427.9) 29)  Fecal Incontinence  (ICD-787.6)  Current Medications (verified): 1)  Valium 5 Mg  Tabs (Diazepam)  .... Take 1/2 To 1 Daily As Needed 2)  Pantoprazole Sodium 40 Mg  Tbec (Pantoprazole Sodium) .... Once Daily 3)  Folic Acid 1 Mg Tabs (Folic Acid) .... Take Two Times A Day 4)  Lipitor 40 Mg  Tabs (Atorvastatin Calcium) .... At Bedtime 5)  Warfarin Sodium 2 Mg  Tabs (Warfarin Sodium) .... Take As Directed By Coumadin Clinic. 6)  Metoprolol Tartrate 100 Mg Tabs (Metoprolol Tartrate) .... Take One Tablet By Mouth Twice A Day 7)  Ocuvite-Lutein   Caps (Multiple Vitamins-Minerals) .... Once Daily 8)  Fluticasone Propionate 50 Mcg/act Susp (Fluticasone Propionate) .Marland Kitchen.. 1-2 Sprays Each Nostril Once Daily 9)  Furosemide 80 Mg Tabs (Furosemide) .... Take One Tablet By Mouth Daily. 10)  Whole Mega- Fish Oil .Marland Kitchen.. 1 Tab By Mouth Two Times A Day 11)  Aspirin 81 Mg  Tabs (Aspirin) .Marland Kitchen.. 1 Tab By Mouth Once Daily 12)  Klor-Con 10 10 Meq Cr-Tabs (Potassium Chloride) .Marland Kitchen.. 1 Once Daily 13)  Climara 0.05 Mg/24hr Ptwk (Estradiol) .... Uad 14)  Multivitamins   Tabs (Multiple Vitamin) .Marland Kitchen.. 1 By Mouth Daily 15)  Amlodipine Besylate 5 Mg Tabs (Amlodipine Besylate) .Marland Kitchen.. 1 Once Daily 16)  Vitamin D 1000 Unit Tabs (Cholecalciferol) .... Take 1 Tablet By Mouth Once A Day  Allergies: 1)  ! Demerol 2)  Codeine Phosphate (Codeine Phosphate) 3)  Morphine Sulfate (Morphine Sulfate)  Past History:  Past Medical History: Reviewed history from 05/10/2009 and no changes required. CORONARY ARTERY DISEASE (ICD-414.00) HYPERTENSION (ICD-401.9) HYPERLIPIDEMIA (ICD-272.4) PAROXYSMAL ATRIAL FIBRILLATION, HX OF (ICD-V12.59) MYOCARDIAL INFARCTION, HX OF (ICD-412) ANEMIA, IRON DEFICIENCY, HX OF (ICD-V12.3) RENAL INSUFFICIENCY (ICD-588.9) GERD (ICD-530.81) PANIC  DISORDER (ICD-300.01) ABDOMINAL PAIN, CHRONIC (ICD-789.00) IRRITABLE BOWEL SYNDROME (ICD-564.1) CEREBRAL ANEURYSM (ICD-437.3) DIVERTICULOSIS, COLON (ICD-562.10) ARTHRITIS (ICD-716.90) FECAL INCONTINENCE (ICD-787.6) H/O goiter H/O pacemker for sick sinus syndrome/  tachycardia-bradycardia syndrome Aortic insufficiency  Past Surgical History: Reviewed history from 07/29/2008 and no changes required. cholecystectomy hysterectomy surgery for subdural hematoma-Aug. 2007 Tonsillectomy Appendectomy PPM implanted by Dr Jenne Campus 1/09  Family History: Reviewed history from 06/16/2008 and no changes required. Family History of Ovarian Cancer: Family History of Uterine Cancer: Family History of Colon Polyps: Family History of Diabetes:  4 siblings with heart disease:   Social History: Reviewed history from 05/10/2009 and no changes required. Patient is widowed.  Lives in [place], [place]. 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   Review of Systems       All systems are reviewed and negative except as listed in the HPI.   Vital Signs:  Patient profile:   77 year old female Height:      64 inches Weight:      153.25 pounds Pulse rate:   69 / minute Pulse rhythm:   regular Resp:     18 per minute BP sitting:   138 / 77  (left arm) Cuff size:   large  Vitals Entered By: Vikki Ports (March 07, 2010 3:02 PM)  Physical Exam  General:  Well developed, well nourished, in no acute distress. Head:  normocephalic and atraumatic Eyes:  PERRLA/EOM intact; conjunctiva and lids normal. Mouth:  Teeth, gums and palate normal. Oral mucosa normal. Neck:  supple Lungs:  Clear bilaterally to auscultation and percussion. Heart:  RRR, no m/r/g Abdomen:  Bowel sounds positive; abdomen soft and non-tender without masses, organomegaly, or hernias noted. No hepatosplenomegaly. Msk:  Back normal, normal gait. Muscle strength and tone normal. Extremities:  No clubbing or cyanosis. Neurologic:  Alert and oriented x 3. Skin:  Intact without lesions or rashes. Psych:  Normal affect.   PPM Specifications Following MD:  Hillis Range, MD     Referring MD:  Cecille Po Vendor:  [employer]     PPM Model Number:  [id number]      PPM Serial Number:  [id number] PPM DOI:  01/30/2007     PPM Implanting MD:  NOT IMPLANTED HERE  Lead 1    Location: RA     DOI: 01/30/2007     Model #: [id number]     Serial #: [id number]     Status: active Lead 2    Location: RV     DOI: 01/30/2007     Model #: [id number]     Serial #: [id number]     Status: active  Magnet Response Rate:  BOL85 ERI  65    PPM Follow Up Pacer Dependent:  No      Episodes Coumadin:  Yes  Parameters Mode:  MVP (R)     Lower Rate Limit:  60     Upper Rate Limit:  120 Paced AV Delay:  180     Sensed AV Delay:  150 Rate Response Parameters:  Rate response-8, Threshold-Med/low, Acceleration-30 seconds, Deceleration-5 min MD Comments:  see scanned report  Impression & Recommendations:  Problem # 1:  ATRIAL FIBRILLATION (ICD-427.31) stable off amiodaorne continue coumadin longterm no changes today  Problem # 2:  PACEMAKER, PERMANENT (ICD-V45.01) s/p PPM for tachy/brady syndrome normal pacemaker function her V threshold is elevated, but she does not V pace.  We will therefore observe at this time.  Problem # 3:  HYPERTENSION (ICD-401.9) stable no changes today  Patient Instructions: 1)  Your physician recommends that you schedule a follow-up appointment in: 6 months with pacer clinic

## 2010-03-16 NOTE — Progress Notes (Signed)
Summary: needs clarification on dose  Phone Note Refill Request Message from:  [facility] [address]...[phone]  Refills Requested: Medication #1:  KLOR-CON 10 10 MEQ CR-TABS 1 once daily [facility] [address] needs clarification on dose  Initial call taken by: Omer Jack,  March 07, 2010 2:19 PM    Prescriptions: KLOR-CON 10 10 MEQ CR-TABS (POTASSIUM CHLORIDE) 1 once daily  #30 x 11   Entered by:   Kem Parkinson   Authorized by:   Ferman Hamming, MD, [facility]   Signed by:   Kem Parkinson on 03/07/2010   Method used:   Electronically to        [facility] (retail)       [address]       [place], [place]  [address]       Ph: [phone] or [phone]       Fax: [phone]   RxID:   [id number] KLOR-CON 10 10 MEQ CR-TABS (POTASSIUM CHLORIDE) 1 once daily  #30 x 11   Entered by:   Kem Parkinson   Authorized by:   Ferman Hamming, MD, [facility]   Signed by:   Kem Parkinson on 03/07/2010   Method used:   Electronically to        [facility]* (retail)       [address]       [place], [place]  [address]       Ph: [phone]       Fax: [phone]   RxID:   [id number]

## 2010-03-17 ENCOUNTER — Encounter: Payer: Self-pay | Admitting: Cardiology

## 2010-03-17 ENCOUNTER — Encounter: Payer: Self-pay | Admitting: Internal Medicine

## 2010-03-17 ENCOUNTER — Ambulatory Visit (INDEPENDENT_AMBULATORY_CARE_PROVIDER_SITE_OTHER): Payer: Medicare Other | Admitting: Cardiology

## 2010-03-17 ENCOUNTER — Encounter (INDEPENDENT_AMBULATORY_CARE_PROVIDER_SITE_OTHER): Payer: Medicare Other

## 2010-03-17 ENCOUNTER — Other Ambulatory Visit: Payer: Self-pay | Admitting: Cardiology

## 2010-03-17 DIAGNOSIS — Z7901 Long term (current) use of anticoagulants: Secondary | ICD-10-CM

## 2010-03-17 DIAGNOSIS — E785 Hyperlipidemia, unspecified: Secondary | ICD-10-CM

## 2010-03-17 DIAGNOSIS — I2510 Atherosclerotic heart disease of native coronary artery without angina pectoris: Secondary | ICD-10-CM

## 2010-03-17 DIAGNOSIS — I4891 Unspecified atrial fibrillation: Secondary | ICD-10-CM

## 2010-03-17 LAB — LIPID PANEL
HDL: 31.5 mg/dL — ABNORMAL LOW (ref >39.00)
LDL Cholesterol: 48 mg/dL (ref 0–99)
VLDL: 37.8 mg/dL (ref 0.0–40.0)

## 2010-03-17 LAB — HEPATIC FUNCTION PANEL
ALT: 19 U/L (ref 0–35)
Bilirubin, Direct: 0.1 mg/dL (ref 0.0–0.3)
Total Bilirubin: 0.7 mg/dL (ref 0.3–1.2)

## 2010-03-17 LAB — CONVERTED CEMR LAB: POC INR: 1.4

## 2010-03-22 NOTE — Cardiovascular Report (Signed)
Summary: Office Visit   Office Visit   Imported By: Roderic Ovens 03/18/2010 15:06:46  _____________________________________________________________________  External Attachment:    Type:   Image     Comment:   External Document

## 2010-03-22 NOTE — Assessment & Plan Note (Signed)
Summary: [employer] per pt call=mj   Referring Provider:  [name], MD Primary Provider:  Kirby Funk, MD  CC:  palpitations..pt also complains of blurred vision.  History of Present Illness: Pleasant female with a past medical history of paroxysmal atrial fibrillation, status post pacemaker placement due to tachybradycardia syndrome, coronary artery disease with prior PCI of the right coronary artery in 2007 with drug-eluting stents.  A myoview in November of 2010 showed an ejection fraction of 57%. There was possible mild anterior ischemia but felt more likely to be probable soft tissue attenuation. We are treating medically. Echocardiogram performed in April of 2011 showed normal LV function, elevated left ventricular filling pressure , mild to moderate aortic insufficiency, mild mitral regurgitation and mild biatrial enlargement. I last saw her in Dec of 2011. We discontinued her amiodarone at that time secondary to a tremor. She has also been seen by nephrology for her renal insufficiency. Since I last saw her, the patient denies any dyspnea on exertion, orthopnea, PND, syncope or chest pain. Has noticed mild increase in palpitations described as a flop. No sustained palpitations. Occasional mild pedal edema.   Current Medications (verified): 1)  Valium 5 Mg  Tabs (Diazepam) .... Take 1/2 To 1 Daily As Needed 2)  Pantoprazole Sodium 40 Mg  Tbec (Pantoprazole Sodium) .... Once Daily 3)  Folic Acid 1 Mg Tabs (Folic Acid) .... Take Two Times A Day 4)  Lipitor 40 Mg  Tabs (Atorvastatin Calcium) .... At Bedtime 5)  Warfarin Sodium 2 Mg  Tabs (Warfarin Sodium) .... Take As Directed By Coumadin Clinic. 6)  Metoprolol Tartrate 100 Mg Tabs (Metoprolol Tartrate) .... 1/2 Tab By Mouth Two Times A Day 7)  Ocuvite-Lutein   Caps (Multiple Vitamins-Minerals) .... Once Daily 8)  Fluticasone Propionate 50 Mcg/act Susp (Fluticasone Propionate) .Marland Kitchen.. 1-2 Sprays Each Nostril Once Daily 9)   Furosemide 80 Mg Tabs (Furosemide) .... Take One Tablet By Mouth Daily. 10)  Whole Mega- Fish Oil .Marland Kitchen.. 1 Tab By Mouth Two Times A Day 11)  Aspirin 81 Mg  Tabs (Aspirin) .Marland Kitchen.. 1 Tab By Mouth Once Daily 12)  Klor-Con 10 10 Meq Cr-Tabs (Potassium Chloride) .Marland Kitchen.. 1 Once Daily 13)  Climara 0.05 Mg/24hr Ptwk (Estradiol) .... Uad 14)  Multivitamins   Tabs (Multiple Vitamin) .Marland Kitchen.. 1 By Mouth Daily 15)  Amlodipine Besylate 5 Mg Tabs (Amlodipine Besylate) .Marland Kitchen.. 1 Once Daily 16)  Vitamin D 1000 Unit Tabs (Cholecalciferol) .... Take 1 Tablet By Mouth Once A Day  Allergies: 1)  ! Demerol 2)  Codeine Phosphate (Codeine Phosphate) 3)  Morphine Sulfate (Morphine Sulfate)  Past History:  Past Medical History: Reviewed history from 05/10/2009 and no changes required. CORONARY ARTERY DISEASE (ICD-414.00) HYPERTENSION (ICD-401.9) HYPERLIPIDEMIA (ICD-272.4) PAROXYSMAL ATRIAL FIBRILLATION, HX OF (ICD-V12.59) MYOCARDIAL INFARCTION, HX OF (ICD-412) ANEMIA, IRON DEFICIENCY, HX OF (ICD-V12.3) RENAL INSUFFICIENCY (ICD-588.9) GERD (ICD-530.81) PANIC DISORDER (ICD-300.01) ABDOMINAL PAIN, CHRONIC (ICD-789.00) IRRITABLE BOWEL SYNDROME (ICD-564.1) CEREBRAL ANEURYSM (ICD-437.3) DIVERTICULOSIS, COLON (ICD-562.10) ARTHRITIS (ICD-716.90) FECAL INCONTINENCE (ICD-787.6) H/O goiter H/O pacemker for sick sinus syndrome/ tachycardia-bradycardia syndrome Aortic insufficiency  Past Surgical History: Reviewed history from 07/29/2008 and no changes required. cholecystectomy hysterectomy surgery for subdural hematoma-Aug. 2007 Tonsillectomy Appendectomy PPM implanted by Dr Jenne Campus 1/09  Social History: Reviewed history from 05/10/2009 and no changes required. Patient is widowed.  Lives in [place], [place]. 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   Review of Systems       no fevers or chills, productive  cough, hemoptysis, dysphasia, odynophagia, melena, hematochezia, dysuria,  hematuria, rash, seizure activity, orthopnea, PND, pedal edema, claudication. Remaining systems are negative.   Vital Signs:  Patient profile:   77 year old female Height:      64 inches Weight:      153 pounds BMI:     26.36 Pulse rate:   73 / minute Resp:     18 per minute BP sitting:   115 / 80  (left arm)  Vitals Entered By: Kem Parkinson (March 17, 2010 11:31 AM)  Physical Exam  General:  Well-developed well-nourished in no acute distress.  Skin is warm and dry.  HEENT is normal.  Neck is supple. No thyromegaly.  Chest is clear to auscultation with normal expansion.  Cardiovascular exam is regular rate and rhythm.  Abdominal exam nontender or distended. No masses palpated. Extremities show no edema. neuro grossly intact    EKG  Procedure date:  03/17/2010  Findings:      Atrial pacing, right bundle branch block, left anterior fascicular block.  PPM Specifications Following MD:  Hillis Range, MD     Referring MD:  Cecille Po Vendor:  [employer]     PPM Model Number:  [id number]     PPM Serial Number:  [id number] PPM DOI:  01/30/2007     PPM Implanting MD:  NOT IMPLANTED HERE  Lead 1    Location: RA     DOI: 01/30/2007     Model #: [id number]     Serial #: [id number]     Status: active Lead 2    Location: RV     DOI: 01/30/2007     Model #: [id number]     Serial #: [id number]     Status: active  Magnet Response Rate:  BOL85 ERI  65    PPM Follow Up Pacer Dependent:  No      Episodes Coumadin:  Yes  Parameters Mode:  MVP (R)     Lower Rate Limit:  60     Upper Rate Limit:  120 Paced AV Delay:  180     Sensed AV Delay:  150 Rate Response Parameters:  Rate response-8, Threshold-Med/low, Acceleration-30 seconds, Deceleration-5 min  Impression & Recommendations:  Problem # 1:  ATRIAL FIBRILLATION (ICD-427.31)  Continue beta blocker and Coumadin. Her updated medication list for this problem includes:    Warfarin Sodium 2 Mg Tabs (Warfarin sodium) .Marland Kitchen... Take as  directed by coumadin clinic.    Metoprolol Tartrate 100 Mg Tabs (Metoprolol tartrate) .Marland Kitchen... 1/2 tab by mouth two times a day    Aspirin 81 Mg Tabs (Aspirin) .Marland Kitchen... 1 tab by mouth once daily  Her updated medication list for this problem includes:    Warfarin Sodium 2 Mg Tabs (Warfarin sodium) .Marland Kitchen... Take as directed by coumadin clinic.    Metoprolol Tartrate 100 Mg Tabs (Metoprolol tartrate) .Marland Kitchen... 1/2 tab by mouth two times a day    Aspirin 81 Mg Tabs (Aspirin) .Marland Kitchen... 1 tab by mouth once daily  Problem # 2:  PACEMAKER, PERMANENT (ICD-V45.01) Management per electrophysiology.  Problem # 3:  COUMADIN THERAPY (ICD-V58.61) Goal INR 2-3.  Problem # 4:  CORONARY ARTERY DISEASE (ICD-414.00) Continue aspirin and statin. Her updated medication list for this problem includes:    Warfarin Sodium 2 Mg Tabs (Warfarin sodium) .Marland Kitchen... Take as directed by coumadin clinic.    Metoprolol Tartrate 100 Mg Tabs (Metoprolol tartrate) .Marland Kitchen... 1/2 tab by mouth two times a day    Aspirin 81 Mg  Tabs (Aspirin) .Marland Kitchen... 1 tab by mouth once daily    Amlodipine Besylate 5 Mg Tabs (Amlodipine besylate) .Marland Kitchen... 1 once daily  Her updated medication list for this problem includes:    Warfarin Sodium 2 Mg Tabs (Warfarin sodium) .Marland Kitchen... Take as directed by coumadin clinic.    Metoprolol Tartrate 100 Mg Tabs (Metoprolol tartrate) .Marland Kitchen... 1/2 tab by mouth two times a day    Aspirin 81 Mg Tabs (Aspirin) .Marland Kitchen... 1 tab by mouth once daily    Amlodipine Besylate 5 Mg Tabs (Amlodipine besylate) .Marland Kitchen... 1 once daily  Problem # 5:  HYPERTENSION (ICD-401.9) Blood pressure controlled. Continue present medications. Her updated medication list for this problem includes:    Metoprolol Tartrate 100 Mg Tabs (Metoprolol tartrate) .Marland Kitchen... 1/2 tab by mouth two times a day    Furosemide 80 Mg Tabs (Furosemide) .Marland Kitchen... Take one tablet by mouth daily.    Aspirin 81 Mg Tabs (Aspirin) .Marland Kitchen... 1 tab by mouth once daily    Amlodipine Besylate 5 Mg Tabs (Amlodipine  besylate) .Marland Kitchen... 1 once daily  Her updated medication list for this problem includes:    Metoprolol Tartrate 100 Mg Tabs (Metoprolol tartrate) .Marland Kitchen... 1/2 tab by mouth two times a day    Furosemide 80 Mg Tabs (Furosemide) .Marland Kitchen... Take one tablet by mouth daily.    Aspirin 81 Mg Tabs (Aspirin) .Marland Kitchen... 1 tab by mouth once daily    Amlodipine Besylate 5 Mg Tabs (Amlodipine besylate) .Marland Kitchen... 1 once daily  Problem # 6:  HYPERLIPIDEMIA (ICD-272.4) Continue statin. Check lipids and liver. Her updated medication list for this problem includes:    Lipitor 40 Mg Tabs (Atorvastatin calcium) .Marland Kitchen... At bedtime  Orders: TLB-Lipid Panel (80061-LIPID) TLB-Hepatic/Liver Function Pnl (80076-HEPATIC)  Problem # 7:  RENAL INSUFFICIENCY (ICD-588.9) Now followed by nephrology.  Patient Instructions: 1)  Your physician wants you to follow-up in:6 MONTHS   You will receive a reminder letter in the mail two months in advance. If you don't receive a letter, please call our office to schedule the follow-up appointment.

## 2010-03-22 NOTE — Progress Notes (Signed)
Summary: palps for couple of weeks    unable to reach pt at # listed  Phone Note Call from Patient Call back at [phone]   Caller: Patient Reason for Call: Talk to Nurse Summary of Call: pt having palp for a couple of weeks. no sob no chest pain. 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  (I did not leave a message there)  Attempted to call pt again at the numbers we have listed - unable to reach her.  4:09pm Avie Arenas, RN Follow-up by: Charolotte Capuchin, RN,  March 14, 2010 10:23 AM  Additional Follow-up for Phone Call Additional follow up Details #1::        pt has appt scheduled with dr Jens Som 03-17-10 [name], RN  March 16, 2010 12:26 PM

## 2010-03-22 NOTE — Medication Information (Signed)
Summary: rov/sp  Anticoagulant Therapy  Managed by: Windell Hummingbird, RN Referring MD: Jens Som MD, Arlys John PCP: Kirby Funk, MD Supervising MD: Ladona Ridgel MD, Sharlot Gowda Indication 1: Atrial Fibrillation (ICD-427.31) Lab Used: [facility] Site: [facility] INR POC 1.4 INR RANGE 2 - 3  Dietary changes: no    Health status changes: no    Bleeding/hemorrhagic complications: no    Recent/future hospitalizations: no    Any changes in medication regimen? no    Recent/future dental: no  Any missed doses?: yes     Details: Recently moved residences and "probably missed some"  Is patient compliant with meds? yes       Allergies: 1)  ! Demerol 2)  Codeine Phosphate (Codeine Phosphate) 3)  Morphine Sulfate (Morphine Sulfate)  Anticoagulation Management History:      The patient is taking warfarin and comes in today for a routine follow up visit.  Positive risk factors for bleeding include an age of 75 years or older and presence of serious comorbidities.  The bleeding index is 'intermediate risk'.  Positive CHADS2 values include History of HTN and Age > 31 years old.  The start date was 06/16/2008.  Her last INR was 2.4 ratio.  Anticoagulation responsible provider: Ladona Ridgel MD, Sharlot Gowda.  INR POC: 1.4.  Cuvette Lot#: [id number].  Exp: 01/2011.    Anticoagulation Management Assessment/Plan:      The patient's current anticoagulation dose is Warfarin sodium 2 mg  tabs: Take as directed by coumadin clinic..  The target INR is 2.0-3.0.  The next INR is due 03/31/2010.  Anticoagulation instructions were given to patient.  Results were reviewed/authorized by Windell Hummingbird, RN.  She was notified by Windell Hummingbird, RN.         Prior Anticoagulation Instructions: INR 2.3 Continue with 1.5 tablets every day except Monday take 1 tablet. Recheck in 4 weeks.  Current Anticoagulation Instructions: INR 1.4 Take 2 tablets today and tomorrow.  Then resume taking 1 1/2 tablets every day, except take 1 tablet on  Mondays. Recheck in 2 weeks.

## 2010-03-31 ENCOUNTER — Encounter (INDEPENDENT_AMBULATORY_CARE_PROVIDER_SITE_OTHER): Payer: Medicare Other

## 2010-03-31 ENCOUNTER — Encounter: Payer: Self-pay | Admitting: Internal Medicine

## 2010-03-31 DIAGNOSIS — Z7901 Long term (current) use of anticoagulants: Secondary | ICD-10-CM

## 2010-03-31 DIAGNOSIS — I4891 Unspecified atrial fibrillation: Secondary | ICD-10-CM

## 2010-03-31 LAB — CONVERTED CEMR LAB: POC INR: 2.3

## 2010-04-05 NOTE — Medication Information (Signed)
Summary: rov/pc  Anticoagulant Therapy  Managed by: Cloyde Reams, RN, BSN Referring MD: Jens Som MD, Arlys John PCP: Kirby Funk, MD Supervising MD: Ladona Ridgel MD, Sharlot Gowda Indication 1: Atrial Fibrillation (ICD-427.31) Lab Used: [facility] Site: [facility] INR POC 2.3 INR RANGE 2 - 3  Dietary changes: no    Health status changes: no    Bleeding/hemorrhagic complications: no    Recent/future hospitalizations: no    Any changes in medication regimen? no    Recent/future dental: no  Any missed doses?: no       Is patient compliant with meds? yes       Allergies: 1)  ! Demerol 2)  Codeine Phosphate (Codeine Phosphate) 3)  Morphine Sulfate (Morphine Sulfate)  Anticoagulation Management History:      The patient is taking warfarin and comes in today for a routine follow up visit.  Positive risk factors for bleeding include an age of 75 years or older and presence of serious comorbidities.  The bleeding index is 'intermediate risk'.  Positive CHADS2 values include History of HTN and Age > 52 years old.  The start date was 06/16/2008.  Her last INR was 2.4 ratio.  Anticoagulation responsible provider: Ladona Ridgel MD, Sharlot Gowda.  INR POC: 2.3.  Cuvette Lot#: [id number].  Exp: 03/2011.    Anticoagulation Management Assessment/Plan:      The patient's current anticoagulation dose is Warfarin sodium 2 mg  tabs: Take as directed by coumadin clinic..  The target INR is 2.0-3.0.  The next INR is due 04/20/2010.  Anticoagulation instructions were given to patient.  Results were reviewed/authorized by Cloyde Reams, RN, BSN.  She was notified by Cloyde Reams RN.         Prior Anticoagulation Instructions: INR 1.4 Take 2 tablets today and tomorrow.  Then resume taking 1 1/2 tablets every day, except take 1 tablet on Mondays. Recheck in 2 weeks.  Current Anticoagulation Instructions: INR 2.3  Continue on same dosage 1.5 tablets daily except 1 tablet on Mondays.  Recheck in 3 weeks.

## 2010-04-05 NOTE — Letter (Signed)
Summary: [facility] Patient Note   [facility] Patient Note   Imported By: Roderic Ovens 03/30/2010 11:11:39  _____________________________________________________________________  External Attachment:    Type:   Image     Comment:   External Document

## 2010-04-15 ENCOUNTER — Ambulatory Visit: Payer: Medicare Other

## 2010-04-15 ENCOUNTER — Telehealth: Payer: Self-pay | Admitting: Cardiovascular Disease

## 2010-04-15 DIAGNOSIS — N289 Disorder of kidney and ureter, unspecified: Secondary | ICD-10-CM

## 2010-04-15 DIAGNOSIS — N259 Disorder resulting from impaired renal tubular function, unspecified: Secondary | ICD-10-CM

## 2010-04-15 LAB — RENAL FUNCTION PANEL
Albumin: 3.8 g/dL (ref 3.5–5.2)
CO2: 32 mEq/L (ref 19–32)
Chloride: 98 mEq/L (ref 96–112)
Creat: 2.27 mg/dL — ABNORMAL HIGH (ref 0.40–1.20)
Phosphorus: 3.5 mg/dL (ref 2.3–4.6)
Sodium: 136 mEq/L (ref 135–145)

## 2010-04-15 NOTE — Progress Notes (Signed)
Pt walked in today after phoning in with dizziness and near syncopal episode at home.  Pt c/o being weak.  Also notes that she has had diarrhea today.  She is unsure of furosemide dosage at this time.  Call placed to [facility] to confirm furosemide 80 mg bid. Also talked with Malachi Bonds Dr. Darrick Penna nurse as to dosage and found pt had  Missed lab appt on 3/20 for renal profile & pth. We will draw those here today. Pt will hold Lasix tomorrow then resume on Sunday. Fluids encouraged b/c of much diarrhea today.  She will call back if she has further problems or go to the ED if condition worsens. Lisabeth Devoid RN

## 2010-04-15 NOTE — Telephone Encounter (Signed)
Pt walked in, see nursing note by debbie gray

## 2010-04-18 ENCOUNTER — Telehealth: Payer: Self-pay | Admitting: *Deleted

## 2010-04-18 LAB — PTH, INTACT AND CALCIUM
Calcium, Total (PTH): 8.7 mg/dL (ref 8.4–10.5)
PTH: 405.6 pg/mL — ABNORMAL HIGH (ref 14.0–72.0)

## 2010-04-18 NOTE — Telephone Encounter (Signed)
pt aware of results  

## 2010-04-18 NOTE — Telephone Encounter (Signed)
Message copied by [name] on Mon Apr 18, 2010  4:37 PM ------      Message from: [name]      Created: Fri Apr 15, 2010  7:13 PM       ok

## 2010-04-19 ENCOUNTER — Telehealth: Payer: Self-pay | Admitting: Cardiology

## 2010-04-19 ENCOUNTER — Encounter: Payer: Self-pay | Admitting: *Deleted

## 2010-04-19 NOTE — Telephone Encounter (Signed)
Pt states she needs a list of exercises she can do. [address] [place] [address] attn Britta Mccreedy adams

## 2010-04-19 NOTE — Telephone Encounter (Signed)
Spoke with pt, she has no restrictions regarding exercise. Pt request a letter to be sent to [facility].

## 2010-04-20 ENCOUNTER — Encounter: Payer: Medicare Other | Admitting: *Deleted

## 2010-04-25 ENCOUNTER — Ambulatory Visit (INDEPENDENT_AMBULATORY_CARE_PROVIDER_SITE_OTHER): Payer: Medicare Other | Admitting: *Deleted

## 2010-04-25 DIAGNOSIS — Z7901 Long term (current) use of anticoagulants: Secondary | ICD-10-CM

## 2010-04-25 DIAGNOSIS — I4891 Unspecified atrial fibrillation: Secondary | ICD-10-CM

## 2010-04-25 NOTE — Patient Instructions (Signed)
Take 2 tablets today, then resume same dosage 1.5 tablets daily except 1 tablet on Mondays.  Recheck in 2 weeks.

## 2010-05-10 ENCOUNTER — Encounter: Payer: Medicare Other | Admitting: *Deleted

## 2010-05-17 ENCOUNTER — Ambulatory Visit (INDEPENDENT_AMBULATORY_CARE_PROVIDER_SITE_OTHER): Payer: Medicare Other | Admitting: *Deleted

## 2010-05-17 DIAGNOSIS — I4891 Unspecified atrial fibrillation: Secondary | ICD-10-CM

## 2010-05-17 LAB — POCT INR: INR: 2

## 2010-05-18 ENCOUNTER — Encounter: Payer: Medicare Other | Admitting: *Deleted

## 2010-06-06 ENCOUNTER — Other Ambulatory Visit: Payer: Self-pay | Admitting: Cardiology

## 2010-06-07 ENCOUNTER — Telehealth: Payer: Self-pay | Admitting: Cardiology

## 2010-06-07 ENCOUNTER — Other Ambulatory Visit (INDEPENDENT_AMBULATORY_CARE_PROVIDER_SITE_OTHER): Payer: Medicare Other | Admitting: *Deleted

## 2010-06-07 ENCOUNTER — Ambulatory Visit (INDEPENDENT_AMBULATORY_CARE_PROVIDER_SITE_OTHER): Payer: Medicare Other | Admitting: *Deleted

## 2010-06-07 DIAGNOSIS — R002 Palpitations: Secondary | ICD-10-CM

## 2010-06-07 DIAGNOSIS — I4891 Unspecified atrial fibrillation: Secondary | ICD-10-CM

## 2010-06-07 LAB — BASIC METABOLIC PANEL
BUN: 24 mg/dL — ABNORMAL HIGH (ref 6–23)
CO2: 29 mEq/L (ref 19–32)
Calcium: 8.7 mg/dL (ref 8.4–10.5)
Chloride: 99 mEq/L (ref 96–112)
Creatinine, Ser: 2.3 mg/dL — ABNORMAL HIGH (ref 0.4–1.2)
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
## 2010-06-07 NOTE — Telephone Encounter (Signed)
Per Alona Bene, pt has appointment today for coumadin @ 1: 45 p.pm. Can a BMET  be drawn for Dr. Kirby Funk. Dx code  785.1  [place].  Fax # [phone]. Attention Alona Bene
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
## 2010-06-07 NOTE — Cardiovascular Report (Signed)
NAME:  Tracey [name], Tracey [name] NO.:  [id number]   MEDICAL RECORD NO.:  [id number]          PATIENT TYPE:  INP   LOCATION:  2807                         FACILITY:  [facility]   PHYSICIAN:  Nicki Guadalajara, M.D.     DATE OF BIRTH:  03-Feb-1922   DATE OF PROCEDURE:  DATE OF DISCHARGE:                            CARDIAC CATHETERIZATION   PROCEDURE:  Left heart catheterization; cine coronary angiography; cine  left ventriculography; percutaneous coronary intervention of the right  with PTCA of the right coronary artery.   INDICATIONS:  [name] is a very pleasant 77 year old patient  of Drs. Alanda Amass and Valentina Lucks who is status post acute coronary  intervention to her right coronary artery in December 2007 in a setting  of ST elevation myocardial infarction.  She did have a 60% LAD lesion  and underwent tandem stenting to her RCA with 3.5 x 33 and 18-mm stents.  She had been doing fairly well but again began to notice episodes of  chest pain.  She saw Dr. Alanda Amass yesterday as a walk-in.  She was  admitted to [facility], and cardiac catheterization was  recommended.   PROCEDURE:  After premedication with Valium 4 mg intravenously, the  patient was prepped and draped in the usual fashion.  The right femoral  artery was punctured anteriorly, and a 6-French sheath was inserted.  Diagnostic catheterization was done utilizing 6-French Judkins 4 left  and right coronary catheters.  A 6-French pigtail catheter was used for  RAO ventriculography.  In an attempt to conserve contrast, LAO  ventriculography and distal aortography was not performed, in light of  her age and creatinine clearance.  With a demonstration of apparent  thrombus in the stent in the right coronary artery with eccentric  narrowing of approximately 70%, the decision was made to attempt  intervention.  Due to the probable clot, bivalirudin was used for direct  thrombin inhibition as the anticoagulation.   The patient had been on  daily Plavix and received an additional 150 mg of oral Plavix.  The  intervention was done utilizing an FR-4 guide and ATW wire.  IC  nitroglycerin was administered down the right coronary artery.  A 4.0- x  20-mm Cordis Dura Star balloon was noncompliant.  It was used for the  intervention with sequential dilatations ultimately up to a maximum of  18, corresponding to a 4.13-mm size.  Scout angiography confirmed  excellent angiographic result with complete resolution of the previous  haziness and eccentricity to the lesion.  There is brisk TIMI-III flow.  She did develop a burst of SVT during the seizure and received 2.5 mg of  IV Lopressor.   HEMODYNAMIC DATA:  Central aortic pressure was 182/84.  Left ventricle  pressure is 182/9.   ANGIOGRAPHIC DATA:  Left main coronary was angiographically normal and  bifurcated into the LAD and left circumflex system.   The LAD gave rise to a proximal diminutive diagonal followed by a large  diagonal vessel.  The mid LAD had 50% to 60% smooth narrowing unchanged.   The circumflex vessel was moderate sized vessel  and gave rise to one  major marginal vessel.   The right coronary was a very large, dominant vessel.  There was  segmental 20% to 30% luminal irregularities in the proximal to mid  segment.  After the acute margin, there were the previously placed  tandem stents.  In the region of the crux, there appeared to be  eccentricity with haziness and probable thrombus formation with  narrowing up to 60% to 70% some views.  The vessel supplied a large PDA  system which extended and supplied the entire inferior wall towards the  apex.   RAO ventriculography revealed normal global LV contractility with focal  mid posterobasal hypocontractility.   Following successful coronary intervention to the right coronary artery  utilizing a 4.0- x 20-mm noncompliant Dura Star Cordis balloon, dilated  up to 18 atmospheres,  corresponding to 4.13-mm size, the lesion was  reduced to 0%.  There was complete resolution of previous haziness and  brisk TIMI-III flow.   IMPRESSION:  1. Normal LV function with residual mild mid to basal posterior      hypocontractility.  2. A 50% to 60% smooth narrowing in the mid LAD.  3. Luminal irregularity of 20% to 30% in the proximal mid right      coronary artery with 60% to 70% haziness with probable thrombus an      eccentric narrowing in the previously placed RCA stent beyond the      crux.  4. Normal circumflex system.  5. Successful PTCA within the stented segment of the right coronary      artery resulting in complete resolution of thrombus and with      residual narrowing of 0% and brisk TIMI-III flow done with      bivalirudin, oral Plavix, and IC nitroglycerin.  6. Brief episode of supraventricular tachycardia treated with IV      Lopressor.           ______________________________  Nicki Guadalajara, M.D.     TK/MEDQ  D:  06/14/2006  T:  06/14/2006  Job:  [id number]   cc:   Gerlene Burdock A. Alanda Amass, M.D.  Thora Lance, M.D.
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
## 2010-06-07 NOTE — Assessment & Plan Note (Signed)
[facility]                         GASTROENTEROLOGY OFFICE NOTE   NAME:Tracey [name]                     MRN:          [id number]  DATE:07/18/2006                            DOB:          1922/07/10    [name] is a delightful 77 year old white female who has irritable  bowel syndrome/diverticulosis.  She has fecal urgency.  We have seen her  for this problem in January of this year.  She had a colonoscopy on  February 08, 2006 with findings of mild diverticulosis of the colon.  Patient was put on Benefiber on a daily basis and Levsin sublingually,  which were both effective until the Levsin was discontinued.  She now  has not taken any medication for several weeks.   PHYSICAL EXAMINATION:  VITAL SIGNS:  Blood pressure 152/80, pulse 64.  Weight 142 pounds.  GENERAL:  She was alert and oriented in no distress, appearing younger  than her stated age.  LUNGS:  Clear to auscultation.  COR:  Normal S1 and S2.  ABDOMEN:  Soft with normoactive bowel sounds.  No tenderness.  No  distention.  RECTAL:  Not done today.   IMPRESSION:  An 77 year old white female with irritable bowel  syndrome/diverticulosis.  She is in need of new medication to control  her irritable bowel syndrome.   PLAN:  1. Bentyl 10 mg p.o. b.i.d.  2. Continue Benifiber daily.  3. Return to [place] on a p.r.n. basis.     Hedwig Morton. Juanda Chance, MD  Electronically Signed    DMB/MedQ  DD: 07/18/2006  DT: 07/19/2006  Job #: [id number]   cc:   Nicki Guadalajara, M.D.  Thora Lance, M.D.
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
## 2010-06-10 NOTE — Procedures (Signed)
[facility]  Patient:    Tracey [name] Visit Number: [id number] MRN: [id number]          Service Type: END Location: ENDO Attending Physician:  Nelda Marseille Proc. Date: 06/26/00 Admit Date:  06/26/2000   CC:         Miguel Aschoff, M.D.                           Procedure Report  PROCEDURE:  Colonoscopy with biopsy.  INDICATIONS:  Patient with family history of colon polyps due for colonic screening.  Resolved diarrhea.  INFORMED CONSENT:  Consent was signed after risk, benefits, methods and options were thoroughly discussed in the office.  MEDICINES USED:  Versed 10 only.  DESCRIPTION OF PROCEDURE:  Rectal inspection was pertinent for external hemorrhoids.  Digital exam was pertinent for decreased sphincter tone but no masses.  The pediatric video colonoscope was inserted and once through the tortuous sigmoid was easily able to advance to the cecum.  This did require some abdominal pressure.  A small mid sigmoid polyp was seen on insertion which was cold biopsied x 1.  Also in the mid transverse, two small AVMs were seen on insertion.  The cecum was identified by the appendiceal orifice and the ileocecal valve.  In fact, the scope was inserted a short ways in the terminal ileum which was normal.  Photodocumentation was obtained.  The scope was slowly withdrawn.  The prep was adequate.  There was some liquid stool that required washing and suctioning.  On slow withdrawal through the colon, the cecum, ascending, and transverse was normal except for the two AVMs seen on insertion.  As the scope was withdrawn around the left side of the colon, a questionable tiny mid descending was seen and was cold biopsied x 2 and put in the same container with the other polyp.  The scope was slowly withdrawn.  She did have a tortuous sigmoid and some difficulty holding air, so some lesions could have been missed.  The polypectomy side had a clot on it that  was seen on the withdrawal, but no obvious residual polyp was seen.  The scope was further withdrawn.  No other abnormalities were seen as we slowly withdrew back to the rectum.  The scope was retroflexed, pertinent for some tiny internal hemorrhoids.  The scope was straightened, air was withdrawn, and the scope removed.  The patient tolerated the procedure well, and there was no obvious immediate complication.  ENDOSCOPIC DIAGNOSES: 1. External greater than internal hemorrhoids with decreased sphincter tone. 2. Two tiny to small sigmoid and descending polyps, cold biopsied. 3. Two transverse small AVMs. 4. Otherwise within normal limits to the terminal ileum except for a tortuous    sigmoid.  PLAN:  Yearly rectals and guaiacs per Dr. Tenny Craw.  Happy to see back p.r.n. Await pathology but probably recheck colon screening in five years if doing well. Attending Physician:  Nelda Marseille DD:  06/26/00 TD:  06/26/00 Job: [id number]
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
## 2010-06-10 NOTE — Op Note (Signed)
NAMESALEEN, Tracey [name]                 ACCOUNT NO.:  [id number]   MEDICAL RECORD NO.:  [id number]                   PATIENT TYPE:  AMB   LOCATION:  DSC                                  FACILITY:  [facility]   PHYSICIAN:  Rose Phi. Maple Hudson, M.D.                DATE OF BIRTH:  Feb 16, 1922   DATE OF PROCEDURE:  08/11/2003  DATE OF DISCHARGE:                                 OPERATIVE REPORT   PREOPERATIVE DIAGNOSIS:  Intraductal papilloma of the right breast.   POSTOPERATIVE DIAGNOSIS:  Intraductal papilloma of the right breast.   OPERATION:  Excision of duct system in right breast.   SURGEON:  Rose Phi. Maple Hudson, M.D.   ANESTHESIA:  MAC.   DESCRIPTION OF PROCEDURE:  The patient placed on the operating table and the  right breast prepped and draped in the usual fashion.  It appears that the  discharge came actually from a central duct very close to the nipple so a  curved circumareolar incision centered in the 6 o'clock position was then  outlined with the marking pencil and the area thoroughly infiltrated with  local anesthetic mixture.  A curved incision was then made and we then  raised up a subareolar flap and then at the central portion, as I cut across  the duct, one could actually see the papilloma which was excised and that  whole duct system was then excised.  Hemostasis obtained with a cautery.  Subcuticular closure of 4-0 Monocryl and Steri-Strips carried out.  Dressing  applied.  The patient transferred to the recovery room in satisfactory  condition having tolerated the procedure well.                                               Rose Phi. Maple Hudson, M.D.    PRY/MEDQ  D:  08/11/2003  T:  08/11/2003  Job:  [id number]
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
## 2010-06-10 NOTE — Cardiovascular Report (Signed)
NAME:  Tracey [name], Tracey [name] NO.:  [id number]   MEDICAL RECORD NO.:  [id number]          PATIENT TYPE:  INP   LOCATION:  2901                         FACILITY:  [facility]   PHYSICIAN:  Madaline Savage, M.D.DATE OF BIRTH:  April 18, 1922   DATE OF PROCEDURE:  12/25/2005  DATE OF DISCHARGE:                            CARDIAC CATHETERIZATION   PROCEDURES PERFORMED:  1. Selective coronary angiography by Judkins technique.  2. Retrograde left heart catheterization.  3. Left ventricular angiography.  4. Percutaneous femoral AngioSeal closure of the right femoral artery.  5. Percutaneous intracoronary artery stenting of the distal LAD over a      long segment.   COMPLICATIONS:  None.   ENTRY SITE:  Right femoral.   DYE USED:  Omnipaque.   PATIENT PROFILE:  [name] is a delightful 77 year old white female, who  is a cardiology patient Dr. Susa Griffins, who entered the hospital  with a history of chest pain on and off for about the last week, more  intense at the time of presentation to the hospital.  She is treated for  hypertension and hyperlipidemia.  The patient was managed in our  coronary care unit for two days prior to coming to the cath lab and  chest pain severity had diminished to from 10/10 to approximately 2/10.  IV nitroglycerin headache was reported.  Cardiac enzymes have peaked at  319 CK, MB of 45 and troponin I of 6.39.  EKG has shown sinus  bradycardia with right bundle branch block and T-wave inversions in the  inferior leads.  These T-wave inversions are evolutionary during this  hospitalization.  The patient was brought to the cath lab today  electively after informed consent was achieved.  The case went well.  No  complications occurred.  The case was somewhat long, i.e. one and half  hours to two hours.   RESULTS:  Pressures:  Left ventricular pressure was 120/14, end-  diastolic pressure 30.  Central aortic pressure 115/55, mean of 80.  There  was no significant aortic valve gradient by pullback technique,  however.   ANGIOGRAPHIC RESULTS:  No valvular calcification, pericardial  calcifications or coronary calcification was seen under fluoroscopy.   Left main coronary artery was large in diameter, short in length and was  angiographically patent.   The LAD courses to cardiac apex and bifurcates at the apex.  LAD gives  rise to 4-5 septal perforator branches, all of small diameter.  LAD  contains a pair of eccentric narrowing; the first of approximately 50-  60%, the second more like 30%.  No significant stenoses were noted in  the LAD or diagonal.   Left circumflex coronary artery is nondominant giving rise to a large  acute obtuse marginal branch.  There is one atrial circumflex branch and  the circumflex itself which is nondominant bifurcates.  No lesions were  seen.   Right coronary artery is a huge and dominant vessel of the circulation.  Very, very faint calcifications noted in the midportion of this vessel.  The vessel itself contains luminal irregularities proximally amounting  to 40-50% narrowing just  after two large acute marginal branches arise.  There is a 100% occlusion in the RCA.  There is a nipple which would  predict favorability of crossing with the guidewire, however.   There was blushing of dye distally from homocollateral flow.  It also  should be noted that the patient had retrograde collateral flow from LAD  at the beginning of the case.   LV angiography shows hyperdynamic wall motion of the anterior wall.  Good contractility of the inferior apical wall and severe hypokinesis of  the inferobasal segment.  I would estimate mild to moderate mitral  regurgitation without obvious evidence of any prolapse.  Ascending aorta  appears at the upper limits of normal in terms of diameter.  This is  qualitatively speaking and I do not see any calcification.   Percutaneous coronary intervention was performed  after the patient was  loaded with Integrilin by double bolus technique and heparin and the ACT  achieved was in the neighborhood of 380 seconds.  We checked it several  times during the case and it remained above 200 and all times.   Guide catheter used was 6-French catheter system and was a 3-D RC by  Cordis.  The first guidewire used was a Research scientist (physical sciences) which failed to  cross the area of total occlusion.  The second wire used was an Abbott  guidewire that was medium support with a stiffer tip, the name of which  escapes me right now.  See cath lab folder.  This guide wire was able to  penetrate the area of total occlusion and was brought to rest and  initially for what was an identified portion of the distal RCA.  We  later realized that it was in the true lumen of the distal RCA and  posterior descending.   A Maverick balloon 2.5 x 10 was used to predilate the vessel; not much  in the way of distal flow was seen but there was noted to be just enough  of a vessel distally to warrant further intervention.   A second Maverick balloon was placed; this time 2.5 x 20 and a more  aggressive dilatation was performed and now we saw dye in the distal RCA  and posterior descending.  There was noted to be tight stenosis just  after the 100% occlusion.   After performing multiple orthogonal views, I surmised that the lesion  length was in the neighborhood of 40-50 mm and I knew that I would have  to use two stents to cover that territory.  The first stent deployed was  a 3.5 x 33 mm Cypher stent that was brought to rest just before the  distal RCA becomes the PDA.  That stent was deployed to inflation  pressures of roughly 14 atmospheres of pressure.  Then a second stent  was placed more proximally.  It was an 18 mm stent 3.5 Cypher and it was  overlapped with the already deployed distal RCA stent.  Now this single  stent, basically 50 mm in length, was postdilated with a Quantum Maverick  balloon 4.0 x 20 making sure that the overlap was well covered.  Those inflation pressures were more in the neighborhood of 16-18  atmospheres of pressure.   The final result was that the 100% occlusion at the junction between the  mid-RCA which was patent and the distal RCA that was occluded now was  widely patent with TIMI III flow and no residual stenosis.  Multiple  orthogonal  views were taken of the vessel after stenting and post  dilatation and we were impressed that there was wide patency of the  distal RCA, preservation of posterior descending and a small  posterolateral branch was seen.  There was TIMI III flow.  No  complications occurred during the case.  The patient was somewhat  uncomfortable but this was transient.  We did a 45 degrees RAO hand  injection of the right femoral artery and found the indwelling sheath  location to be favorable for AngioSeal; that was accomplished easily  with excellent hemostasis.   FINAL DIAGNOSES:  1. Acute coronary syndrome with non-ST segment elevation myocardial      infarction inferior clinically.  2. Basically single-vessel coronary disease with only mild luminal      irregularities in the left anterior descending artery but 100%      occlusion of the right coronary artery at the junction between the      mid-right coronary and the beginning of the of the distal right      coronary artery.  3. Successful stenting with reduction of 100% occlusion to 0% residual      and restoration of TIMI III flow.  4. Successful AngioSeal closure of the right femoral artery.   PLAN:  The patient will be sent back to the coronary care unit.  We will  begin ambulation with phase I cardiac rehab.  We will anticipate a  discharge hopefully in 40 hours and we will continue Plavix loading.  We  will be covered with Integrilin for 18 hours while Plavix loading is at  high risk.  The patient will be continued on her ACE inhibitor, beta  blockade and  aspirin.           ______________________________  Madaline Savage, M.D.     WHG/MEDQ  D:  12/25/2005  T:  12/26/2005  Job:  [id number]   cc:   Gerlene Burdock A. Alanda Amass, M.D.  [facility]  [facility]

## 2010-06-13 ENCOUNTER — Encounter: Payer: Self-pay | Admitting: Cardiology

## 2010-06-24 ENCOUNTER — Other Ambulatory Visit: Payer: Self-pay | Admitting: *Deleted

## 2010-06-24 MED ORDER — POTASSIUM CHLORIDE 10 MEQ PO TBCR: 10.0000 meq | EXTENDED_RELEASE_TABLET | Freq: Every day | ORAL | Status: DC

## 2010-07-05 ENCOUNTER — Ambulatory Visit (INDEPENDENT_AMBULATORY_CARE_PROVIDER_SITE_OTHER): Payer: Medicare Other | Admitting: *Deleted

## 2010-07-05 DIAGNOSIS — I4891 Unspecified atrial fibrillation: Secondary | ICD-10-CM

## 2010-07-26 ENCOUNTER — Encounter: Payer: Self-pay | Admitting: Cardiology

## 2010-07-28 ENCOUNTER — Ambulatory Visit (INDEPENDENT_AMBULATORY_CARE_PROVIDER_SITE_OTHER): Payer: Medicare Other | Admitting: Cardiology

## 2010-07-28 ENCOUNTER — Ambulatory Visit (INDEPENDENT_AMBULATORY_CARE_PROVIDER_SITE_OTHER): Payer: Medicare Other | Admitting: *Deleted

## 2010-07-28 ENCOUNTER — Encounter: Payer: Self-pay | Admitting: Cardiology

## 2010-07-28 VITALS — BP 110/66 | HR 61 | Ht 61.0 in | Wt 152.0 lb

## 2010-07-28 DIAGNOSIS — N259 Disorder resulting from impaired renal tubular function, unspecified: Secondary | ICD-10-CM

## 2010-07-28 DIAGNOSIS — I4891 Unspecified atrial fibrillation: Secondary | ICD-10-CM

## 2010-07-28 DIAGNOSIS — Z950 Presence of cardiac pacemaker: Secondary | ICD-10-CM

## 2010-07-28 DIAGNOSIS — I10 Essential (primary) hypertension: Secondary | ICD-10-CM

## 2010-07-28 DIAGNOSIS — E785 Hyperlipidemia, unspecified: Secondary | ICD-10-CM

## 2010-07-28 DIAGNOSIS — I2510 Atherosclerotic heart disease of native coronary artery without angina pectoris: Secondary | ICD-10-CM

## 2010-07-28 NOTE — Assessment & Plan Note (Signed)
Continue statin. 

## 2010-07-28 NOTE — Progress Notes (Signed)
HPI: Pleasant female with a past medical history of paroxysmal atrial fibrillation, status post pacemaker placement due to tachybradycardia syndrome, coronary artery disease with prior PCI of the right coronary artery in 2007 with drug-eluting stents.  A myoview in November of 2010 showed an ejection fraction of 57%. There was possible mild anterior ischemia but felt more likely to be probable soft tissue attenuation. We are treating medically. Echocardiogram performed in April of 2011 showed normal LV function, elevated left ventricular filling pressure , mild to moderate aortic insufficiency, mild mitral regurgitation and mild biatrial enlargement. We previously discontinued her amiodarone secondary to a tremor. She has also been seen by nephrology for her renal insufficiency. Since I last saw her in Feb 2012, he denies dyspnea, chest pain or syncope. No pedal edema. She does occasionally have palpitations described as a flutter.   Current Outpatient Prescriptions  Medication Sig Dispense Refill  . aspirin 81 MG tablet Take 81 mg by mouth daily.        Marland Kitchen atorvastatin (LIPITOR) 40 MG tablet Take 40 mg by mouth daily.        . calcitRIOL (ROCALTROL) 0.25 MCG capsule Take 0.25 mcg by mouth daily.        . Cholecalciferol (VITAMIN D) 1000 UNITS capsule Take 1,000 Units by mouth daily.        . diazepam (VALIUM) 5 MG tablet Take 5 mg by mouth daily as needed.        Marland Kitchen estradiol (CLIMARA - DOSED IN MG/24 HR) 0.05 mg/24hr Place 1 patch onto the skin once a week. UAD       . fish oil-omega-3 fatty acids 1000 MG capsule Take 1 g by mouth daily.        . fluticasone (FLONASE) 50 MCG/ACT nasal spray Place 2 sprays into the nose daily.        . folic acid (FOLVITE) 1 MG tablet Take 1 mg by mouth 2 (two) times daily.        . furosemide (LASIX) 80 MG tablet Take 80 mg by mouth daily.        . Melatonin 3 MG TABS Take by mouth at bedtime.        . metoprolol (LOPRESSOR) 100 MG tablet        . Multiple Vitamin  (MULTIVITAMIN) capsule Take 1 capsule by mouth daily.        . multivitamin-lutein (OCUVITE-LUTEIN) CAPS Take 1 capsule by mouth daily.        . pantoprazole (PROTONIX) 40 MG tablet Take 40 mg by mouth daily.        . potassium chloride (KLOR-CON) 10 MEQ CR tablet Take by mouth. Take 2 tablets in the am and one tablet in the pm       . triamcinolone (NASACORT) 55 MCG/ACT nasal inhaler Place 2 sprays into the nose daily.        Marland Kitchen warfarin (COUMADIN) 2 MG tablet Take by mouth as directed.        Marland Kitchen DISCONTD: metoprolol (LOPRESSOR) 100 MG tablet TAKE 1 TABLET TWICE A DAY  60 tablet  0  . DISCONTD: potassium chloride (KLOR-CON) 10 MEQ CR tablet Take 1 tablet (10 mEq total) by mouth daily.  30 tablet  6  . DISCONTD: amLODipine (NORVASC) 5 MG tablet Take 5 mg by mouth daily.           Past Medical History  Diagnosis Date  . Coronary atherosclerosis of unspecified type of vessel, native or graft   .  Unspecified essential hypertension   . Other and unspecified hyperlipidemia   . Personal history of other diseases of circulatory system   . Old myocardial infarction   . Personal history of diseases of blood and blood-forming organs   . Unspecified disorder resulting from impaired renal function   . Esophageal reflux   . Panic disorder without agoraphobia   . Abdominal pain, unspecified site   . Irritable bowel syndrome   . Cerebral aneurysm, nonruptured   . Diverticulosis of colon (without mention of hemorrhage)   . Arthropathy, unspecified, site unspecified   . Incontinence of feces   . Personal history of goiter   . Pacemaker     for sick sinus syndrome/ tachycardia-bradycardia syndrome  . Aortic insufficiency     Past Surgical History  Procedure Date  . Cholecystectomy   . Vesicovaginal fistula closure w/ tah   . Surgery for subdural hematoma   . Tonsillectomy   . Appendectomy   . Ppm implant     by Dr. Jenne Campus 1/09    History   Social History  . Marital Status: Widowed     Spouse Name: N/A    Number of Children: 1  . Years of Education: N/A   Occupational History  . Beautician    Social History Main Topics  . Smoking status: Never Smoker   . Smokeless tobacco: Not on file  . Alcohol Use: No  . Drug Use: Not on file  . Sexually Active: Not on file   Other Topics Concern  . Not on file   Social History Narrative  . No narrative on file    ROS: no fevers or chills, productive cough, hemoptysis, dysphasia, odynophagia, melena, hematochezia, dysuria, hematuria, rash, seizure activity, orthopnea, PND, pedal edema, claudication. Remaining systems are negative.  Physical Exam: Well-developed well-nourished in no acute distress.  Skin is warm and dry.  HEENT is normal.  Neck is supple. No thyromegaly.  Chest is clear to auscultation with normal expansion.  Cardiovascular exam is regular rate and rhythm.  Abdominal exam nontender or distended. No masses palpated. Extremities show no edema. neuro grossly intact  ECG Atrial paced rhythm, right bundle branch block, left axis deviation.

## 2010-07-28 NOTE — Assessment & Plan Note (Signed)
Patient in atrial paced rhythm. Continue beta blocker and Coumadin. She is having occasional palpitations. However she did not tolerate amiodarone secondary to her tremor. Her renal insufficiency would preclude other antiarrhythmics. She would not be a  Candidate for 1C agents because of coronary disease.

## 2010-07-28 NOTE — Assessment & Plan Note (Signed)
Management per electrophysiology. 

## 2010-07-28 NOTE — Patient Instructions (Signed)
Your physician wants you to follow-up in: 6 MONTHS WITH DR CRENSHAW You will receive a reminder letter in the mail two months in advance. If you don't receive a letter, please call our office to schedule the follow-up appointment.  

## 2010-07-28 NOTE — Assessment & Plan Note (Signed)
Blood pressure controlled. Continue present medications. 

## 2010-07-28 NOTE — Assessment & Plan Note (Signed)
Continue aspirin and statin. 

## 2010-07-28 NOTE — Assessment & Plan Note (Signed)
Followed by nephrology. 

## 2010-08-02 ENCOUNTER — Telehealth: Payer: Self-pay | Admitting: Cardiology

## 2010-08-02 ENCOUNTER — Encounter: Payer: Medicare Other | Admitting: *Deleted

## 2010-08-02 NOTE — Telephone Encounter (Signed)
Pt states she has been having palps for 4 months, she states she wasn't having them at time of appt w/Dr Jens Som last week she states today BP 130/73 HR 91 then came down to 60, she is due for pacer check in Aug discussed w/Paula went ahead and sch appt for 7/16 at 3:30 w/device clinic

## 2010-08-02 NOTE — Telephone Encounter (Signed)
Patient  is aware that [name] is off today. C/O irregular heart beat. Pt think she should wear a heart monitor.

## 2010-08-08 ENCOUNTER — Ambulatory Visit (INDEPENDENT_AMBULATORY_CARE_PROVIDER_SITE_OTHER): Payer: Medicare Other | Admitting: *Deleted

## 2010-08-08 DIAGNOSIS — I495 Sick sinus syndrome: Secondary | ICD-10-CM

## 2010-08-08 DIAGNOSIS — I4891 Unspecified atrial fibrillation: Secondary | ICD-10-CM

## 2010-08-08 LAB — PACEMAKER DEVICE OBSERVATION
AL IMPEDENCE PM: 512 Ohm
BMOD-0002: 8
BMOD-0003: 30
BPAC-0003RV: 0.4 ms
BRDY-0002: 60 {beats}/min
BRDY-0003: 120 {beats}/min
BSEN-0002RA: 0.45 mv
BSEN-0002RV: 0.9 mv
BSEN-0004RA: 310 ms
BSEN-0005RA: 150 ms
DEV-0004LDO: 4092
DEV-0006PM: 20090107
DEV-0023LDO: 0
DEV-0025PM: 65
EVAL-0015E4: NEGATIVE

## 2010-08-08 NOTE — Progress Notes (Signed)
Device checked in office.

## 2010-08-10 ENCOUNTER — Other Ambulatory Visit: Payer: Self-pay | Admitting: Internal Medicine

## 2010-08-10 DIAGNOSIS — Z1231 Encounter for screening mammogram for malignant neoplasm of breast: Secondary | ICD-10-CM

## 2010-08-15 ENCOUNTER — Telehealth: Payer: Self-pay | Admitting: Cardiology

## 2010-08-15 MED ORDER — METOPROLOL TARTRATE 100 MG PO TABS: 50.0000 mg | ORAL_TABLET | Freq: Two times a day (BID) | ORAL | Status: DC

## 2010-08-15 NOTE — Telephone Encounter (Signed)
RN s/w Pt re: Rx for metoprolol. Rx sent to [facility]. Pt will call in about 1 hour to check on medication. Pt verbalizes understanding.

## 2010-08-15 NOTE — Telephone Encounter (Signed)
Left a message to call back.

## 2010-08-15 NOTE — Telephone Encounter (Signed)
Refill needed for Metoprolol 100mg   [facility]

## 2010-08-25 ENCOUNTER — Ambulatory Visit (INDEPENDENT_AMBULATORY_CARE_PROVIDER_SITE_OTHER): Payer: Medicare Other | Admitting: *Deleted

## 2010-08-25 DIAGNOSIS — I4891 Unspecified atrial fibrillation: Secondary | ICD-10-CM

## 2010-08-25 LAB — POCT INR: INR: 2.5

## 2010-09-06 ENCOUNTER — Other Ambulatory Visit: Payer: Self-pay

## 2010-09-06 MED ORDER — WARFARIN SODIUM 2 MG PO TABS: ORAL_TABLET | ORAL | Status: DC

## 2010-09-08 ENCOUNTER — Ambulatory Visit: Payer: Medicare Other | Admitting: Cardiology

## 2010-09-19 ENCOUNTER — Ambulatory Visit
Admission: RE | Admit: 2010-09-19 | Discharge: 2010-09-19 | Disposition: A | Discharge status: Home / Self Care | Payer: Medicare Other | Source: Ambulatory Visit | Attending: Internal Medicine | Admitting: Internal Medicine

## 2010-09-19 DIAGNOSIS — Z1231 Encounter for screening mammogram for malignant neoplasm of breast: Secondary | ICD-10-CM

## 2010-09-22 ENCOUNTER — Encounter: Payer: Medicare Other | Admitting: *Deleted

## 2010-09-23 ENCOUNTER — Ambulatory Visit (INDEPENDENT_AMBULATORY_CARE_PROVIDER_SITE_OTHER): Payer: Medicare Other | Admitting: *Deleted

## 2010-09-23 DIAGNOSIS — I4891 Unspecified atrial fibrillation: Secondary | ICD-10-CM

## 2010-09-23 LAB — POCT INR
INR: 1.4
INR: 2.1

## 2010-10-10 ENCOUNTER — Other Ambulatory Visit: Payer: Self-pay | Admitting: Cardiology

## 2010-10-12 LAB — COMPREHENSIVE METABOLIC PANEL WITH GFR
ALT: 18
AST: 16
Albumin: 3.9
Alkaline Phosphatase: 71
BUN: 22
CO2: 26
Calcium: 8.7
Chloride: 102
Creatinine, Ser: 1.76 — ABNORMAL HIGH
GFR calc non Af Amer: 28 — ABNORMAL LOW
Glucose, Bld: 127 — ABNORMAL HIGH
Potassium: 3.6
Sodium: 136
Total Bilirubin: 0.5
Total Protein: 7.5

## 2010-10-12 LAB — PROTIME-INR
INR: 1
INR: 1.1
Prothrombin Time: 13.2
Prothrombin Time: 14.4

## 2010-10-12 LAB — BASIC METABOLIC PANEL
BUN: 23
CO2: 23
Calcium: 8.4
Chloride: 107
Creatinine, Ser: 1.52 — ABNORMAL HIGH
GFR calc Af Amer: 40 — ABNORMAL LOW
Glucose, Bld: 88

## 2010-10-12 LAB — CBC
MCHC: 34.1
MCHC: 34.4
MCHC: 34.9
MCV: 95.4
MCV: 96.8
MCV: 97.6
Platelets: 197
Platelets: 254
RBC: 3.44 — ABNORMAL LOW
RBC: 4.11
RDW: 13.8
RDW: 14
RDW: 14.3

## 2010-10-12 LAB — TSH: TSH: 2.124

## 2010-10-12 LAB — DIFFERENTIAL
Basophils Relative: 1
Eosinophils Absolute: 0.1
Lymphs Abs: 1.4
Neutro Abs: 4.6
Neutrophils Relative %: 68

## 2010-10-12 LAB — MAGNESIUM: Magnesium: 2

## 2010-10-12 LAB — HEPARIN LEVEL (UNFRACTIONATED)
Heparin Unfractionated: 0.28 — ABNORMAL LOW
Heparin Unfractionated: 0.83 — ABNORMAL HIGH

## 2010-10-12 LAB — CK TOTAL AND CKMB (NOT AT ARMC)
CK, MB: 0.8
CK, MB: 1.1
Relative Index: INVALID
Relative Index: INVALID
Total CK: 49
Total CK: 56
Total CK: 67

## 2010-10-12 LAB — TROPONIN I: Troponin I: 0.01

## 2010-10-21 ENCOUNTER — Encounter: Payer: BC Managed Care – PPO | Admitting: *Deleted

## 2010-10-27 ENCOUNTER — Ambulatory Visit (INDEPENDENT_AMBULATORY_CARE_PROVIDER_SITE_OTHER): Payer: Medicare Other | Admitting: *Deleted

## 2010-10-27 DIAGNOSIS — I4891 Unspecified atrial fibrillation: Secondary | ICD-10-CM

## 2010-10-27 LAB — POCT INR: INR: 2.8

## 2010-11-10 ENCOUNTER — Telehealth: Payer: Self-pay | Admitting: *Deleted

## 2010-11-10 MED ORDER — METOPROLOL TARTRATE 100 MG PO TABS: ORAL_TABLET | ORAL | Status: DC

## 2010-11-10 NOTE — Telephone Encounter (Signed)
Received fax from pharm requesting dosage on lopressor. Spoke with pt, her primary md increased her lopressor to 100 mg in the am and 1/2 tablet or 50 mg in the pm. New script sent to [facility]

## 2010-11-16 ENCOUNTER — Other Ambulatory Visit: Payer: Self-pay

## 2010-11-16 ENCOUNTER — Encounter: Payer: Self-pay | Admitting: Cardiology

## 2010-11-16 ENCOUNTER — Encounter: Payer: Self-pay | Admitting: Internal Medicine

## 2010-11-16 ENCOUNTER — Encounter: Payer: Self-pay | Admitting: Physician Assistant

## 2010-11-16 ENCOUNTER — Ambulatory Visit (INDEPENDENT_AMBULATORY_CARE_PROVIDER_SITE_OTHER): Payer: Medicare Other | Admitting: *Deleted

## 2010-11-16 ENCOUNTER — Ambulatory Visit (INDEPENDENT_AMBULATORY_CARE_PROVIDER_SITE_OTHER): Payer: Medicare Other | Admitting: Physician Assistant

## 2010-11-16 DIAGNOSIS — I10 Essential (primary) hypertension: Secondary | ICD-10-CM

## 2010-11-16 DIAGNOSIS — I2510 Atherosclerotic heart disease of native coronary artery without angina pectoris: Secondary | ICD-10-CM

## 2010-11-16 DIAGNOSIS — N259 Disorder resulting from impaired renal tubular function, unspecified: Secondary | ICD-10-CM

## 2010-11-16 DIAGNOSIS — R0989 Other specified symptoms and signs involving the circulatory and respiratory systems: Secondary | ICD-10-CM

## 2010-11-16 DIAGNOSIS — I498 Other specified cardiac arrhythmias: Secondary | ICD-10-CM

## 2010-11-16 DIAGNOSIS — R002 Palpitations: Secondary | ICD-10-CM

## 2010-11-16 DIAGNOSIS — I4891 Unspecified atrial fibrillation: Secondary | ICD-10-CM

## 2010-11-16 DIAGNOSIS — R0602 Shortness of breath: Secondary | ICD-10-CM

## 2010-11-16 DIAGNOSIS — R0600 Dyspnea, unspecified: Secondary | ICD-10-CM

## 2010-11-16 DIAGNOSIS — R0609 Other forms of dyspnea: Secondary | ICD-10-CM

## 2010-11-16 LAB — CBC WITH DIFFERENTIAL/PLATELET
Basophils Relative: 0.5 % (ref 0.0–3.0)
Eosinophils Relative: 1 % (ref 0.0–5.0)
HCT: 36.3 % (ref 36.0–46.0)
Lymphs Abs: 1.2 10*3/uL (ref 0.7–4.0)
MCHC: 33.4 g/dL (ref 30.0–36.0)
MCV: 101.6 fl — ABNORMAL HIGH (ref 78.0–100.0)
Monocytes Absolute: 0.5 10*3/uL (ref 0.1–1.0)
Platelets: 167 10*3/uL (ref 150.0–400.0)
RBC: 3.57 Mil/uL — ABNORMAL LOW (ref 3.87–5.11)
WBC: 6.9 10*3/uL (ref 4.5–10.5)

## 2010-11-16 LAB — PACEMAKER DEVICE OBSERVATION
AL IMPEDENCE PM: 472 Ohm
ATRIAL PACING PM: 59.79
BATTERY VOLTAGE: 2.99 V

## 2010-11-16 LAB — BASIC METABOLIC PANEL
CO2: 24 mEq/L (ref 19–32)
Chloride: 106 mEq/L (ref 96–112)
Creatinine, Ser: 1.9 mg/dL — ABNORMAL HIGH (ref 0.4–1.2)
Glucose, Bld: 100 mg/dL — ABNORMAL HIGH (ref 70–99)

## 2010-11-16 MED ORDER — METOPROLOL TARTRATE 100 MG PO TABS: 100.0000 mg | ORAL_TABLET | Freq: Two times a day (BID) | ORAL | Status: DC

## 2010-11-16 NOTE — Assessment & Plan Note (Signed)
Controlled.  

## 2010-11-16 NOTE — Assessment & Plan Note (Signed)
Management per Dr. Darrick Penna.  As noted, K+ recently low and we will check her BMET today.

## 2010-11-16 NOTE — Assessment & Plan Note (Addendum)
I reviewed her ECG with Dr. Johney Frame today.  It is very possible that she is symptomatic from her frequent PVCs.  Increase Lopressor to 100 mg BID.  Check BMET, BNP, TSH, CBC today.  If BNP is high, she will need her lasix adjusted.  Her device was interrogated and she has over 100 PVCs an hour.  Dr. Johney Frame adjusted her pacer and increased the lower rate limit to try and override her PVCs.  We will get an echo.  Follow up in 2-3 weeks with Dr. Jens Som or me.  If PVCs are better, but her dyspnea persists, consider follow up myoview to r/o ischemia.

## 2010-11-16 NOTE — Progress Notes (Signed)
History of Present Illness: Primary Cardiologist: Dr. [name]   Tracey [name] is a 77 y.o. female with a past medical history of paroxysmal atrial fibrillation, status post pacemaker placement due to tachybradycardia syndrome, coronary artery disease with prior PCI of the right coronary artery in 2007 with drug-eluting stents.  Last LHC 5/08: mLAD 50-60%, prox to mid RCA 20-30%, RCA stent 60-70% with thrombus which was tx with POBA.  She had a myoview in November of 2010 that showed an ejection fraction of 57%. There was possible mild anterior ischemia but felt more likely to be probable soft tissue attenuation. She has been treated medically. Echocardiogram performed in April of 2011 showed normal LV function, elevated left ventricular filling pressure , mild to moderate aortic insufficiency, mild mitral regurgitation and mild biatrial enlargement.  He amiodarone has been discontinued secondary to a tremor. She has also been seen by nephrology for renal insufficiency.   She presents today for complaints of weakness, palpitations and dyspnea.  She has felt this way since 2/12.  She really describes class 2b-3 symptoms.  No significant weight gain.  No edema.  No orthopnea or PND.  No syncope.  No syncope.  She does get lightheaded sometimes.  She denies any symptoms like her previous angina.  She does note chest heaviness when she gets short of breath.  Her palpitations feel irregular and fast.  She feels like her palpitations worsened when she started on vitamin D and calcitriol.  She also notes recent hypokalemia and Dr. Darrick Penna adjusted her replacement.    Past Medical History  Diagnosis Date  . Coronary atherosclerosis of unspecified type of vessel, native or graft   . Unspecified essential hypertension   . Other and unspecified hyperlipidemia   . Personal history of other diseases of circulatory system   . Old myocardial infarction   . Personal history of diseases of blood and  blood-forming organs   . Unspecified disorder resulting from impaired renal function   . Esophageal reflux   . Panic disorder without agoraphobia   . Abdominal pain, unspecified site   . Irritable bowel syndrome   . Cerebral aneurysm, nonruptured   . Diverticulosis of colon (without mention of hemorrhage)   . Arthropathy, unspecified, site unspecified   . Incontinence of feces   . Personal history of goiter   . Pacemaker     for sick sinus syndrome/ tachycardia-bradycardia syndrome  . Aortic insufficiency     Current Outpatient Prescriptions  Medication Sig Dispense Refill  . aspirin 81 MG tablet Take 81 mg by mouth daily.        Marland Kitchen atorvastatin (LIPITOR) 40 MG tablet TAKE ONE TABLET BY MOUTH AT BEDTIME  30 tablet  6  . calcitRIOL (ROCALTROL) 0.25 MCG capsule Take 0.25 mcg by mouth daily.        . Cholecalciferol (VITAMIN D) 1000 UNITS capsule Take 1,000 Units by mouth daily.        . diazepam (VALIUM) 5 MG tablet Take 5 mg by mouth daily as needed.        Marland Kitchen estradiol (CLIMARA - DOSED IN MG/24 HR) 0.05 mg/24hr Place 1 patch onto the skin once a week. UAD       . fish oil-omega-3 fatty acids 1000 MG capsule Take 1 g by mouth 2 (two) times daily.       . fluticasone (FLONASE) 50 MCG/ACT nasal spray Place 2 sprays into the nose daily.        . folic  acid (FOLVITE) 1 MG tablet Take 1 mg by mouth 2 (two) times daily.        . furosemide (LASIX) 80 MG tablet Take 80 mg by mouth daily.        . Melatonin 3 MG CAPS Take by mouth at bedtime.        . metoprolol (LOPRESSOR) 100 MG tablet TAKE ONE TABLET EVERY MORNING AND 1/2 TABLET EVERY EVENING  60 tablet  11  . Multiple Vitamin (MULTIVITAMIN) tablet Take 1 tablet by mouth daily.        . multivitamin-lutein (OCUVITE-LUTEIN) CAPS Take 1 capsule by mouth daily.        . pantoprazole (PROTONIX) 40 MG tablet Take 40 mg by mouth daily.        . potassium chloride (KLOR-CON) 10 MEQ CR tablet Take by mouth. Take 2 tablets in the am and one tablet in  the pm       . warfarin (COUMADIN) 2 MG tablet Take as directed by Anticoagulation clinic   60 tablet  3    Allergies: Allergies  Allergen Reactions  . Codeine Phosphate     REACTION: unspecified  . Meperidine Hcl   . Morphine Sulfate     REACTION: unspecified    History  Substance Use Topics  . Smoking status: Never Smoker   . Smokeless tobacco: Not on file  . Alcohol Use: No    ROS:  Please see the history of present illness.  She has lost some weight but is dieting.  All other systems reviewed and negative.   Vital Signs: BP 110/80  Pulse 88  Resp 20  Ht 5\' 4"  (1.626 m)  Wt 148 lb (67.132 kg)  BMI 25.40 kg/m2  PHYSICAL EXAM: Well nourished, well developed, in no acute distress HEENT: normal Neck: JVP 4-5 cm Cardiac:  normal S1, S2; RRR; no murmur Lungs:  clear to auscultation bilaterally, no wheezing, rhonchi or rales Abd: soft, nontender, no hepatomegaly Ext: very trace bilateral edema Skin: warm and dry Neuro:  CNs 2-12 intact, no focal abnormalities noted Psych: normal affect  EKG:  A paced with frequent PVCs and bigeminy, HR 88  ASSESSMENT AND PLAN:

## 2010-11-16 NOTE — Patient Instructions (Addendum)
Your physician recommends that you schedule a follow-up appointment in: 2-3 weeks with Dr Jens Som or Tereso Newcomer, PA-c Your physician recommends that you schedule a follow-up appointment in: 1/13 with Dr Johney Frame Your physician has recommended you make the following change in your medication: INCREASE Metoprolol to 100 mg twice daily Your physician recommends that you return for lab work in: today (BMP, BNP, CBC, TSH) Your physician has requested that you have an echocardiogram. Echocardiography is a painless test that uses sound waves to create images of your heart. It provides your doctor with information about the size and shape of your heart and how well your heart's chambers and valves are working. This procedure takes approximately one hour. There are no restrictions for this procedure.

## 2010-11-16 NOTE — Assessment & Plan Note (Signed)
Continue ASA and statin.  As noted, consider Myoview if adjustments do not improve her dyspnea.

## 2010-11-16 NOTE — Assessment & Plan Note (Signed)
Continue coumadin.  

## 2010-11-16 NOTE — Progress Notes (Signed)
PPM check 

## 2010-11-16 NOTE — Assessment & Plan Note (Addendum)
As noted she was having a large number of PVCs.  Adjust medications and device as noted.  Check labs and echo as noted.  Follow up in 2-3 weeks.

## 2010-11-17 ENCOUNTER — Telehealth: Payer: Self-pay | Admitting: *Deleted

## 2010-11-17 ENCOUNTER — Telehealth: Payer: Self-pay

## 2010-11-17 DIAGNOSIS — I10 Essential (primary) hypertension: Secondary | ICD-10-CM

## 2010-11-17 NOTE — Telephone Encounter (Signed)
Pt was called and told to increase Lasix to 80mg  am and 40mg  pm, increase K+ to bid by Tereso Newcomer, PA's assistant.  While on the phone, pt mentioned that she was having some increased sob, increased heart rate to 136 (irreg.)  and increased BP to 159/84.  She states the sob became worse this am and she gets it when she moves.

## 2010-11-17 NOTE — Telephone Encounter (Signed)
Pt to take increased dose of lasix tonight and call back in the am if symptoms do not improve.  She is to go to the ER if she gets worse.  Instructions per Dr Dicie Beam (DOD).  Pt states she understands.

## 2010-11-17 NOTE — Telephone Encounter (Signed)
Lab order placed for 11/24/10

## 2010-11-19 ENCOUNTER — Emergency Department (HOSPITAL_COMMUNITY): Payer: Medicare Other

## 2010-11-19 ENCOUNTER — Emergency Department (HOSPITAL_COMMUNITY)
Admission: EM | Admit: 2010-11-19 | Discharge: 2010-11-19 | Disposition: A | Discharge status: Home / Self Care | Payer: Medicare Other | Attending: Emergency Medicine | Admitting: Emergency Medicine

## 2010-11-19 DIAGNOSIS — I2510 Atherosclerotic heart disease of native coronary artery without angina pectoris: Secondary | ICD-10-CM | POA: Insufficient documentation

## 2010-11-19 DIAGNOSIS — I10 Essential (primary) hypertension: Secondary | ICD-10-CM | POA: Insufficient documentation

## 2010-11-19 DIAGNOSIS — I4891 Unspecified atrial fibrillation: Secondary | ICD-10-CM | POA: Insufficient documentation

## 2010-11-19 DIAGNOSIS — H3530 Unspecified macular degeneration: Secondary | ICD-10-CM | POA: Insufficient documentation

## 2010-11-19 DIAGNOSIS — K589 Irritable bowel syndrome without diarrhea: Secondary | ICD-10-CM | POA: Insufficient documentation

## 2010-11-19 DIAGNOSIS — K219 Gastro-esophageal reflux disease without esophagitis: Secondary | ICD-10-CM | POA: Insufficient documentation

## 2010-11-19 DIAGNOSIS — E039 Hypothyroidism, unspecified: Secondary | ICD-10-CM | POA: Insufficient documentation

## 2010-11-19 DIAGNOSIS — I509 Heart failure, unspecified: Secondary | ICD-10-CM | POA: Insufficient documentation

## 2010-11-19 DIAGNOSIS — E7800 Pure hypercholesterolemia, unspecified: Secondary | ICD-10-CM | POA: Insufficient documentation

## 2010-11-19 DIAGNOSIS — R002 Palpitations: Secondary | ICD-10-CM | POA: Insufficient documentation

## 2010-11-19 DIAGNOSIS — Z950 Presence of cardiac pacemaker: Secondary | ICD-10-CM | POA: Insufficient documentation

## 2010-11-19 LAB — CBC
HCT: 36.4 % (ref 36.0–46.0)
Hemoglobin: 12.5 g/dL (ref 12.0–15.0)
MCV: 97.8 fL (ref 78.0–100.0)
RBC: 3.72 MIL/uL — ABNORMAL LOW (ref 3.87–5.11)
WBC: 7.8 10*3/uL (ref 4.0–10.5)

## 2010-11-19 LAB — COMPREHENSIVE METABOLIC PANEL
ALT: 31 U/L (ref 0–35)
AST: 18 U/L (ref 0–37)
Albumin: 3.5 g/dL (ref 3.5–5.2)
Alkaline Phosphatase: 70 U/L (ref 39–117)
Chloride: 99 mEq/L (ref 96–112)
Potassium: 3 mEq/L — ABNORMAL LOW (ref 3.5–5.1)
Sodium: 138 mEq/L (ref 135–145)
Total Bilirubin: 0.6 mg/dL (ref 0.3–1.2)
Total Protein: 7 g/dL (ref 6.0–8.3)

## 2010-11-19 LAB — PROTIME-INR: INR: 1.92 — ABNORMAL HIGH (ref 0.00–1.49)

## 2010-11-19 LAB — DIFFERENTIAL
Basophils Absolute: 0 10*3/uL (ref 0.0–0.1)
Lymphocytes Relative: 14 % (ref 12–46)
Lymphs Abs: 1.1 10*3/uL (ref 0.7–4.0)
Neutro Abs: 5.7 10*3/uL (ref 1.7–7.7)
Neutrophils Relative %: 73 % (ref 43–77)

## 2010-11-19 LAB — PRO B NATRIURETIC PEPTIDE: Pro B Natriuretic peptide (BNP): 7866 pg/mL — ABNORMAL HIGH (ref 0–450)

## 2010-11-21 ENCOUNTER — Encounter: Payer: Self-pay | Admitting: Internal Medicine

## 2010-11-21 ENCOUNTER — Telehealth: Payer: Self-pay | Admitting: Cardiology

## 2010-11-21 LAB — PACEMAKER DEVICE OBSERVATION

## 2010-11-21 NOTE — Telephone Encounter (Signed)
Discussed with device nurses, they will call and bring the pt into the office today to check her device Tracey [name]

## 2010-11-21 NOTE — Telephone Encounter (Signed)
Pt calling to speak with nurse. Please return call to discuss further.

## 2010-11-21 NOTE — Telephone Encounter (Signed)
Spoke with pt, she was seen last week and adjustments were made in her device due to the large amount of PVC's she was having. She reports not feeling well on Friday. She checked her bp and her pulse read 30. She had the nurse at [facility] check it and they reported 28 to 35 and called EMS to transport her to [facility]. She states her pulse cont to read low in the ER.(EKG in chart shows rate of 85). She feels fine today but was told to follow up and she is concerned because of what happened. Will discuss with dr Johney Frame [name]

## 2010-11-24 ENCOUNTER — Ambulatory Visit (INDEPENDENT_AMBULATORY_CARE_PROVIDER_SITE_OTHER): Payer: Medicare Other | Admitting: *Deleted

## 2010-11-24 ENCOUNTER — Ambulatory Visit (HOSPITAL_COMMUNITY): Payer: Medicare Other | Attending: Cardiology

## 2010-11-24 ENCOUNTER — Other Ambulatory Visit (INDEPENDENT_AMBULATORY_CARE_PROVIDER_SITE_OTHER): Payer: Medicare Other | Admitting: *Deleted

## 2010-11-24 DIAGNOSIS — R0989 Other specified symptoms and signs involving the circulatory and respiratory systems: Secondary | ICD-10-CM

## 2010-11-24 DIAGNOSIS — R0600 Dyspnea, unspecified: Secondary | ICD-10-CM

## 2010-11-24 DIAGNOSIS — R0609 Other forms of dyspnea: Secondary | ICD-10-CM | POA: Insufficient documentation

## 2010-11-24 DIAGNOSIS — I10 Essential (primary) hypertension: Secondary | ICD-10-CM | POA: Insufficient documentation

## 2010-11-24 DIAGNOSIS — R002 Palpitations: Secondary | ICD-10-CM | POA: Insufficient documentation

## 2010-11-24 DIAGNOSIS — Z7901 Long term (current) use of anticoagulants: Secondary | ICD-10-CM

## 2010-11-24 DIAGNOSIS — I4891 Unspecified atrial fibrillation: Secondary | ICD-10-CM

## 2010-11-24 DIAGNOSIS — I319 Disease of pericardium, unspecified: Secondary | ICD-10-CM | POA: Insufficient documentation

## 2010-11-24 DIAGNOSIS — I252 Old myocardial infarction: Secondary | ICD-10-CM | POA: Insufficient documentation

## 2010-11-24 DIAGNOSIS — I2510 Atherosclerotic heart disease of native coronary artery without angina pectoris: Secondary | ICD-10-CM | POA: Insufficient documentation

## 2010-11-24 DIAGNOSIS — I080 Rheumatic disorders of both mitral and aortic valves: Secondary | ICD-10-CM | POA: Insufficient documentation

## 2010-11-24 DIAGNOSIS — I079 Rheumatic tricuspid valve disease, unspecified: Secondary | ICD-10-CM | POA: Insufficient documentation

## 2010-11-24 LAB — BRAIN NATRIURETIC PEPTIDE: Pro B Natriuretic peptide (BNP): 614 pg/mL — ABNORMAL HIGH (ref 0.0–100.0)

## 2010-11-24 LAB — BASIC METABOLIC PANEL
BUN: 42 mg/dL — ABNORMAL HIGH (ref 6–23)
Calcium: 9.1 mg/dL (ref 8.4–10.5)
Chloride: 101 mEq/L (ref 96–112)
Creatinine, Ser: 2.1 mg/dL — ABNORMAL HIGH (ref 0.4–1.2)

## 2010-11-24 LAB — POCT INR: INR: 2.4

## 2010-11-25 ENCOUNTER — Telehealth: Payer: Self-pay | Admitting: *Deleted

## 2010-11-25 ENCOUNTER — Other Ambulatory Visit: Payer: Self-pay | Admitting: *Deleted

## 2010-11-25 DIAGNOSIS — I10 Essential (primary) hypertension: Secondary | ICD-10-CM

## 2010-11-25 NOTE — Telephone Encounter (Signed)
Lab order bmet for 12/02/10

## 2010-11-25 NOTE — Telephone Encounter (Signed)
Med refill

## 2010-12-02 ENCOUNTER — Ambulatory Visit (INDEPENDENT_AMBULATORY_CARE_PROVIDER_SITE_OTHER): Payer: Medicare Other | Admitting: *Deleted

## 2010-12-02 DIAGNOSIS — I10 Essential (primary) hypertension: Secondary | ICD-10-CM

## 2010-12-02 DIAGNOSIS — Z8639 Personal history of other endocrine, nutritional and metabolic disease: Secondary | ICD-10-CM

## 2010-12-02 LAB — BASIC METABOLIC PANEL
BUN: 38 mg/dL — ABNORMAL HIGH (ref 6–23)
Calcium: 8.6 mg/dL (ref 8.4–10.5)
GFR: 23.22 mL/min — ABNORMAL LOW (ref >60.00)
Glucose, Bld: 103 mg/dL — ABNORMAL HIGH (ref 70–99)
Sodium: 137 mEq/L (ref 135–145)

## 2010-12-08 ENCOUNTER — Encounter: Payer: Self-pay | Admitting: Cardiology

## 2010-12-08 ENCOUNTER — Ambulatory Visit (INDEPENDENT_AMBULATORY_CARE_PROVIDER_SITE_OTHER): Payer: Medicare Other | Admitting: Cardiology

## 2010-12-08 ENCOUNTER — Other Ambulatory Visit (INDEPENDENT_AMBULATORY_CARE_PROVIDER_SITE_OTHER): Payer: Medicare Other | Admitting: *Deleted

## 2010-12-08 VITALS — BP 120/70 | HR 80 | Ht 69.0 in | Wt 153.8 lb

## 2010-12-08 DIAGNOSIS — I10 Essential (primary) hypertension: Secondary | ICD-10-CM

## 2010-12-08 DIAGNOSIS — I4891 Unspecified atrial fibrillation: Secondary | ICD-10-CM

## 2010-12-08 DIAGNOSIS — I2510 Atherosclerotic heart disease of native coronary artery without angina pectoris: Secondary | ICD-10-CM

## 2010-12-08 DIAGNOSIS — Z862 Personal history of diseases of the blood and blood-forming organs and certain disorders involving the immune mechanism: Secondary | ICD-10-CM

## 2010-12-08 DIAGNOSIS — Z8639 Personal history of other endocrine, nutritional and metabolic disease: Secondary | ICD-10-CM

## 2010-12-08 DIAGNOSIS — E785 Hyperlipidemia, unspecified: Secondary | ICD-10-CM

## 2010-12-08 DIAGNOSIS — H31019 Macula scars of posterior pole (postinflammatory) (post-traumatic), unspecified eye: Secondary | ICD-10-CM | POA: Insufficient documentation

## 2010-12-08 DIAGNOSIS — H353233 Exudative age-related macular degeneration, bilateral, with inactive scar: Secondary | ICD-10-CM | POA: Insufficient documentation

## 2010-12-08 DIAGNOSIS — R0602 Shortness of breath: Secondary | ICD-10-CM

## 2010-12-08 MED ORDER — FUROSEMIDE 80 MG PO TABS: 80.0000 mg | ORAL_TABLET | Freq: Two times a day (BID) | ORAL | Status: DC

## 2010-12-08 NOTE — Assessment & Plan Note (Addendum)
Patient will continue on beta blocker and Coumadin. She is complaining of palpitations when she lies flat. We will interrogate her pacemaker today to look for tachycardia. Her recent TSH was low. Repeat TSH, free T3, free T4 today. Certainly hyperthyroidism could be contributing to her symptoms.   Patient's pacemaker was interrogated and she remains in atrial fibrillation. This certainly could be contributing to her volume excess. However she did not tolerate amiodarone. Her renal function will not allow tikosyn. Given coronary disease she is not a candidate for a class IC agent. Plan rate control and anticoagulation. Her rate appears to be reasonable based on interrogation of device.

## 2010-12-08 NOTE — Assessment & Plan Note (Signed)
Followed by nephrology. 

## 2010-12-08 NOTE — Patient Instructions (Signed)
Your physician recommends that you schedule a follow-up appointment in: 4 weeks  Your physician recommends that you return for lab work in: today and 1 week  INCREASE FUROSEMIDE 80 MGS TWICE DAILY

## 2010-12-08 NOTE — Assessment & Plan Note (Signed)
Is volume overloaded on examination. Check potassium, renal function and BNP. Increase Lasix to 80 mg b.i.d. Recheck potassium and renal function in one week.

## 2010-12-08 NOTE — Progress Notes (Signed)
Tracey [name]:[id number] female with a past medical history of paroxysmal atrial fibrillation, status post pacemaker placement due to tachybradycardia syndrome, coronary artery disease with prior PCI of the right coronary artery in 2007 with drug-eluting stents. A myoview in November of 2010 showed an ejection fraction of 57%. There was possible mild anterior ischemia but felt more likely to be probable soft tissue attenuation. We are treating medically. We previously discontinued her amiodarone secondary to a tremor. She was seen by Tereso Newcomer in Oct of 2012 for increased dyspnea. Echocardiogram in November of 2012 showed an ejection fraction of 50-55%, mild aortic insufficiency, moderate mitral regurgitation, moderate to severe tricuspid regurgitation, moderate biatrial enlargement. There was a small pericardial effusion. BNP was elevated and TSH was low. Her Lasix was increased. Her beta blocker was also increased for frequent PVCs. Since that time, she complains of dyspnea with exertion relieved with rest. There is no orthopnea, PND or syncope. She does have mild pedal edema which is unchanged. She complains of palpitations when lying flat.   Current Outpatient Prescriptions  Medication Sig Dispense Refill  . aspirin 81 MG tablet Take 81 mg by mouth daily.        Marland Kitchen atorvastatin (LIPITOR) 40 MG tablet TAKE ONE TABLET BY MOUTH AT BEDTIME  30 tablet  6  . diazepam (VALIUM) 5 MG tablet Take 5 mg by mouth daily as needed.        Marland Kitchen estradiol (CLIMARA - DOSED IN MG/24 HR) 0.05 mg/24hr Place 1 patch onto the skin once a week. UAD       . fluticasone (FLONASE) 50 MCG/ACT nasal spray Place 2 sprays into the nose daily.        . furosemide (LASIX) 80 MG tablet Take 80 in the AM and take 40 in the PM      . metoprolol (LOPRESSOR) 100 MG tablet Take 1 tablet (100 mg total) by mouth 2 (two) times daily.  60 tablet  6  . multivitamin-lutein (OCUVITE-LUTEIN) CAPS Take 1 capsule by mouth daily.        . pantoprazole  (PROTONIX) 40 MG tablet Take 40 mg by mouth daily.        . potassium chloride (KLOR-CON) 10 MEQ CR tablet Take 3 tablets twice daily      . Probiotic Product (RESTORA PO) Take by mouth.        . warfarin (COUMADIN) 2 MG tablet Take as directed by Anticoagulation clinic   60 tablet  3     Past Medical History  Diagnosis Date  . Coronary atherosclerosis of unspecified type of vessel, native or graft   . Unspecified essential hypertension   . Other and unspecified hyperlipidemia   . Personal history of other diseases of circulatory system   . Old myocardial infarction   . Personal history of diseases of blood and blood-forming organs   . Unspecified disorder resulting from impaired renal function   . Esophageal reflux   . Panic disorder without agoraphobia   . Abdominal pain, unspecified site   . Irritable bowel syndrome   . Cerebral aneurysm, nonruptured   . Diverticulosis of colon (without mention of hemorrhage)   . Arthropathy, unspecified, site unspecified   . Incontinence of feces   . Personal history of goiter   . Pacemaker     for sick sinus syndrome/ tachycardia-bradycardia syndrome  . Aortic insufficiency   . Paroxysmal atrial fibrillation   . Anemia, iron deficiency   . Hyperlipemia   . GERD (gastroesophageal reflux  disease)   . Arthritis   . Sick sinus syndrome   . Tachycardia-bradycardia syndrome   . H/O: hysterectomy     Past Surgical History  Procedure Date  . Cholecystectomy 2000  . Vesicovaginal fistula closure w/ tah   . Surgery for subdural hematoma   . Tonsillectomy   . Appendectomy   . Ppm implant     by Dr. Jenne Campus 1/09  . H/o pacemaker     History   Social History  . Marital Status: Widowed    Spouse Name: N/A    Number of Children: 1  . Years of Education: N/A   Occupational History  . Beautician    Social History Main Topics  . Smoking status: Never Smoker   . Smokeless tobacco: Not on file  . Alcohol Use: No  . Drug Use: Not on  file  . Sexually Active: Not on file   Other Topics Concern  . Not on file   Social History Narrative  . No narrative on file    ROS: no fevers or chills, productive cough, hemoptysis, dysphasia, odynophagia, melena, hematochezia, dysuria, hematuria, rash, seizure activity, orthopnea, PND, pedal edema, claudication. Remaining systems are negative.  Physical Exam: Well-developed well-nourished in no acute distress.  Skin is warm and dry.  HEENT is normal.  Neck is supple. No thyromegaly.  Chest is clear to auscultation with normal expansion. Cardiovascular exam is irregular Abdominal exam nontender or distended. No masses palpated. Extremities show 1+ edema. neuro grossly intact

## 2010-12-08 NOTE — Assessment & Plan Note (Signed)
Blood pressure controlled. Continue present medications. 

## 2010-12-08 NOTE — Assessment & Plan Note (Signed)
Continue statin. 

## 2010-12-08 NOTE — Assessment & Plan Note (Signed)
Management per electrophysiology. 

## 2010-12-08 NOTE — Assessment & Plan Note (Signed)
-   Continue beta-blocker and statin 

## 2010-12-09 LAB — BASIC METABOLIC PANEL
CO2: 22 mEq/L (ref 19–32)
Calcium: 7.9 mg/dL — ABNORMAL LOW (ref 8.4–10.5)
Creatinine, Ser: 2.2 mg/dL — ABNORMAL HIGH (ref 0.4–1.2)
GFR: 22.97 mL/min — ABNORMAL LOW (ref >60.00)
Glucose, Bld: 91 mg/dL (ref 70–99)

## 2010-12-09 LAB — T3, FREE: T3, Free: 3 pg/mL (ref 2.3–4.2)

## 2010-12-09 LAB — TSH: TSH: 0.02 u[IU]/mL — ABNORMAL LOW (ref 0.35–5.50)

## 2010-12-19 ENCOUNTER — Other Ambulatory Visit: Payer: Self-pay | Admitting: *Deleted

## 2010-12-19 ENCOUNTER — Ambulatory Visit (INDEPENDENT_AMBULATORY_CARE_PROVIDER_SITE_OTHER): Payer: Medicare Other | Admitting: *Deleted

## 2010-12-19 DIAGNOSIS — N289 Disorder of kidney and ureter, unspecified: Secondary | ICD-10-CM

## 2010-12-19 LAB — BASIC METABOLIC PANEL
BUN: 30 mg/dL — ABNORMAL HIGH (ref 6–23)
Creatinine, Ser: 2 mg/dL — ABNORMAL HIGH (ref 0.4–1.2)
GFR: 24.54 mL/min — ABNORMAL LOW (ref >60.00)

## 2010-12-20 ENCOUNTER — Telehealth: Payer: Self-pay | Admitting: *Deleted

## 2010-12-20 DIAGNOSIS — E876 Hypokalemia: Secondary | ICD-10-CM

## 2010-12-20 MED ORDER — POTASSIUM CHLORIDE CRYS ER 20 MEQ PO TBCR: EXTENDED_RELEASE_TABLET | ORAL | Status: DC

## 2010-12-20 NOTE — Telephone Encounter (Signed)
Message copied by Freddi Starr on Tue Dec 20, 2010 10:59 AM ------      Message from: Lewayne Bunting      Created: Mon Dec 19, 2010  5:34 PM       Change kcl to 40 meq bid; bmet one week      [name]

## 2010-12-26 ENCOUNTER — Telehealth: Payer: Self-pay | Admitting: Cardiology

## 2010-12-26 NOTE — Telephone Encounter (Signed)
Spoke with pt, made aware office notes and labs have been sent to dr Juleen China. Pt given the number to their office. She will call back here if further assistance needed Tracey [name]

## 2010-12-26 NOTE — Telephone Encounter (Signed)
New problem She called [facility] She said they have no records please send again

## 2010-12-26 NOTE — Telephone Encounter (Signed)
FU Call: Pt calling regarding pt thyroid referral. Pt thought someone was suppose to call her to set up appt, over thanksgiving week, however pt hasn't heard from them. Pt wants to know if she needs to call them. Please return pt call to discuss further.

## 2010-12-26 NOTE — Telephone Encounter (Signed)
Pt made aware records faxed to [phone] [name]

## 2010-12-27 ENCOUNTER — Other Ambulatory Visit: Payer: Medicare Other | Admitting: *Deleted

## 2010-12-28 ENCOUNTER — Other Ambulatory Visit (INDEPENDENT_AMBULATORY_CARE_PROVIDER_SITE_OTHER): Payer: Medicare Other | Admitting: *Deleted

## 2010-12-28 DIAGNOSIS — E876 Hypokalemia: Secondary | ICD-10-CM

## 2010-12-28 LAB — BASIC METABOLIC PANEL
Calcium: 8.6 mg/dL (ref 8.4–10.5)
GFR: 26.8 mL/min — ABNORMAL LOW (ref >60.00)
Sodium: 138 mEq/L (ref 135–145)

## 2011-01-05 ENCOUNTER — Encounter: Payer: Medicare Other | Admitting: *Deleted

## 2011-01-05 ENCOUNTER — Ambulatory Visit (INDEPENDENT_AMBULATORY_CARE_PROVIDER_SITE_OTHER): Payer: Medicare Other | Admitting: *Deleted

## 2011-01-05 ENCOUNTER — Encounter: Payer: Self-pay | Admitting: Cardiology

## 2011-01-05 ENCOUNTER — Ambulatory Visit (INDEPENDENT_AMBULATORY_CARE_PROVIDER_SITE_OTHER): Payer: Medicare Other | Admitting: Cardiology

## 2011-01-05 VITALS — BP 128/72 | HR 72 | Ht 64.0 in | Wt 146.0 lb

## 2011-01-05 DIAGNOSIS — I10 Essential (primary) hypertension: Secondary | ICD-10-CM

## 2011-01-05 DIAGNOSIS — I4891 Unspecified atrial fibrillation: Secondary | ICD-10-CM

## 2011-01-05 DIAGNOSIS — I2510 Atherosclerotic heart disease of native coronary artery without angina pectoris: Secondary | ICD-10-CM

## 2011-01-05 DIAGNOSIS — E0590 Thyrotoxicosis, unspecified without thyrotoxic crisis or storm: Secondary | ICD-10-CM | POA: Insufficient documentation

## 2011-01-05 DIAGNOSIS — Z7901 Long term (current) use of anticoagulants: Secondary | ICD-10-CM

## 2011-01-05 DIAGNOSIS — N259 Disorder resulting from impaired renal tubular function, unspecified: Secondary | ICD-10-CM

## 2011-01-05 DIAGNOSIS — Z950 Presence of cardiac pacemaker: Secondary | ICD-10-CM

## 2011-01-05 DIAGNOSIS — E785 Hyperlipidemia, unspecified: Secondary | ICD-10-CM

## 2011-01-05 DIAGNOSIS — R0602 Shortness of breath: Secondary | ICD-10-CM

## 2011-01-05 LAB — POCT INR: INR: 2.1

## 2011-01-05 NOTE — Assessment & Plan Note (Signed)
Followed by nephrology. 

## 2011-01-05 NOTE — Assessment & Plan Note (Signed)
Patient has been referred to endocrinology.

## 2011-01-05 NOTE — Assessment & Plan Note (Signed)
Management per electrophysiology. 

## 2011-01-05 NOTE — Assessment & Plan Note (Signed)
Discontinue aspirin and Coumadin use. Continue statin.

## 2011-01-05 NOTE — Progress Notes (Signed)
WUJ:[id number] female with a past medical history of paroxysmal atrial fibrillation, status post pacemaker placement due to tachybradycardia syndrome, coronary artery disease with prior PCI of the right coronary artery in 2007 with drug-eluting stents. A myoview in November of 2010 showed an ejection fraction of 57%. There was possible mild anterior ischemia but felt more likely to be probable soft tissue attenuation. We are treating medically. We previously discontinued her amiodarone secondary to a tremor. She was seen by Tereso Newcomer in Oct of 2012 for increased dyspnea. Echocardiogram in November of 2012 showed an ejection fraction of 50-55%, mild aortic insufficiency, moderate mitral regurgitation, moderate to severe tricuspid regurgitation, moderate biatrial enlargement. There was a small pericardial effusion. BNP was elevated and TSH was low. Her Lasix was increased. Her beta blocker was also increased for frequent PVCs. I last saw her and increased her lasix to 80 mg po bid. Since then, she has DOE but improved; pedal edma has improved; no chest pain; palpitations occasionally at night.   Current Outpatient Prescriptions  Medication Sig Dispense Refill  . aspirin 81 MG tablet Take 81 mg by mouth daily.        Marland Kitchen atorvastatin (LIPITOR) 40 MG tablet TAKE ONE TABLET BY MOUTH AT BEDTIME  30 tablet  6  . diazepam (VALIUM) 5 MG tablet Take 5 mg by mouth daily as needed.        Marland Kitchen estradiol (CLIMARA - DOSED IN MG/24 HR) 0.05 mg/24hr Place 1 patch onto the skin once a week. UAD       . fluticasone (FLONASE) 50 MCG/ACT nasal spray Place 2 sprays into the nose daily.        . furosemide (LASIX) 80 MG tablet Take 1 tablet (80 mg total) by mouth 2 (two) times daily.  60 tablet  12  . metoprolol (LOPRESSOR) 100 MG tablet Take 1 tablet (100 mg total) by mouth 2 (two) times daily.  60 tablet  6  . multivitamin-lutein (OCUVITE-LUTEIN) CAPS Take 1 capsule by mouth daily.        . pantoprazole (PROTONIX) 40 MG  tablet Take 40 mg by mouth daily.        . potassium chloride SA (K-DUR,KLOR-CON) 20 MEQ tablet Take two tablets twice daily  120 tablet  12  . Probiotic Product (RESTORA PO) Take by mouth.        . warfarin (COUMADIN) 2 MG tablet Take as directed by Anticoagulation clinic   60 tablet  3     Past Medical History  Diagnosis Date  . Coronary atherosclerosis of unspecified type of vessel, native or graft   . Unspecified essential hypertension   . Other and unspecified hyperlipidemia   . Personal history of other diseases of circulatory system   . Old myocardial infarction   . Personal history of diseases of blood and blood-forming organs   . Unspecified disorder resulting from impaired renal function   . Esophageal reflux   . Panic disorder without agoraphobia   . Abdominal pain, unspecified site   . Irritable bowel syndrome   . Cerebral aneurysm, nonruptured   . Diverticulosis of colon (without mention of hemorrhage)   . Arthropathy, unspecified, site unspecified   . Incontinence of feces   . Personal history of goiter   . Pacemaker     for sick sinus syndrome/ tachycardia-bradycardia syndrome  . Aortic insufficiency   . Paroxysmal atrial fibrillation   . Anemia, iron deficiency   . Hyperlipemia   . GERD (gastroesophageal reflux  disease)   . Arthritis   . Sick sinus syndrome   . Tachycardia-bradycardia syndrome   . H/O: hysterectomy     Past Surgical History  Procedure Date  . Cholecystectomy 2000  . Vesicovaginal fistula closure w/ tah   . Surgery for subdural hematoma   . Tonsillectomy   . Appendectomy   . Ppm implant     by Dr. Jenne Campus 1/09  . H/o pacemaker     History   Social History  . Marital Status: Widowed    Spouse Name: N/A    Number of Children: 1  . Years of Education: N/A   Occupational History  . Beautician    Social History Main Topics  . Smoking status: Never Smoker   . Smokeless tobacco: Not on file  . Alcohol Use: No  . Drug Use: Not  on file  . Sexually Active: Not on file   Other Topics Concern  . Not on file   Social History Narrative  . No narrative on file    ROS: fatigue but no fevers or chills, productive cough, hemoptysis, dysphasia, odynophagia, melena, hematochezia, dysuria, hematuria, rash, seizure activity, orthopnea, PND,  claudication. Remaining systems are negative.  Physical Exam: Well-developed well-nourished in no acute distress.  Skin is warm and dry.  HEENT is normal.  Neck is supple. No thyromegaly.  Chest is clear to auscultation with normal expansion.  Cardiovascular exam is irregular Abdominal exam nontender or distended. No masses palpated. Extremities show trace edema. neuro grossly intact  ECG atrial fibrillation with occasional ventricular pacing, nonspecific ST changes, septal MI, IRBBB

## 2011-01-05 NOTE — Assessment & Plan Note (Signed)
Continue statin. 

## 2011-01-05 NOTE — Assessment & Plan Note (Signed)
I will continue beta blocker and Coumadin. Discontinue aspirin.

## 2011-01-05 NOTE — Assessment & Plan Note (Signed)
Blood pressure is controlled. Continue present medication

## 2011-01-05 NOTE — Patient Instructions (Signed)
Your physician recommends that you schedule a follow-up appointment in: 3 MONTHS  STOP ASPIRIN  TAKE EXTRA 1/2 LASIX ONCE DAILY AS NEEDED FOR SWELLING

## 2011-01-05 NOTE — Assessment & Plan Note (Signed)
Patient's dyspnea and volume excess have improved. She does have some continued dyspnea on exertion. She would prefer not to increase diuretics if possible. I have asked her to take an additional 40 mg of Lasix daily as needed.

## 2011-01-11 ENCOUNTER — Other Ambulatory Visit (HOSPITAL_COMMUNITY): Payer: Self-pay | Admitting: Internal Medicine

## 2011-01-11 DIAGNOSIS — E0590 Thyrotoxicosis, unspecified without thyrotoxic crisis or storm: Secondary | ICD-10-CM

## 2011-01-26 ENCOUNTER — Encounter (HOSPITAL_COMMUNITY)
Admission: RE | Admit: 2011-01-26 | Discharge: 2011-01-26 | Disposition: A | Discharge status: Home / Self Care | Payer: Medicare Other | Source: Ambulatory Visit | Attending: Internal Medicine | Admitting: Internal Medicine

## 2011-01-26 DIAGNOSIS — R946 Abnormal results of thyroid function studies: Secondary | ICD-10-CM | POA: Insufficient documentation

## 2011-01-26 DIAGNOSIS — E0590 Thyrotoxicosis, unspecified without thyrotoxic crisis or storm: Secondary | ICD-10-CM

## 2011-01-26 MED ORDER — SODIUM IODIDE I 131 CAPSULE
8.6000 | Freq: Once | INTRAVENOUS | Status: AC | PRN
  Administered 2011-01-26: 8.6 via ORAL

## 2011-01-27 ENCOUNTER — Encounter (HOSPITAL_COMMUNITY)
Admission: RE | Admit: 2011-01-27 | Discharge: 2011-01-27 | Disposition: A | Discharge status: Home / Self Care | Payer: Medicare Other | Source: Ambulatory Visit | Attending: Internal Medicine | Admitting: Internal Medicine

## 2011-01-27 MED ORDER — SODIUM IODIDE I 131 CAPSULE: 8.6000 | Freq: Once | INTRAVENOUS | Status: AC | PRN

## 2011-01-27 MED ORDER — SODIUM PERTECHNETATE TC 99M INJECTION
10.0000 | Freq: Once | INTRAVENOUS | Status: AC | PRN
  Administered 2011-01-27: 10 via INTRAVENOUS

## 2011-02-01 ENCOUNTER — Ambulatory Visit (INDEPENDENT_AMBULATORY_CARE_PROVIDER_SITE_OTHER): Payer: Medicare Other | Admitting: Internal Medicine

## 2011-02-01 ENCOUNTER — Encounter: Payer: Self-pay | Admitting: Internal Medicine

## 2011-02-01 VITALS — BP 118/67 | HR 65 | Ht 64.0 in | Wt 144.8 lb

## 2011-02-01 DIAGNOSIS — I495 Sick sinus syndrome: Secondary | ICD-10-CM

## 2011-02-01 DIAGNOSIS — I499 Cardiac arrhythmia, unspecified: Secondary | ICD-10-CM

## 2011-02-01 DIAGNOSIS — I4891 Unspecified atrial fibrillation: Secondary | ICD-10-CM

## 2011-02-01 LAB — PACEMAKER DEVICE OBSERVATION
AL AMPLITUDE: 2.414 mv
RV LEAD AMPLITUDE: 14.2195 mv
RV LEAD IMPEDENCE PM: 384 Ohm
RV LEAD THRESHOLD: 0.5 V

## 2011-02-01 NOTE — Assessment & Plan Note (Signed)
Normal pacemaker function See Pace Art report No changes today  

## 2011-02-01 NOTE — Patient Instructions (Signed)
Your physician wants you to follow-up in:  6 months. You will receive a reminder letter in the mail two months in advance. If you don't receive a letter, please call our office to schedule the follow-up appointment.   

## 2011-02-01 NOTE — Progress Notes (Signed)
PCP:  [name], MD, MD  The patient presents today for routine electrophysiology followup.  She has an ongoing workup for hyperthyroidism.  She feels that her SOB and weakness are improving.  She continues to have nocturnal palpitations.  Today, she denies symptoms of chest pain,  dizziness, presyncope, syncope, or neurologic sequela.  She has stable mild edema. The patient feels that she is tolerating medications without difficulties and is otherwise without complaint today.   Past Medical History  Diagnosis Date  . Coronary atherosclerosis of unspecified type of vessel, native or graft   . Unspecified essential hypertension   . Other and unspecified hyperlipidemia   . Personal history of other diseases of circulatory system   . Old myocardial infarction   . Personal history of diseases of blood and blood-forming organs   . Unspecified disorder resulting from impaired renal function   . Esophageal reflux   . Panic disorder without agoraphobia   . Abdominal pain, unspecified site   . Irritable bowel syndrome   . Cerebral aneurysm, nonruptured   . Diverticulosis of colon (without mention of hemorrhage)   . Arthropathy, unspecified, site unspecified   . Incontinence of feces   . Personal history of goiter   . Aortic insufficiency   . Persistent atrial fibrillation   . Anemia, iron deficiency   . Hyperlipemia   . GERD (gastroesophageal reflux disease)   . Arthritis   . Tachycardia-bradycardia syndrome     s/ p PPM (MDT)  . H/O: hysterectomy    Past Surgical History  Procedure Date  . Cholecystectomy 2000  . Vesicovaginal fistula closure w/ tah   . Surgery for subdural hematoma   . Tonsillectomy   . Appendectomy   . Ppm implant     by Dr. Jenne Campus 1/09  . H/o pacemaker     Current Outpatient Prescriptions  Medication Sig Dispense Refill  . atorvastatin (LIPITOR) 40 MG tablet TAKE ONE TABLET BY MOUTH AT BEDTIME  30 tablet  6  . diazepam (VALIUM) 5 MG tablet Take 5  mg by mouth daily as needed.        Marland Kitchen estradiol (CLIMARA - DOSED IN MG/24 HR) 0.05 mg/24hr Place 1 patch onto the skin once a week. UAD       . fluticasone (FLONASE) 50 MCG/ACT nasal spray Place 2 sprays into the nose daily.        . furosemide (LASIX) 80 MG tablet TAKE ADDITIONAL 1/2 TABLET AS NEEDED FOR SWELLING  60 tablet  12  . metoprolol (LOPRESSOR) 100 MG tablet Take 1 tablet (100 mg total) by mouth 2 (two) times daily.  60 tablet  6  . multivitamin-lutein (OCUVITE-LUTEIN) CAPS Take 1 capsule by mouth daily.        . Omega-3 Fatty Acids (FISH OIL PO) Take by mouth daily.      . pantoprazole (PROTONIX) 40 MG tablet Take 40 mg by mouth daily.        . potassium chloride SA (K-DUR,KLOR-CON) 20 MEQ tablet Take two tablets twice daily  120 tablet  12  . Probiotic Product (RESTORA PO) Take by mouth.        . warfarin (COUMADIN) 2 MG tablet Take as directed by Anticoagulation clinic   60 tablet  3    Allergies  Allergen Reactions  . Codeine Phosphate     REACTION: unspecified  . Meperidine Hcl   . Morphine Sulfate     REACTION: unspecified    History   Social History  .  Marital Status: Widowed    Spouse Name: N/A    Number of Children: 1  . Years of Education: N/A   Occupational History  . Beautician    Social History Main Topics  . Smoking status: Never Smoker   . Smokeless tobacco: Not on file  . Alcohol Use: No  . Drug Use: Not on file  . Sexually Active: Not on file   Other Topics Concern  . Not on file   Social History Narrative  . No narrative on file    Family History  Problem Relation Age of Onset  . Ovarian cancer    . Uterine cancer    . Colon polyps    . Diabetes    . Heart disease Sister   . Heart disease Sister     Physical Exam: Filed Vitals:   02/01/11 1403  BP: 118/67  Pulse: 65  Height: 5\' 4"  (1.626 m)  Weight: 144 lb 12.8 oz (65.681 kg)    GEN- The patient is well appearing, alert and oriented x 3 today.   Head- normocephalic,  atraumatic Eyes-  Sclera clear, conjunctiva pink Ears- hearing intact Oropharynx- clear Neck- supple, no JVP Lymph- no cervical lymphadenopathy Lungs- Clear to ausculation bilaterally, normal work of breathing Chest- pacemaker pocket is well healed Heart- irregular rate and rhythm,  GI- soft, NT, ND, + BS Extremities- no clubbing, cyanosis, trace edema   Pacemaker interrogation- reviewed in detail today,  See PACEART report  Assessment and Plan:

## 2011-02-01 NOTE — Assessment & Plan Note (Signed)
She is now in persistent afib. This is not surprising given her severe TR/ significant biatrial enlargement. I would recommend rate control going forward. V rates are reasonably controlled by pacemaker interrogation   Goal INR 2-3

## 2011-02-02 DIAGNOSIS — Z961 Presence of intraocular lens: Secondary | ICD-10-CM | POA: Insufficient documentation

## 2011-02-08 ENCOUNTER — Other Ambulatory Visit: Payer: Self-pay | Admitting: Gastroenterology

## 2011-02-09 ENCOUNTER — Other Ambulatory Visit: Payer: Medicare Other

## 2011-02-14 ENCOUNTER — Other Ambulatory Visit: Payer: Self-pay | Admitting: Cardiology

## 2011-02-14 NOTE — Telephone Encounter (Signed)
rx request 

## 2011-02-16 ENCOUNTER — Ambulatory Visit (INDEPENDENT_AMBULATORY_CARE_PROVIDER_SITE_OTHER): Payer: Medicare Other | Admitting: *Deleted

## 2011-02-16 DIAGNOSIS — I4891 Unspecified atrial fibrillation: Secondary | ICD-10-CM

## 2011-02-16 DIAGNOSIS — Z7901 Long term (current) use of anticoagulants: Secondary | ICD-10-CM

## 2011-02-16 LAB — POCT INR: INR: 1.5

## 2011-03-03 ENCOUNTER — Encounter: Payer: Medicare Other | Admitting: *Deleted

## 2011-03-03 ENCOUNTER — Ambulatory Visit
Admission: RE | Admit: 2011-03-03 | Discharge: 2011-03-03 | Disposition: A | Discharge status: Home / Self Care | Payer: Medicare Other | Source: Ambulatory Visit | Attending: Gastroenterology | Admitting: Gastroenterology

## 2011-03-09 ENCOUNTER — Encounter: Payer: Self-pay | Admitting: Cardiology

## 2011-04-04 ENCOUNTER — Ambulatory Visit (INDEPENDENT_AMBULATORY_CARE_PROVIDER_SITE_OTHER): Payer: Medicare Other

## 2011-04-04 DIAGNOSIS — I4891 Unspecified atrial fibrillation: Secondary | ICD-10-CM

## 2011-04-04 DIAGNOSIS — Z7901 Long term (current) use of anticoagulants: Secondary | ICD-10-CM

## 2011-04-05 ENCOUNTER — Ambulatory Visit: Payer: Medicare Other | Admitting: Cardiology

## 2011-04-20 ENCOUNTER — Encounter: Payer: Self-pay | Admitting: Cardiology

## 2011-04-27 DIAGNOSIS — H3589 Other specified retinal disorders: Secondary | ICD-10-CM | POA: Insufficient documentation

## 2011-05-04 ENCOUNTER — Ambulatory Visit (INDEPENDENT_AMBULATORY_CARE_PROVIDER_SITE_OTHER): Payer: Medicare Other | Admitting: Cardiology

## 2011-05-04 ENCOUNTER — Encounter: Payer: Self-pay | Admitting: Cardiology

## 2011-05-04 ENCOUNTER — Ambulatory Visit (INDEPENDENT_AMBULATORY_CARE_PROVIDER_SITE_OTHER): Payer: Medicare Other

## 2011-05-04 VITALS — BP 138/80 | HR 63 | Ht 64.0 in | Wt 145.0 lb

## 2011-05-04 DIAGNOSIS — I4891 Unspecified atrial fibrillation: Secondary | ICD-10-CM

## 2011-05-04 DIAGNOSIS — Z7901 Long term (current) use of anticoagulants: Secondary | ICD-10-CM

## 2011-05-04 DIAGNOSIS — E785 Hyperlipidemia, unspecified: Secondary | ICD-10-CM

## 2011-05-04 DIAGNOSIS — I495 Sick sinus syndrome: Secondary | ICD-10-CM

## 2011-05-04 DIAGNOSIS — I10 Essential (primary) hypertension: Secondary | ICD-10-CM

## 2011-05-04 DIAGNOSIS — N259 Disorder resulting from impaired renal tubular function, unspecified: Secondary | ICD-10-CM

## 2011-05-04 DIAGNOSIS — I2510 Atherosclerotic heart disease of native coronary artery without angina pectoris: Secondary | ICD-10-CM

## 2011-05-04 NOTE — Progress Notes (Signed)
[id number] female with a past medical history of paroxysmal atrial fibrillation, status post pacemaker placement due to tachybradycardia syndrome, coronary artery disease with prior PCI of the right coronary artery in 2007 with drug-eluting stents. A myoview in November of 2010 showed an ejection fraction of 57%. There was possible mild anterior ischemia but felt more likely to be probable soft tissue attenuation. We are treating medically. We previously discontinued her amiodarone secondary to a tremor. She was seen by Tereso Newcomer in Oct of 2012 for increased dyspnea. Echocardiogram in November of 2012 showed an ejection fraction of 50-55%, mild aortic insufficiency, moderate mitral regurgitation, moderate to severe tricuspid regurgitation, moderate biatrial enlargement. There was a small pericardial effusion. BNP was elevated and TSH was low. Her Lasix was increased. Her beta blocker was also increased for frequent PVCs. I last saw her and increased her lasix to 80 mg po bid. Since then, she has DOE but improved; pedal edma has improved; no chest pain; palpitations occasionally at night.   Current Outpatient Prescriptions  Medication Sig Dispense Refill  . amoxapine (ASENDIN) 100 MG tablet Take 100 mg by mouth 3 (three) times daily.      Marland Kitchen atorvastatin (LIPITOR) 40 MG tablet TAKE ONE TABLET BY MOUTH AT BEDTIME  30 tablet  6  . diazepam (VALIUM) 5 MG tablet Take 5 mg by mouth daily as needed.        Marland Kitchen estradiol (CLIMARA - DOSED IN MG/24 HR) 0.05 mg/24hr Place 1 patch onto the skin once a week. UAD       . fluticasone (FLONASE) 50 MCG/ACT nasal spray Place 2 sprays into the nose daily.        . furosemide (LASIX) 80 MG tablet TAKE ADDITIONAL 1/2 TABLET AS NEEDED FOR SWELLING  60 tablet  12  . metoprolol (LOPRESSOR) 100 MG tablet Take 1 tablet (100 mg total) by mouth 2 (two) times daily.  60 tablet  6  . multivitamin-lutein (OCUVITE-LUTEIN) CAPS Take 1 capsule by mouth daily.        . Omega-3  Fatty Acids (FISH OIL PO) Take by mouth daily.      . pantoprazole (PROTONIX) 40 MG tablet Take 40 mg by mouth daily.        . potassium chloride SA (K-DUR,KLOR-CON) 20 MEQ tablet Take two tablets twice daily  120 tablet  12  . Probiotic Product (RESTORA PO) Take by mouth.        . warfarin (COUMADIN) 2 MG tablet TAKE AS DIRECTED BY ANTICOAGULATION CLINIC  60 tablet  2     Past Medical History  Diagnosis Date  . Coronary atherosclerosis of unspecified type of vessel, native or graft   . Unspecified essential hypertension   . Other and unspecified hyperlipidemia   . Personal history of other diseases of circulatory system   . Old myocardial infarction   . Personal history of diseases of blood and blood-forming organs   . Unspecified disorder resulting from impaired renal function   . Esophageal reflux   . Panic disorder without agoraphobia   . Abdominal pain, unspecified site   . Irritable bowel syndrome   . Cerebral aneurysm, nonruptured   . Diverticulosis of colon (without mention of hemorrhage)   . Arthropathy, unspecified, site unspecified   . Incontinence of feces   . Personal history of goiter   . Aortic insufficiency   . Persistent atrial fibrillation   . Anemia, iron deficiency   . Hyperlipemia   . GERD (gastroesophageal reflux  disease)   . Arthritis   . Tachycardia-bradycardia syndrome     s/ p PPM (MDT)  . H/O: hysterectomy     Past Surgical History  Procedure Date  . Cholecystectomy 2000  . Vesicovaginal fistula closure w/ tah   . Surgery for subdural hematoma   . Tonsillectomy   . Appendectomy   . Ppm implant     by Dr. Jenne Campus 1/09  . H/o pacemaker     History   Social History  . Marital Status: Married    Spouse Name: N/A    Number of Children: 1  . Years of Education: N/A   Occupational History  . Beautician    Social History Main Topics  . Smoking status: Never Smoker   . Smokeless tobacco: Not on file  . Alcohol Use: No  . Drug Use: Not  on file  . Sexually Active: Not on file   Other Topics Concern  . Not on file   Social History Narrative  . No narrative on file    ROS: no fevers or chills, productive cough, hemoptysis, dysphasia, odynophagia, melena, hematochezia, dysuria, hematuria, rash, seizure activity, orthopnea, PND, pedal edema, claudication. Remaining systems are negative.  Physical Exam: Well-developed well-nourished in no acute distress.  Skin is warm and dry.  HEENT is normal.  Neck is supple. No thyromegaly.  Chest is clear to auscultation with normal expansion.  Cardiovascular exam is irregular Abdominal exam nontender or distended. No masses palpated. Extremities show trace edema. neuro grossly intact  ECG ventricular pacing. Underlying atrial fibrillation.

## 2011-05-04 NOTE — Assessment & Plan Note (Signed)
Continue present blood pressure medications. 

## 2011-05-04 NOTE — Assessment & Plan Note (Signed)
Continue statin. 

## 2011-05-04 NOTE — Assessment & Plan Note (Signed)
Followed by nephrology. 

## 2011-05-04 NOTE — Assessment & Plan Note (Signed)
Continue beta blocker and Coumadin. 

## 2011-05-04 NOTE — Assessment & Plan Note (Signed)
Status post pacemaker. Management per electrophysiology. 

## 2011-05-04 NOTE — Patient Instructions (Signed)
Your physician recommends that you continue on your current medications as directed. Please refer to the Current Medication list given to you today.  Your physician wants you to follow-up in: 6 months with Dr. Crenshaw. You will receive a reminder letter in the mail two months in advance. If you don't receive a letter, please call our office to schedule the follow-up appointment.  

## 2011-05-09 ENCOUNTER — Other Ambulatory Visit: Payer: Self-pay | Admitting: Cardiology

## 2011-05-31 ENCOUNTER — Encounter (HOSPITAL_COMMUNITY): Payer: Self-pay | Admitting: Emergency Medicine

## 2011-05-31 ENCOUNTER — Emergency Department (HOSPITAL_COMMUNITY): Payer: Medicare Other

## 2011-05-31 ENCOUNTER — Observation Stay (HOSPITAL_COMMUNITY)
Admission: EM | Admit: 2011-05-31 | Discharge: 2011-06-01 | Disposition: A | Discharge status: Home / Self Care | Payer: Medicare Other | Attending: Cardiology | Admitting: Cardiology

## 2011-05-31 DIAGNOSIS — K219 Gastro-esophageal reflux disease without esophagitis: Secondary | ICD-10-CM | POA: Insufficient documentation

## 2011-05-31 DIAGNOSIS — I10 Essential (primary) hypertension: Secondary | ICD-10-CM | POA: Diagnosis present

## 2011-05-31 DIAGNOSIS — N179 Acute kidney failure, unspecified: Secondary | ICD-10-CM | POA: Diagnosis present

## 2011-05-31 DIAGNOSIS — G2581 Restless legs syndrome: Secondary | ICD-10-CM

## 2011-05-31 DIAGNOSIS — R0789 Other chest pain: Principal | ICD-10-CM | POA: Insufficient documentation

## 2011-05-31 DIAGNOSIS — M109 Gout, unspecified: Secondary | ICD-10-CM

## 2011-05-31 DIAGNOSIS — R079 Chest pain, unspecified: Secondary | ICD-10-CM

## 2011-05-31 DIAGNOSIS — N184 Chronic kidney disease, stage 4 (severe): Secondary | ICD-10-CM | POA: Diagnosis present

## 2011-05-31 DIAGNOSIS — F410 Panic disorder [episodic paroxysmal anxiety] without agoraphobia: Secondary | ICD-10-CM | POA: Insufficient documentation

## 2011-05-31 DIAGNOSIS — I495 Sick sinus syndrome: Secondary | ICD-10-CM | POA: Insufficient documentation

## 2011-05-31 DIAGNOSIS — K5730 Diverticulosis of large intestine without perforation or abscess without bleeding: Secondary | ICD-10-CM | POA: Insufficient documentation

## 2011-05-31 DIAGNOSIS — E785 Hyperlipidemia, unspecified: Secondary | ICD-10-CM | POA: Diagnosis present

## 2011-05-31 DIAGNOSIS — I4891 Unspecified atrial fibrillation: Secondary | ICD-10-CM | POA: Insufficient documentation

## 2011-05-31 DIAGNOSIS — E0590 Thyrotoxicosis, unspecified without thyrotoxic crisis or storm: Secondary | ICD-10-CM | POA: Diagnosis present

## 2011-05-31 DIAGNOSIS — I129 Hypertensive chronic kidney disease with stage 1 through stage 4 chronic kidney disease, or unspecified chronic kidney disease: Secondary | ICD-10-CM | POA: Insufficient documentation

## 2011-05-31 DIAGNOSIS — E876 Hypokalemia: Secondary | ICD-10-CM | POA: Insufficient documentation

## 2011-05-31 DIAGNOSIS — I671 Cerebral aneurysm, nonruptured: Secondary | ICD-10-CM | POA: Insufficient documentation

## 2011-05-31 DIAGNOSIS — I2510 Atherosclerotic heart disease of native coronary artery without angina pectoris: Secondary | ICD-10-CM | POA: Insufficient documentation

## 2011-05-31 HISTORY — DX: Unspecified macular degeneration: H35.30

## 2011-05-31 HISTORY — DX: Atherosclerotic heart disease of native coronary artery without angina pectoris: I25.10

## 2011-05-31 HISTORY — DX: Gout, unspecified: M10.9

## 2011-05-31 HISTORY — DX: Restless legs syndrome: G25.81

## 2011-05-31 HISTORY — DX: Anxiety disorder, unspecified: F41.9

## 2011-05-31 HISTORY — DX: Chronic kidney disease, stage 4 (severe): N18.4

## 2011-05-31 HISTORY — DX: Blindness, one eye, unspecified eye: H54.40

## 2011-05-31 HISTORY — DX: Thyrotoxicosis, unspecified without thyrotoxic crisis or storm: E05.90

## 2011-05-31 LAB — CBC
Hemoglobin: 12.8 g/dL (ref 12.0–15.0)
MCH: 31.1 pg (ref 26.0–34.0)
MCHC: 33.8 g/dL (ref 30.0–36.0)
MCV: 92 fL (ref 78.0–100.0)

## 2011-05-31 LAB — BASIC METABOLIC PANEL
BUN: 46 mg/dL — ABNORMAL HIGH (ref 6–23)
Calcium: 9.1 mg/dL (ref 8.4–10.5)
Creatinine, Ser: 1.66 mg/dL — ABNORMAL HIGH (ref 0.50–1.10)
GFR calc Af Amer: 31 mL/min — ABNORMAL LOW (ref >90)
GFR calc non Af Amer: 27 mL/min — ABNORMAL LOW (ref >90)
Glucose, Bld: 88 mg/dL (ref 70–99)
Potassium: 3.3 mEq/L — ABNORMAL LOW (ref 3.5–5.1)

## 2011-05-31 LAB — DIFFERENTIAL
Basophils Relative: 0 % (ref 0–1)
Eosinophils Absolute: 0.1 10*3/uL (ref 0.0–0.7)
Eosinophils Relative: 1 % (ref 0–5)
Lymphs Abs: 1.7 10*3/uL (ref 0.7–4.0)
Monocytes Absolute: 0.8 10*3/uL (ref 0.1–1.0)
Monocytes Relative: 10 % (ref 3–12)
Neutrophils Relative %: 66 % (ref 43–77)

## 2011-05-31 LAB — PRO B NATRIURETIC PEPTIDE: Pro B Natriuretic peptide (BNP): 6356 pg/mL — ABNORMAL HIGH (ref 0–450)

## 2011-05-31 LAB — CARDIAC PANEL(CRET KIN+CKTOT+MB+TROPI)
CK, MB: 1.6 ng/mL (ref 0.3–4.0)
Relative Index: INVALID (ref 0.0–2.5)
Total CK: 50 U/L (ref 7–177)
Troponin I: <0.3 ng/mL (ref <0.30)

## 2011-05-31 LAB — MAGNESIUM: Magnesium: 2.1 mg/dL (ref 1.5–2.5)

## 2011-05-31 MED ORDER — SODIUM CHLORIDE 0.9 % IJ SOLN
3.0000 mL | Freq: Two times a day (BID) | INTRAMUSCULAR | Status: DC
  Administered 2011-05-31: 3 mL via INTRAVENOUS

## 2011-05-31 MED ORDER — GI COCKTAIL ~~LOC~~
30.0000 mL | Freq: Once | ORAL | Status: AC
  Administered 2011-05-31: 30 mL via ORAL
  Filled 2011-05-31: qty 30

## 2011-05-31 MED ORDER — PANTOPRAZOLE SODIUM 40 MG PO TBEC
40.0000 mg | DELAYED_RELEASE_TABLET | Freq: Every day | ORAL | Status: DC
  Filled 2011-05-31: qty 1

## 2011-05-31 MED ORDER — OMEGA-3-ACID ETHYL ESTERS 1 G PO CAPS
2.0000 g | ORAL_CAPSULE | Freq: Every day | ORAL | Status: DC
  Administered 2011-06-01: 2 g via ORAL
  Filled 2011-05-31: qty 2

## 2011-05-31 MED ORDER — ONDANSETRON HCL 4 MG/2ML IJ SOLN: 4.0000 mg | Freq: Four times a day (QID) | INTRAMUSCULAR | Status: DC | PRN

## 2011-05-31 MED ORDER — WARFARIN - PHARMACIST DOSING INPATIENT: Freq: Every day | Status: DC

## 2011-05-31 MED ORDER — HEPARIN SODIUM (PORCINE) 5000 UNIT/ML IJ SOLN
5000.0000 [IU] | Freq: Three times a day (TID) | INTRAMUSCULAR | Status: DC
  Administered 2011-05-31 – 2011-06-01 (×2): 5000 [IU] via SUBCUTANEOUS
  Filled 2011-05-31 (×5): qty 1

## 2011-05-31 MED ORDER — ACETAMINOPHEN 325 MG PO TABS: 650.0000 mg | ORAL_TABLET | Freq: Four times a day (QID) | ORAL | Status: DC | PRN

## 2011-05-31 MED ORDER — METOPROLOL TARTRATE 100 MG PO TABS
100.0000 mg | ORAL_TABLET | Freq: Two times a day (BID) | ORAL | Status: DC
  Administered 2011-05-31: 100 mg via ORAL
  Filled 2011-05-31 (×3): qty 1

## 2011-05-31 MED ORDER — SODIUM CHLORIDE 0.9 % IJ SOLN
3.0000 mL | Freq: Two times a day (BID) | INTRAMUSCULAR | Status: DC
  Administered 2011-05-31 – 2011-06-01 (×2): 3 mL via INTRAVENOUS

## 2011-05-31 MED ORDER — SODIUM CHLORIDE 0.9 % IJ SOLN: 3.0000 mL | INTRAMUSCULAR | Status: DC | PRN

## 2011-05-31 MED ORDER — POTASSIUM CHLORIDE CRYS ER 20 MEQ PO TBCR
40.0000 meq | EXTENDED_RELEASE_TABLET | Freq: Two times a day (BID) | ORAL | Status: DC
  Administered 2011-05-31 – 2011-06-01 (×2): 40 meq via ORAL
  Filled 2011-05-31 (×3): qty 2

## 2011-05-31 MED ORDER — OCUVITE-LUTEIN PO CAPS
2.0000 | ORAL_CAPSULE | Freq: Every day | ORAL | Status: DC
  Administered 2011-06-01: 2 via ORAL
  Filled 2011-05-31: qty 2

## 2011-05-31 MED ORDER — ACETAMINOPHEN 650 MG RE SUPP: 650.0000 mg | Freq: Four times a day (QID) | RECTAL | Status: DC | PRN

## 2011-05-31 MED ORDER — WARFARIN SODIUM 5 MG PO TABS
5.0000 mg | ORAL_TABLET | Freq: Once | ORAL | Status: AC
  Administered 2011-05-31: 5 mg via ORAL
  Filled 2011-05-31: qty 1

## 2011-05-31 MED ORDER — ONDANSETRON HCL 4 MG PO TABS: 4.0000 mg | ORAL_TABLET | Freq: Four times a day (QID) | ORAL | Status: DC | PRN

## 2011-05-31 MED ORDER — NITROGLYCERIN IN D5W 200-5 MCG/ML-% IV SOLN
5.0000 ug/min | INTRAVENOUS | Status: DC
  Filled 2011-05-31: qty 250

## 2011-05-31 MED ORDER — ESTRADIOL 0.05 MG/24HR TD PTWK
0.0500 mg | MEDICATED_PATCH | TRANSDERMAL | Status: DC
  Filled 2011-05-31: qty 1

## 2011-05-31 MED ORDER — FLUTICASONE PROPIONATE 50 MCG/ACT NA SUSP
2.0000 | Freq: Every day | NASAL | Status: DC
  Filled 2011-05-31: qty 16

## 2011-05-31 MED ORDER — ATORVASTATIN CALCIUM 40 MG PO TABS
40.0000 mg | ORAL_TABLET | Freq: Every day | ORAL | Status: DC
  Administered 2011-05-31: 40 mg via ORAL
  Filled 2011-05-31 (×2): qty 1

## 2011-05-31 MED ORDER — DIAZEPAM 5 MG PO TABS
5.0000 mg | ORAL_TABLET | Freq: Every day | ORAL | Status: DC | PRN
  Administered 2011-05-31: 5 mg via ORAL
  Filled 2011-05-31: qty 1

## 2011-05-31 MED ORDER — ALUM & MAG HYDROXIDE-SIMETH 200-200-20 MG/5ML PO SUSP: 30.0000 mL | Freq: Four times a day (QID) | ORAL | Status: DC | PRN

## 2011-05-31 MED ORDER — ASPIRIN EC 81 MG PO TBEC
81.0000 mg | DELAYED_RELEASE_TABLET | Freq: Every day | ORAL | Status: DC
  Administered 2011-05-31: 81 mg via ORAL
  Filled 2011-05-31 (×2): qty 1

## 2011-05-31 MED ORDER — FUROSEMIDE 80 MG PO TABS
80.0000 mg | ORAL_TABLET | Freq: Two times a day (BID) | ORAL | Status: DC
  Administered 2011-05-31 – 2011-06-01 (×2): 80 mg via ORAL
  Filled 2011-05-31 (×3): qty 1

## 2011-05-31 NOTE — ED Notes (Signed)
Attempted to call report to [phone] - nurse unavailable to take at this time

## 2011-05-31 NOTE — ED Notes (Signed)
Family at bedside. Son 

## 2011-05-31 NOTE — ED Provider Notes (Signed)
History     CSN: [id number]  Arrival date & time 05/31/11  1016   First MD Initiated Contact with Patient 05/31/11 1028      Chief Complaint  Patient presents with  . Chest Pain    (Consider location/radiation/quality/duration/timing/severity/associated sxs/prior treatment) HPI History provided by pt.   Pt developed a non-radiating squeezing sensation across her chest this morning between 5 and 6 while lying in bed.  Associated w/ nausea; denies SOB and diaphoresis.  Received aspirin and SL nitro en route to hospital and pain improved shortly after.  Has h/o MI in 2007 and current pain w/ similar features.    Past Medical History  Diagnosis Date  . Coronary atherosclerosis of unspecified type of vessel, native or graft   . Unspecified essential hypertension   . Other and unspecified hyperlipidemia   . Personal history of other diseases of circulatory system   . Old myocardial infarction   . Personal history of diseases of blood and blood-forming organs   . Unspecified disorder resulting from impaired renal function   . Esophageal reflux   . Panic disorder without agoraphobia   . Abdominal pain, unspecified site   . Irritable bowel syndrome   . Cerebral aneurysm, nonruptured   . Diverticulosis of colon (without mention of hemorrhage)   . Arthropathy, unspecified, site unspecified   . Incontinence of feces   . Personal history of goiter   . Aortic insufficiency   . Persistent atrial fibrillation   . Anemia, iron deficiency   . Hyperlipemia   . GERD (gastroesophageal reflux disease)   . Arthritis   . Tachycardia-bradycardia syndrome     s/ p PPM (MDT)  . H/O: hysterectomy     Past Surgical History  Procedure Date  . Cholecystectomy 2000  . Vesicovaginal fistula closure w/ tah   . Surgery for subdural hematoma   . Tonsillectomy   . Appendectomy   . Ppm implant     by Dr. Jenne Campus 1/09  . H/o pacemaker     Family History  Problem Relation Age of Onset  . Ovarian  cancer    . Uterine cancer    . Colon polyps    . Diabetes    . Heart disease Sister   . Heart disease Sister     History  Substance Use Topics  . Smoking status: Never Smoker   . Smokeless tobacco: Not on file  . Alcohol Use: No    OB History    Grav Para Term Preterm Abortions TAB SAB Ect Mult Living                  Review of Systems  All other systems reviewed and are negative.    Allergies  Codeine phosphate; Diovan; Lopid; Meperidine hcl; Morphine sulfate; and Tricor  Home Medications   Current Outpatient Rx  Name Route Sig Dispense Refill  . AMOXAPINE 100 MG PO TABS Oral Take 100 mg by mouth 3 (three) times daily.    . ATORVASTATIN CALCIUM 40 MG PO TABS  TAKE ONE TABLET BY MOUTH AT BEDTIME 30 tablet 5  . DIAZEPAM 5 MG PO TABS Oral Take 5 mg by mouth daily as needed.      Marland Kitchen ESTRADIOL 0.05 MG/24HR TD PTWK Transdermal Place 1 patch onto the skin once a week. UAD     . FLUTICASONE PROPIONATE 50 MCG/ACT NA SUSP Nasal Place 2 sprays into the nose daily.      . FUROSEMIDE 80 MG PO TABS  TAKE ADDITIONAL 1/2 TABLET AS NEEDED FOR SWELLING 60 tablet 12  . METOPROLOL TARTRATE 100 MG PO TABS Oral Take 1 tablet (100 mg total) by mouth 2 (two) times daily. 60 tablet 6  . OCUVITE-LUTEIN PO CAPS Oral Take 1 capsule by mouth daily.      Marland Kitchen FISH OIL PO Oral Take by mouth daily.    Marland Kitchen PANTOPRAZOLE SODIUM 40 MG PO TBEC Oral Take 40 mg by mouth daily.      Marland Kitchen POTASSIUM CHLORIDE CRYS ER 20 MEQ PO TBCR  Take two tablets twice daily 120 tablet 12  . RESTORA PO Oral Take by mouth.      . WARFARIN SODIUM 2 MG PO TABS  TAKE AS DIRECTED BY ANTICOAGULATION CLINIC 60 tablet 2    BP 168/83  Pulse 61  Temp(Src) 97.7 F (36.5 C) (Oral)  Resp 11  SpO2 97%  Physical Exam  Nursing note and vitals reviewed. Constitutional: She is oriented to person, place, and time. She appears well-developed and well-nourished. No distress.  HENT:  Head: Normocephalic and atraumatic.  Eyes:       Normal  appearance  Neck: Normal range of motion.  Cardiovascular: Normal rate, regular rhythm and intact distal pulses.   Pulmonary/Chest: Effort normal and breath sounds normal. No respiratory distress. She exhibits no tenderness.       No pleuritic pain reported  Abdominal: Soft. Bowel sounds are normal. She exhibits no distension. There is no tenderness. There is no guarding.  Musculoskeletal: Normal range of motion.       No peripheral edema or calf tenderness  Neurological: She is alert and oriented to person, place, and time.  Skin: Skin is warm and dry. No rash noted.  Psychiatric: She has a normal mood and affect. Her behavior is normal.    ED Course  Procedures (including critical care time)  Labs Reviewed  CBC - Abnormal; Notable for the following:    RDW 16.4 (*)    Platelets 149 (*)    All other components within normal limits  BASIC METABOLIC PANEL - Abnormal; Notable for the following:    Potassium 3.3 (*)    BUN 46 (*)    Creatinine, Ser 1.66 (*)    GFR calc non Af Amer 27 (*)    GFR calc Af Amer 31 (*)    All other components within normal limits  PROTIME-INR - Abnormal; Notable for the following:    Prothrombin Time 19.0 (*)    INR 1.56 (*)    All other components within normal limits  PRO B NATRIURETIC PEPTIDE - Abnormal; Notable for the following:    Pro B Natriuretic peptide (BNP) 6356.0 (*)    All other components within normal limits  DIFFERENTIAL  TROPONIN I  MAGNESIUM  CARDIAC PANEL(CRET KIN+CKTOT+MB+TROPI)  TSH  CARDIAC PANEL(CRET KIN+CKTOT+MB+TROPI)  CARDIAC PANEL(CRET KIN+CKTOT+MB+TROPI)  BASIC METABOLIC PANEL  T4, FREE  LIPID PANEL  PROTIME-INR   Dg Chest 2 View  05/31/2011  *RADIOLOGY REPORT*  Clinical Data: Chest pain and.  High blood pressure.  CHEST - 2 VIEW  Comparison: 11/19/2010 and 11/03/2009.  Findings: Sequential pacemaker is in place entering from the left with leads unchanged in position.  No obvious disruption of the leads.   Cardiomegaly.  Tortuous aorta.  Central pulmonary vascular prominence.  Scarring lung bases.  No segmental infiltrate.  No pulmonary edema or pneumothorax.  Biapical pleural thickening without associated bony destruction stable.  IMPRESSION: Cardiomegaly with pacemaker in place.  Mild central  pulmonary vascular prominence. No infiltrate, congestive heart failure or pneumothorax.  Tortuous aorta.  Original Report Authenticated By: Fuller Canada, M.D.     1. Chest pain   2. Tachycardia-bradycardia   3. Atrial fibrillation       MDM  78yo F w/ h/o NSTEMI and 2 drug eluting stents to RCA presents w/ squeezing CP w/ associated nausea today.  Sx feel similar to past MI.  Received aspirin en route to hospital and currently pain free. No acute findings on exam.  EKG non-ischemic and first troponin neg.  CXR shows cardiomegaly w/ mild central pulmonary vascular prominence.  Results discussed w/ pt and her husband.  [facility] consulted for admission.         Arie Sabina [place], [place] 05/31/11 2122

## 2011-05-31 NOTE — ED Notes (Signed)
Catie, Pa Bedside
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
## 2011-05-31 NOTE — ED Notes (Signed)
Resting quietly on stretcher with husband at bedside; no complaints at this time

## 2011-05-31 NOTE — ED Notes (Signed)
Per EMS- pt c/o central chest pain with no other s/s.  Picked up from home.  Pt has had 324 of aspirin and a total of 3 sublingual nitroglycerin with relief.  Pt has a paced maker with paced rhythm.

## 2011-05-31 NOTE — ED Notes (Signed)
Report called to Tammy on 3700

## 2011-05-31 NOTE — H&P (Signed)
[facility] Resident Note  Patient ID: JANISHA BUESO MRN: [id number] DOB/AGE: April 14, 1922 78 y.o. Admit date: 05/31/2011  Primary Care Physician:GRIFFIN,JOHN Jomarie Longs, MD, MD Primary Cardiologist: [name]  Principal Problem:  *CHEST PAIN Active Problems:  HYPERLIPIDEMIA  HYPERTENSION  CORONARY ARTERY DISEASE  GERD  RENAL INSUFFICIENCY  Hyperthyroidism  HPI: Ms. Matusik is an 78 year old woman who presented to [facility] ED today complaining of central chest pain.  She states that she woke this morning and was lying in bed when she noticed a deep, sharp pain in her central chest.  She states that it did not radiate to her arm, neck, or stomach.  She was mildly "quesy" but did not vomit and had no diaphoresis.  She states that she doesn't think this felt like her previous MI that she had in 2007 when the pain started in her abdomen and moved up to her chest, neck, and out to her arms.  She got up and took a valium, drank some mild, and sat in her chair.  When the pain did not go away she called the nurse who serves her area at [place] and the nurse called the ambulance.  She received 3 nitros and 4 baby aspirin on the way to the hospital.  She states that the nitroglycerins did not help her pain much.  Once here she had a GI cocktail and a nitroglycerin drip started.  She only got the nitro drip for 15 minutes and then was chest pain free.  She continues to be chest pain free on my exam.    She was recently treated for a gout flare in her right great toe with a steroid taper.  She started after seeing Dr. Valentina Lucks, her PCP, on Friday and today, 5/8, was supposed to be her last day.      Of note she had a hysterectomy with bilateral oophorectomy ~40-45 years ago and has been on estrogen therapy since then.  She used to take shots but has been on the estrogen patch for some time.    Past Medical History  Diagnosis Date  . Coronary atherosclerosis of unspecified type  of vessel, native or graft   . Unspecified essential hypertension   . Other and unspecified hyperlipidemia   . Personal history of other diseases of circulatory system   . Old myocardial infarction   . Personal history of diseases of blood and blood-forming organs   . Unspecified disorder resulting from impaired renal function   . Esophageal reflux   . Panic disorder without agoraphobia   . Abdominal pain, unspecified site   . Irritable bowel syndrome   . Cerebral aneurysm, nonruptured   . Diverticulosis of colon (without mention of hemorrhage)   . Arthropathy, unspecified, site unspecified   . Incontinence of feces   . Personal history of goiter   . Aortic insufficiency   . Persistent atrial fibrillation   . Anemia, iron deficiency   . Hyperlipemia   . GERD (gastroesophageal reflux disease)   . Arthritis   . Tachycardia-bradycardia syndrome     s/ p PPM (MDT)  . H/O: hysterectomy     Past Surgical History  Procedure Date  . Cholecystectomy 2000  . Vesicovaginal fistula closure w/ tah   . Surgery for subdural hematoma   . Tonsillectomy   . Appendectomy   . Ppm implant     by Dr. Jenne Campus 1/09  . H/o pacemaker     Family History  Problem  Relation Age of Onset  . Ovarian cancer    . Uterine cancer    . Colon polyps    . Diabetes    . Heart disease Sister   . Heart disease Sister     History   Social History  . Marital Status: Married    Spouse Name: N/A    Number of Children: 1  . Years of Education: N/A   Occupational History  . Beautician    Social History Main Topics  . Smoking status: Never Smoker   . Smokeless tobacco: Not on file  . Alcohol Use: No  . Drug Use: Not on file  . Sexually Active: Not on file   Other Topics Concern  . Not on file   Social History Narrative  . No narrative on file     Current Facility-Administered Medications  Medication Dose Route Frequency Provider Last Rate Last Dose  . gi cocktail (Maalox,Lidocaine,Donnatal)   30 mL Oral Once Otilio Miu, PA   30 mL at 05/31/11 1416  . DISCONTD: nitroGLYCERIN 0.2 mg/mL in dextrose 5 % infusion  5 mcg/min Intravenous Titrated Otilio Miu, PA       Current Outpatient Prescriptions  Medication Sig Dispense Refill  . atorvastatin (LIPITOR) 40 MG tablet TAKE ONE TABLET BY MOUTH AT BEDTIME  30 tablet  5  . diazepam (VALIUM) 5 MG tablet Take 5 mg by mouth daily as needed.        Marland Kitchen estradiol (CLIMARA - DOSED IN MG/24 HR) 0.05 mg/24hr Place 1 patch onto the skin once a week. Pt places on Sunday      . fluticasone (FLONASE) 50 MCG/ACT nasal spray Place 2 sprays into the nose daily.        . furosemide (LASIX) 80 MG tablet Take 80 mg by mouth 2 (two) times daily.   60 tablet  12  . metoprolol (LOPRESSOR) 100 MG tablet Take 1 tablet (100 mg total) by mouth 2 (two) times daily.  60 tablet  6  . multivitamin-lutein (OCUVITE-LUTEIN) CAPS Take 2 capsules by mouth daily.       . Omega-3 Fatty Acids (FISH OIL PO) Take by mouth daily.      . pantoprazole (PROTONIX) 40 MG tablet Take 40 mg by mouth daily.        . potassium chloride SA (K-DUR,KLOR-CON) 20 MEQ tablet Take two tablets twice daily  120 tablet  12  . Probiotic Product (ALIGN PO) Take 1 tablet by mouth daily.      Marland Kitchen warfarin (COUMADIN) 2 MG tablet Take 2-3 mg by mouth daily. Pt takes one tablet on Monday ane one and one half tablet all other days.      Marland Kitchen warfarin (COUMADIN) 2 MG tablet       . DISCONTD: warfarin (COUMADIN) 2 MG tablet TAKE AS DIRECTED BY ANTICOAGULATION CLINIC  60 tablet  2   ROS: Bold if positive:  General:  fevers/chills/night sweats Eyes:  blurry vision, diplopia,  amaurosis ENT:  sore throat , hearing loss Resp: cough, wheezing,  hemoptysis CV: Chest pain, edema , palpitations GI:  abdominal pain, nausea, vomiting, diarrhea,  constipation GU: dysuria, frequency,  hematuria Skin:  rash Neuro: headache, numbness, tingling,  weakness of extremities Musculoskeletal: joint pain  or swelling Heme:  bleeding, DVT, or easy bruising Endo:  polydipsia , polyuria  Physical Exam: Blood pressure 146/63, pulse 60, temperature 97.5 F (36.4 C), temperature source Oral, resp. rate 12, SpO2 98.00%. Current Weight  05/04/11  145 lb (65.772 kg)  02/01/11 144 lb 12.8 oz (65.681 kg)  01/05/11 146 lb (66.225 kg)  12/08/10 153 lb 12.8 oz (69.763 kg)    General: Vital signs reviewed and noted. Well-developed, well-nourished, in no acute distress; alert, appropriate and cooperative throughout examination.  HEENT: normal, no scleral icterus. Neck: JVP 6-7 cm. Carotid upstrokes normal without bruits. No thyromegaly. Lungs: Normal respiratory effort. Clear to auscultation BL without crackles or wheezes. CV: RRR without murmur or gallop Abd: soft, mild epigastric tenderness to palpation, +BS, no bruit, no hepatosplenomegaly Back: no CVA tenderness Ext: no clubbing, cyanosis, edema.  Varicose veins noted bilaterally. Trace edema to the bilateral knees.         Femoral pulses 2+equal without bruits        DP/PT pulses intact and equal Skin: warm and dry without rash Neuro: CNII-XII intact             Strength intact = bilaterally   Labs: CBC:  Basename 05/31/11 1107  WBC 7.5  NEUTROABS 4.9  HGB 12.8  HCT 37.9  MCV 92.0  PLT 149*   CMET:  Lab 05/31/11 1107  NA 135  K 3.3*  CL 97  CO2 26  BUN 46*  CREATININE 1.66*  CALCIUM 9.1  PROT --  BILITOT --  ALKPHOS --  ALT --  AST --  GLUCOSE 88   Cardiac Enzymes:  Basename 05/31/11 1108  CKTOTAL --  CKMB --  CKMBINDEX --  TROPONINI <0.30   Coagulation:  Basename 05/31/11 1107  LABPROT 19.0*  INR 1.56*   Radiology: Dg Chest 2 View  05/31/2011  *RADIOLOGY REPORT*  Clinical Data: Chest pain and.  High blood pressure.  CHEST - 2 VIEW  Comparison: 11/19/2010 and 11/03/2009.  Findings: Sequential pacemaker is in place entering from the left with leads unchanged in position.  No obvious disruption of the leads.   Cardiomegaly.  Tortuous aorta.  Central pulmonary vascular prominence.  Scarring lung bases.  No segmental infiltrate.  No pulmonary edema or pneumothorax.  Biapical pleural thickening without associated bony destruction stable.  IMPRESSION: Cardiomegaly with pacemaker in place.  Mild central pulmonary vascular prominence. No infiltrate, congestive heart failure or pneumothorax.  Tortuous aorta.  Original Report Authenticated By: Fuller Canada, M.D.   EKG: Ventricular paced  Last Echo (11/24/10):  - Left ventricle: The cavity size was normal. Wall thickness was normal. Systolic function was normal. The estimated ejection fraction was in the range of 50% to 55%. Wall motion was normal; there were no regional wall motion abnormalities.  - Aortic valve: Mild regurgitation. - Mitral valve: Moderate regurgitation. - Left atrium: The atrium was moderately dilated. - Right atrium: The atrium was moderately dilated. - Tricuspid valve: Moderate-severe regurgitation. - Pulmonary arteries: Systolic pressure was mildly increased. PA peak pressure: 34mm Hg (S). - Pericardium, extracardiac: A small pericardial effusion was identified.  Last Cath 06/14/06:  1. Normal LV function with residual mild mid to basal posterior  hypocontractility.  2. A 50% to 60% smooth narrowing in the mid LAD.  3. Luminal irregularity of 20% to 30% in the proximal mid right  coronary artery with 60% to 70% haziness with probable thrombus an  eccentric narrowing in the previously placed RCA stent beyond the  crux.  4. Normal circumflex system.  5. Successful PTCA within the stented segment of the right coronary  artery resulting in complete resolution of thrombus and with  residual narrowing of 0% and brisk TIMI-III flow done with  bivalirudin, oral Plavix, and IC nitroglycerin.  6. Brief episode of supraventricular tachycardia treated with IV  Lopressor.  ASSESSMENT AND PLAN:  Pt is a 78 y.o. yo female with a PMHx listed  above who was admitted on 05/31/2011 for chest pain.  She has a history of MI with CAD and previous stenting x 2 to the RCA with repeat angioplasty in 2008.  1. Chest pain:  Differential diagnosis includes acute myocardial infarction, PNA, PE, dissection, GERD, anxiety, and musculoskeletal chest pain.  Her chest x-ray is clear and she has no cough or fever so PNA is less likely.  Her modified Louie Casa is 1 so likelihood of PE is very low.  She has does not have a widened mediastinum on chest x-ray and her pain is not typical so dissection is less likely.  She does have a history of MI and previous stenting in place.  She is also ventricular paced so analysis of her EKG is limited.  Initial Troponin is negative and she is currently chest pain free.  She does have a long history of anxiety as well as GERD for which she is treated as well.  I am unable to reproduce her chest pain with palpation so musculoskeletal is less likely as well.    - Admit to Telemetry, observation  - Cycle CE  - Check TSH, fT4, FLP  - Assuming everything is negative we could consider doing a nuclear stress test tomorrow as an outpatient.   2. Hypertension: Blood pressure mildly elevated but she has not gotten any of her medications for the day.  We will continue her home medications and continue to monitor.   3. CKD Stage IV:  Followed by Dr. Darrick Penna.  Her baseline creatinine is 1.5-2.0 and she is right in that range today.  We will continue to monitor her fluid intake and continue her lasix.   4. Hyperthyroidism:  She has a history of hyperthyroidism but is not currently taking anything to treat her hyperthyroidism.  We will check her TSH and Free T4 today to monitor this.  5.  Hyperlipidemia:  Her last FLP in our system was from 2/12.  We will check her FLP while she is here.   6. DVT PPX: Heparin 5000 U Q8  Patient history and plan of care reviewed with attending, Dr. Gala Romney  Signed: PRIBULA,CHRISTOPHER 05/31/2011, 3:22  PM As above, patient seen and examined. 78 year old female very well-known to me. She has a history of coronary disease, permanent atrial fibrillation, previous pacemaker placement, renal insufficiency. At approximately 5 AM she developed chest pain. She states it began in her abdomen and radiated to her chest. She felt like it was indigestion. There were no associated symptoms. It was not pleuritic, positional and there is no associated water brash. Her pain persisted for 4-5 hours. She is now pain-free. Her electrocardiogram shows atrial fibrillation with ventricular pacing. Initial cardiac markers are negative. Her symptoms are atypical and may be GI related. Admit and rule out myocardial infarction with serial enzymes. If enzymes negative plan outpatient Myoview. I would like to be conservative if possible given her multiple medical problems including significant renal insufficiency. Continue Coumadin, beta blocker and statin. [name] 05/31/11 4:15 PM

## 2011-05-31 NOTE — ED Notes (Signed)
Pt denies chest pain or pressure, Nitro discontinued.

## 2011-06-01 ENCOUNTER — Encounter (HOSPITAL_COMMUNITY): Payer: Self-pay | Admitting: Cardiology

## 2011-06-01 ENCOUNTER — Ambulatory Visit (HOSPITAL_COMMUNITY): Payer: Medicare Other | Attending: Cardiology | Admitting: Radiology

## 2011-06-01 VITALS — BP 134/98 | Ht 64.0 in | Wt 142.0 lb

## 2011-06-01 DIAGNOSIS — I4891 Unspecified atrial fibrillation: Secondary | ICD-10-CM

## 2011-06-01 DIAGNOSIS — Z950 Presence of cardiac pacemaker: Secondary | ICD-10-CM | POA: Insufficient documentation

## 2011-06-01 DIAGNOSIS — Z8249 Family history of ischemic heart disease and other diseases of the circulatory system: Secondary | ICD-10-CM | POA: Insufficient documentation

## 2011-06-01 DIAGNOSIS — E785 Hyperlipidemia, unspecified: Secondary | ICD-10-CM | POA: Insufficient documentation

## 2011-06-01 DIAGNOSIS — I10 Essential (primary) hypertension: Secondary | ICD-10-CM | POA: Insufficient documentation

## 2011-06-01 DIAGNOSIS — I252 Old myocardial infarction: Secondary | ICD-10-CM | POA: Insufficient documentation

## 2011-06-01 DIAGNOSIS — Z9861 Coronary angioplasty status: Secondary | ICD-10-CM | POA: Insufficient documentation

## 2011-06-01 DIAGNOSIS — I495 Sick sinus syndrome: Secondary | ICD-10-CM | POA: Insufficient documentation

## 2011-06-01 DIAGNOSIS — R079 Chest pain, unspecified: Secondary | ICD-10-CM | POA: Insufficient documentation

## 2011-06-01 DIAGNOSIS — R110 Nausea: Secondary | ICD-10-CM | POA: Insufficient documentation

## 2011-06-01 LAB — BASIC METABOLIC PANEL
BUN: 42 mg/dL — ABNORMAL HIGH (ref 6–23)
CO2: 26 mEq/L (ref 19–32)
Calcium: 8.6 mg/dL (ref 8.4–10.5)
Chloride: 98 mEq/L (ref 96–112)
Creatinine, Ser: 1.61 mg/dL — ABNORMAL HIGH (ref 0.50–1.10)
GFR calc Af Amer: 32 mL/min — ABNORMAL LOW (ref >90)
GFR calc non Af Amer: 28 mL/min — ABNORMAL LOW (ref >90)
Glucose, Bld: 85 mg/dL (ref 70–99)
Potassium: 3.3 mEq/L — ABNORMAL LOW (ref 3.5–5.1)
Sodium: 136 mEq/L (ref 135–145)

## 2011-06-01 LAB — LIPID PANEL
Cholesterol: 83 mg/dL (ref 0–200)
HDL: 25 mg/dL — ABNORMAL LOW (ref >39)
LDL Cholesterol: 18 mg/dL (ref 0–99)
Total CHOL/HDL Ratio: 3.3 RATIO
Triglycerides: 198 mg/dL — ABNORMAL HIGH (ref <150)
VLDL: 40 mg/dL (ref 0–40)

## 2011-06-01 LAB — CARDIAC PANEL(CRET KIN+CKTOT+MB+TROPI)
CK, MB: 1.4 ng/mL (ref 0.3–4.0)
CK, MB: 1.5 ng/mL (ref 0.3–4.0)
Relative Index: INVALID (ref 0.0–2.5)
Relative Index: INVALID (ref 0.0–2.5)
Total CK: 61 U/L (ref 7–177)
Total CK: 43 U/L (ref 7–177)
Troponin I: <0.3 ng/mL (ref <0.30)
Troponin I: <0.3 ng/mL (ref <0.30)

## 2011-06-01 LAB — PROTIME-INR
INR: 1.58 — ABNORMAL HIGH (ref 0.00–1.49)
Prothrombin Time: 19.2 seconds — ABNORMAL HIGH (ref 11.6–15.2)

## 2011-06-01 LAB — TSH: TSH: 3.116 u[IU]/mL (ref 0.350–4.500)

## 2011-06-01 LAB — T4, FREE: Free T4: 1 ng/dL (ref 0.80–1.80)

## 2011-06-01 MED ORDER — POTASSIUM CHLORIDE CRYS ER 20 MEQ PO TBCR
40.0000 meq | EXTENDED_RELEASE_TABLET | Freq: Once | ORAL | Status: AC
  Administered 2011-06-01: 40 meq via ORAL
  Filled 2011-06-01: qty 2

## 2011-06-01 MED ORDER — TECHNETIUM TC 99M TETROFOSMIN IV KIT
33.0000 | PACK | Freq: Once | INTRAVENOUS | Status: AC | PRN
  Administered 2011-06-01: 33 via INTRAVENOUS

## 2011-06-01 MED ORDER — AMINOPHYLLINE 25 MG/ML IV SOLN
75.0000 mg | Freq: Once | INTRAVENOUS | Status: AC
  Administered 2011-06-01: 75 mg via INTRAVENOUS

## 2011-06-01 MED ORDER — REGADENOSON 0.4 MG/5ML IV SOLN
0.4000 mg | Freq: Once | INTRAVENOUS | Status: AC
  Administered 2011-06-01: 0.4 mg via INTRAVENOUS

## 2011-06-01 MED ORDER — TECHNETIUM TC 99M TETROFOSMIN IV KIT
11.0000 | PACK | Freq: Once | INTRAVENOUS | Status: AC | PRN
  Administered 2011-06-01: 11 via INTRAVENOUS

## 2011-06-01 MED ORDER — WARFARIN SODIUM 5 MG PO TABS
5.0000 mg | ORAL_TABLET | Freq: Once | ORAL | Status: DC
  Filled 2011-06-01: qty 1

## 2011-06-01 NOTE — Discharge Summary (Signed)
See progress notes Tracey [name]  

## 2011-06-01 NOTE — Discharge Summary (Signed)
Discharge Summary   Patient ID: Tracey [name] MRN: [id number], DOB/AGE: [birth year] 78 y.o.  Primary MD: [name], MD Primary Cardiologist: Dr. Jens Som  Admit date: 05/31/2011 D/C date:     06/01/2011      Primary Discharge Diagnoses:  1. Chest pain, Atypical likely GI in etiology  - No objective evidence of ischemia  - Outpatient Lexiscan Myoview today 06/01/11   Secondary Discharge Diagnoses:  1. CAD s/p stenting x2 to RCA w/ repeat PTCA RCA '08 2. Permanent Atrial Fibrillation - on coumadin 3. Tachy-Brady Syndrome s/p Medtronic PPM '09 4. Aortic Insufficiency 5. HTN 6. HLD 7. CKD Stage IV 8. Cerebral Aneurysm 9. Iron Deficiency Anemia 10. GERD 11. Hyperthyroidism 12. Exogenous Estrogen Replacement 13. Diverticulosis 14. IBS 15. Arthritis 16. Gout 17. Panic Disorder 18. Appendectomy 19. Cholecystectomy  20. Surgery for subdural hematoma 21. Vesicovaginal fistula closure w/ Total Abdominal Hysterectomy 22. Tonsillectomy  Allergies Allergies  Allergen Reactions  . Codeine Phosphate Anxiety    "makes me so nervous I feel like I'm dying"  . Meperidine Hcl Anxiety    "makes me think I'm going to die"  . Morphine Sulfate Other (See Comments)    "turned red as a beet"  . Diovan (Valsartan) Other (See Comments)    unknown  . Lopid (Gemfibrozil) Other (See Comments)    unknown  . Tricor (Fenofibrate) Other (See Comments)    Unknown     Diagnostic Studies/Procedures:  None  History of Present Illness: 78 y.o. female w/ the above medical problems who presented to [facility] on 05/31/11 with complaints of chest pain.  On day of presentation while lying in bed she noticed a deep, sharp pain that started in her abdomen and radiated into her central chest, without diaphoresis or sob, but some nausea.  When the pain did not go away with Valium she called the nurse who serves her area at [place] and the nurse called an ambulance. She received  3 nitros and 4 baby aspirin on the way to the hospital without much improvement in pain. She was recently treated for a gout flare in her right great toe with a steroid taper to be completed on 5/8. Of note she had a hysterectomy with bilateral oophorectomy ~40-45 years ago and has been on estrogen therapy since then.   Hospital Course: In the ED, EKG revealed A.Fib, V-Paced. CXR was without acute cardiopulmonary abnormalities. Labs were significant for normal troponin, CBC WNL, K+ 3.3, Crt 1.66, INR 1.56. She received GI cocktail and was placed on NTG drip with chest pain relief after . Her symptoms were atypical, but due to her cardiac history she was admitted for further evaluation and treatment.  Cardiac enzymes were cycled and remained negative. She has a history of hyperthyroidism, but was not currently receiving treatment. TSH and FreeT4 were checked and within normal limits. She had mild hypokalemia which was supplemented. BNP was elevated, but there were no clinical signs of volume overload and she was continued on home Lasix. INR was subtherapeutic at 1.56. Coumadin was continued with plans for coumadin clinic follow up next week. She had no further episodes of chest pain. It was felt her symptoms were likely GI related and she could be safely discharged with outpatient [facility], which was able to be scheduled today. She was made NPO and BB held for the upcoming procedure.  She was seen and evaluated by Dr. Jens Som who felt she was stable for discharge home with plans for  follow up as scheduled below.  Discharge Vitals: Blood pressure 127/72, pulse 60, temperature 98 F (36.7 C), temperature source Oral, resp. rate 16, height 5\' 4"  (1.626 m), weight 144 lb (65.318 kg), SpO2 94.00%.  Labs: Component Value Date   WBC 7.5 05/31/2011   HGB 12.8 05/31/2011   HCT 37.9 05/31/2011   MCV 92.0 05/31/2011   PLT 149* 05/31/2011    Lab 06/01/11 0550  NA 136  K 3.3*  CL 98  CO2 26  BUN 42*    CREATININE 1.61*  CALCIUM 8.6  GLUCOSE 85   Basename 06/01/11 0550 05/31/11 2323 05/31/11 1821 05/31/11 1108  CKTOTAL 43 61 50 --  CKMB 1.5 1.4 1.6 --  TROPONINI <0.30 <0.30 <0.30 <0.30   Component Value Date   CHOL 83 06/01/2011   HDL 25* 06/01/2011   LDLCALC 18 06/01/2011   TRIG 198* 06/01/2011     05/31/2011 11:07 06/01/2011 05:50  Prothrombin Time 19.0 (H) 19.2 (H)  INR 1.56 (H) 1.58 (H)     05/31/2011 18:21  Pro B Natriuretic peptide (BNP) 6356.0 (H)     05/31/2011 18:21  TSH 3.116  Free T4 1.00   Discharge Medications   Medication List  As of 06/01/2011 10:40 AM   TAKE these medications         ALIGN PO   Take 1 tablet by mouth daily.      atorvastatin 40 MG tablet   Commonly known as: LIPITOR   TAKE ONE TABLET BY MOUTH AT BEDTIME      diazepam 5 MG tablet   Commonly known as: VALIUM   Take 5 mg by mouth daily as needed.      estradiol 0.05 mg/24hr   Commonly known as: CLIMARA - Dosed in mg/24 hr   Place 1 patch onto the skin once a week. Pt places on Sunday      FISH OIL PO   Take by mouth daily.      FLONASE 50 MCG/ACT nasal spray   Generic drug: fluticasone   Place 2 sprays into the nose daily.      furosemide 80 MG tablet   Commonly known as: LASIX   Take 80 mg by mouth 2 (two) times daily.      metoprolol 100 MG tablet   Commonly known as: LOPRESSOR   Take 1 tablet (100 mg total) by mouth 2 (two) times daily.      multivitamin-lutein Caps   Take 2 capsules by mouth daily.      pantoprazole 40 MG tablet   Commonly known as: PROTONIX   Take 40 mg by mouth daily.      potassium chloride SA 20 MEQ tablet   Commonly known as: K-DUR,KLOR-CON   Take two tablets twice daily      warfarin 2 MG tablet   Commonly known as: COUMADIN   Take 2-3.5 mg by mouth daily. .  Take one and a half tabs daily (3.5mg ) except for Monday take one tab (2mg )            Disposition   Discharge Orders    Future Appointments: Provider: Department: Dept Phone: Center:    06/01/2011 11:45 AM [facility]  None   06/05/2011 2:15 PM [facility] [phone] None   06/15/2011 4:00 PM Lewayne Bunting, MD [facility] [place] [phone] [facility]   08/07/2011 1:45 PM Hillis Range, MD [facility] [phone] [facility]  Future Orders Please Complete By Expires   Diet - low sodium heart healthy      Increase activity slowly      Discharge instructions      Comments:   **PLEASE REMEMBER TO BRING ALL OF YOUR MEDICATIONS TO EACH OF YOUR FOLLOW-UP OFFICE VISITS.  You have a Stress Test scheduled today 06/01/11 11:45 at [facility] in [place]. Your doctor has ordered this test to get a better idea of how your heart works.  Please arrive 15 minutes early for paperwork.   Location: [address], [address] [place], [place] [address] [phone]  Instructions: No food/drink after midnight the night before.  No caffeine/decaf products 24 hours before, including medicines such as Excedrin or Goody Powders. Call if there are any questions.  It is OK to take your morning meds with a sip of water EXCEPT for those types of medicines listed below or otherwise instructed.  Special Medication Instructions: Beta blockers such as metoprolol (Lopressor/Toprol XL), atenolol (Tenormin), carvedilol (Coreg), nebivolol (Bystolic), propranolol (Inderal) should not be taken for 24 hours before the test. Calcium channel blockers such as diltiazem (Cardizem) or verapmil (Calan) should not be taken for 24 hours before the test. Remove nitroglycerin patches and do not take nitrate preparations such as Imdur/isosorbide the day of your test. No Persantine/Theophylline or Aggrenox medicines should be used within 24 hours of the test.   What To Expect: The whole test will take several hours. When you arrive in the lab, the technician will inject a small amount of radioactive tracer into your arm  through an IV while you are resting quietly. This helps [place] to form pictures of your heart. After a waiting period, resting pictures will be obtained under a big camera. These are the "before" pictures.  Next, you will be prepped for the stress portion of the test. This will include receiving an IV medicine that helps to dilate blood vessels in your heart to simulate the effect of exercise on your heart. The medication may cause temporary nausea, flushing, shortness of breath and sometimes chest discomfort or vomiting. This is typically short-lived and usually resolves quickly. Your blood pressure and heart rate will be monitored, and we will be watching your EKG on a computer screen for any changes. During this portion of the test, the radiologist will inject another small amount of radioactive tracer into your IV. After a waiting period, you will undergo a second set of pictures. These are the "after" pictures.  The doctor reading the test will compare the before-and-after images to look for evidence of heart blockages or heart weakness.      Follow-up Information    Follow up with [facility] on 06/01/2011. (Stress Test at 11:45)    Contact information:   [facility] [address] [place] [address] [phone]      Follow up with [facility] on 06/05/2011. (2:15)    Contact information:   [facility] [address] [place] [address] [phone]      Follow up with [name], MD on 06/15/2011. (4:00)    Contact information:   [facility] [address] [place], [address] [place] [address] [phone]       Follow up with [name], MD. (As needed)    Contact information:   [address], [address] [place], [place]. [place] [address] [phone]  Outstanding Labs/Studies:  1. Lexiscan Myoview 2. INR  Duration  of Discharge Encounter: Greater than 30 minutes including physician and PA time.  Signed, Dianah Pruett PA-C 06/01/2011, 10:40 AM

## 2011-06-01 NOTE — Care Management Note (Signed)
    Page 1 of 1   06/01/2011     10:55:58 AM   CARE MANAGEMENT NOTE 06/01/2011  Patient:  Tracey [name], Tracey [name]   Account Number:  [id number]  Date Initiated:  06/01/2011  Documentation initiated by:  GRAVES-BIGELOW,Kimmora Risenhoover  Subjective/Objective Assessment:   Pt admitted with cp. Plan for home today.     Action/Plan:   No needs from CM at this time.   Anticipated DC Date:  05/25/2011   Anticipated DC Plan:  HOME/SELF CARE      DC Planning Services  CM consult      Choice offered to / List presented to:             Status of service:  Completed, signed off Medicare Important Message given?   (If response is "NO", the following Medicare IM given date fields will be blank) Date Medicare IM given:   Date Additional Medicare IM given:    Discharge Disposition:  HOME/SELF CARE  Per UR Regulation:    If discussed at Long Length of Stay Meetings, dates discussed:    Comments:

## 2011-06-01 NOTE — Progress Notes (Addendum)
@   Subjective:  Denies CP or dyspnea   Objective:  Filed Vitals:   05/31/11 1754 05/31/11 2043 05/31/11 2122 06/01/11 0511  BP: 159/80 142/72 152/71 127/72  Pulse: 60 62 61 60  Temp: 98.5 F (36.9 C) 97.7 F (36.5 C)  98 F (36.7 C)  TempSrc: Oral Oral  Oral  Resp: 18 18  16   Height: 5\' 4"  (1.626 m)     Weight: 65.318 kg (144 lb)     SpO2: 97% 96%  94%    Intake/Output from previous day: No intake or output data in the 24 hours ending 06/01/11 1610  Physical Exam: Physical exam: Well-developed frail in no acute distress.  Skin is warm and dry.  HEENT is normal.  Neck is supple.  Chest is clear to auscultation with normal expansion.  Cardiovascular exam is regular rate and rhythm.  Abdominal exam nontender or distended. No masses palpated. Extremities show no edema. neuro grossly intact    Lab Results: Basic Metabolic Panel:  Basename 06/01/11 0550 05/31/11 1821 05/31/11 1107  NA 136 -- 135  K 3.3* -- 3.3*  CL 98 -- 97  CO2 26 -- 26  GLUCOSE 85 -- 88  BUN 42* -- 46*  CREATININE 1.61* -- 1.66*  CALCIUM 8.6 -- 9.1  MG -- 2.1 --  PHOS -- -- --   CBC:  Basename 05/31/11 1107  WBC 7.5  NEUTROABS 4.9  HGB 12.8  HCT 37.9  MCV 92.0  PLT 149*   Cardiac Enzymes:  Basename 06/01/11 0550 05/31/11 2323 05/31/11 1821  CKTOTAL 43 61 50  CKMB 1.5 1.4 1.6  CKMBINDEX -- -- --  TROPONINI <0.30 <0.30 <0.30     Assessment/Plan:  1) CP; enzymes neg; may have been GI related; plan outpatient myoview; would like to treat conservatively if possible due to age, renal insuff., etc. 2) Atrial fibrillation - continue beta blocker and coumadin 3) Hypertension - continue present BP meds 4) CAD - Continue statin; not on ASA given need for coumadin. 5) CKD stage IV - Follow up nephrology 6) Hypokalemia-supplement K >30 min PA and physician time D2 FU with me in 2-4 weeks  [name] 06/01/2011, 7:28 AM

## 2011-06-01 NOTE — Progress Notes (Signed)
[facility] SITE 3 NUCLEAR MED [address] [place] [address] [phone]  Cardiology Nuclear Med Study  Tracey [name] is a 78 y.o. female     MRN : [id number]     DOB: [birth year]  Procedure Date: 06/01/2011  Nuclear Med Background Indication for Stress Test:  Evaluation for Ischemia, Stent Patency, PTCA Patency and 05/31/11 Post Hospital: Chest Pain History:  '07 MI-Stents-RCAx2,'08 Angioplasty in stented RCA, Asthma, Heart Cath: EF: N/O CAD 50-55% 70% RCA- PTCA, '09 Pacemaker tachy-brady syndrome, '10 MPS: EF: 57%, 11/12 ECHO: 50-55% mild AR, mod MR, TR -mod severe, AFIB, Asthma Cardiac Risk Factors: Family History - CAD, Hypertension and Lipids  Symptoms:  Chest Pain and Nausea   Nuclear Pre-Procedure Caffeine/Decaff Intake:  None NPO After: 10:00am   Lungs:  clear O2 Sat: 96% on room air. IV 0.9% NS with Angio Cath:  20g  IV Site: L Antecubital  IV Started by:  Hospital staff  Chest Size (in):  36 Cup Size: C  Height: 5\' 4"  (1.626 m)  Weight:  142 lb (64.411 kg)  BMI:  Body mass index is 24.37 kg/(m^2). Tech Comments:  Meds were taken as directed, per patient. This patient had diarrhea while she was here and also had Lexiscan so she was reversed after her test, to prevent more diarrhea. IV Aminophylline 75 mg.    Nuclear Med Study 1 or 2 day study: 1 day  Stress Test Type:  Eugenie Birks  Reading MD: Marca Ancona, MD  Order Authorizing Provider:  B.Crenshaw  Resting Radionuclide: Technetium 34m Tetrofosmin  Resting Radionuclide Dose: 11.0 mCi   Stress Radionuclide:  Technetium 41m Tetrofosmin  Stress Radionuclide Dose: 33.0 mCi           Stress Protocol Rest HR: 72 Stress HR: 89  Rest BP: 134/98 Stress BP: 144/78  Exercise Time (min): n/a METS: n/a   Predicted Max HR: 134 bpm % Max HR: 66.42 bpm Rate Pressure Product: 63875   Dose of Adenosine (mg):  n/a Dose of Lexiscan: 0.4 mg  Dose of Atropine (mg): n/a Dose of Dobutamine: n/a mcg/kg/min  (at max HR)  Stress Test Technologist: Milana Na, EMT-P  Nuclear Technologist:  Leonia Corona, RT-N     Rest Procedure:  Myocardial perfusion imaging was performed at rest 45 minutes following the intravenous administration of Technetium 30m Tetrofosmin. Rest ECG: Atrial Fibrilliation  Stress Procedure:  The patient received IV Lexiscan 0.4 mg over 15-seconds.  Technetium 56m Tetrofosmin injected at 30-seconds.  There were no significant changes with Lexiscan and rare pvc.  Quantitative spect images were obtained after a 45 minute delay. Stress ECG: No significant change from baseline ECG  QPS Raw Data Images:  Normal; no motion artifact; normal heart/lung ratio. Stress Images:  Normal homogeneous uptake in all areas of the myocardium. Rest Images:  Normal homogeneous uptake in all areas of the myocardium. Subtraction (SDS):  There is no evidence of scar or ischemia. Transient Ischemic Dilatation (Normal <1.22):  1.10 Lung/Heart Ratio (Normal <0.45):  .36  Quantitative Gated Spect Images Study not gated due to Atrial Fibrillation  Impression Exercise Capacity:  Lexiscan with no exercise. BP Response:  Normal blood pressure response. Clinical Symptoms:  "Funny in the head" ECG Impression:  Atrial fibrillation with profound inferior and anterolateral T wave inversions at baseline.  No change with infusion.  Comparison with Prior Nuclear Study: No images to compare  Overall Impression:  Normal stress nuclear study.  LV Ejection Fraction: Study not gated.  LV Wall Motion:  N/A  Marca Ancona

## 2011-06-01 NOTE — Progress Notes (Signed)
Nutrition Brief Note  RD pulled to patient regarding unintentional weight loss > 10 lbs within the past month and problems chewing or swallowing foods and/or liquids per admission nutrition screen. Patient confirms this weight loss was in fact intentional.  Also reports her throat sometimes "closes up" however this does not affect her chewing/swallowing of foods and liquids. BMI = 24.8 kg/m2. No nutrition intervention warranted at this time. Please consult RD as needed.  Tracey [name] Pager #: [phone]
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
## 2011-06-05 NOTE — ED Provider Notes (Signed)
Medical screening examination/treatment/procedure(s) were performed by non-physician practitioner and as supervising physician I was immediately available for consultation/collaboration.  Raeford Razor, MD 06/05/11 1012

## 2011-06-15 ENCOUNTER — Ambulatory Visit (INDEPENDENT_AMBULATORY_CARE_PROVIDER_SITE_OTHER): Payer: Medicare Other

## 2011-06-15 ENCOUNTER — Encounter: Payer: Self-pay | Admitting: Cardiology

## 2011-06-15 ENCOUNTER — Ambulatory Visit (INDEPENDENT_AMBULATORY_CARE_PROVIDER_SITE_OTHER): Payer: Medicare Other | Admitting: Cardiology

## 2011-06-15 VITALS — BP 138/83 | HR 64 | Wt 143.0 lb

## 2011-06-15 DIAGNOSIS — I10 Essential (primary) hypertension: Secondary | ICD-10-CM

## 2011-06-15 DIAGNOSIS — I4891 Unspecified atrial fibrillation: Secondary | ICD-10-CM

## 2011-06-15 DIAGNOSIS — Z7901 Long term (current) use of anticoagulants: Secondary | ICD-10-CM

## 2011-06-15 DIAGNOSIS — N184 Chronic kidney disease, stage 4 (severe): Secondary | ICD-10-CM

## 2011-06-15 DIAGNOSIS — I2510 Atherosclerotic heart disease of native coronary artery without angina pectoris: Secondary | ICD-10-CM

## 2011-06-15 DIAGNOSIS — E785 Hyperlipidemia, unspecified: Secondary | ICD-10-CM

## 2011-06-15 DIAGNOSIS — Z950 Presence of cardiac pacemaker: Secondary | ICD-10-CM

## 2011-06-15 LAB — POCT INR: INR: 2.1

## 2011-06-15 NOTE — Assessment & Plan Note (Signed)
Management per electrophysiology. 

## 2011-06-15 NOTE — Assessment & Plan Note (Signed)
Continue beta blocker and Coumadin. 

## 2011-06-15 NOTE — Assessment & Plan Note (Signed)
Blood pressure controlled. Continue present medications. 

## 2011-06-15 NOTE — Assessment & Plan Note (Signed)
Followed by nephrology. 

## 2011-06-15 NOTE — Assessment & Plan Note (Signed)
Continue statin. Not on aspirin because of need for Coumadin.

## 2011-06-15 NOTE — Assessment & Plan Note (Signed)
Continue statin. 
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
## 2011-06-15 NOTE — Patient Instructions (Signed)
Your physician wants you to follow-up in: 6 MONTHS WITH DR CRENSHAW You will receive a reminder letter in the mail two months in advance. If you don't receive a letter, please call our office to schedule the follow-up appointment.  

## 2011-06-16 ENCOUNTER — Other Ambulatory Visit: Payer: Self-pay | Admitting: Cardiology

## 2011-07-11 ENCOUNTER — Other Ambulatory Visit: Payer: Self-pay | Admitting: Cardiology

## 2011-07-13 ENCOUNTER — Ambulatory Visit (INDEPENDENT_AMBULATORY_CARE_PROVIDER_SITE_OTHER): Payer: Medicare Other | Admitting: *Deleted

## 2011-07-13 DIAGNOSIS — Z7901 Long term (current) use of anticoagulants: Secondary | ICD-10-CM

## 2011-07-13 DIAGNOSIS — I4891 Unspecified atrial fibrillation: Secondary | ICD-10-CM

## 2011-07-13 LAB — POCT INR: INR: 1.6

## 2011-07-26 ENCOUNTER — Ambulatory Visit (INDEPENDENT_AMBULATORY_CARE_PROVIDER_SITE_OTHER): Payer: Medicare Other | Admitting: *Deleted

## 2011-07-26 DIAGNOSIS — I4891 Unspecified atrial fibrillation: Secondary | ICD-10-CM

## 2011-07-26 DIAGNOSIS — Z7901 Long term (current) use of anticoagulants: Secondary | ICD-10-CM

## 2011-07-26 LAB — POCT INR: INR: 2.1

## 2011-08-07 ENCOUNTER — Encounter: Payer: Self-pay | Admitting: Internal Medicine

## 2011-08-07 ENCOUNTER — Ambulatory Visit (INDEPENDENT_AMBULATORY_CARE_PROVIDER_SITE_OTHER): Payer: Medicare Other

## 2011-08-07 ENCOUNTER — Ambulatory Visit (INDEPENDENT_AMBULATORY_CARE_PROVIDER_SITE_OTHER): Payer: Medicare Other | Admitting: Internal Medicine

## 2011-08-07 ENCOUNTER — Encounter (INDEPENDENT_AMBULATORY_CARE_PROVIDER_SITE_OTHER): Payer: Medicare Other | Admitting: *Deleted

## 2011-08-07 VITALS — BP 120/70 | HR 67 | Resp 18 | Ht 64.0 in | Wt 144.0 lb

## 2011-08-07 DIAGNOSIS — Z7901 Long term (current) use of anticoagulants: Secondary | ICD-10-CM

## 2011-08-07 DIAGNOSIS — I4891 Unspecified atrial fibrillation: Secondary | ICD-10-CM

## 2011-08-07 DIAGNOSIS — I495 Sick sinus syndrome: Secondary | ICD-10-CM

## 2011-08-07 DIAGNOSIS — R0989 Other specified symptoms and signs involving the circulatory and respiratory systems: Secondary | ICD-10-CM

## 2011-08-07 LAB — POCT INR: INR: 2.8
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
## 2011-08-07 NOTE — Assessment & Plan Note (Signed)
Normal pacemaker function See Tracey [name] report  Given permanent afib, I have programmed device VVIR today

## 2011-08-07 NOTE — Assessment & Plan Note (Signed)
Permanent afib Rate controlled Continue coumadin long term 

## 2011-08-07 NOTE — Patient Instructions (Signed)
Your physician wants you to follow-up in: 6 months in the device clinic and 12 months with Dr Allred You will receive a reminder letter in the mail two months in advance. If you don't receive a letter, please call our office to schedule the follow-up appointment.  

## 2011-08-08 LAB — PACEMAKER DEVICE OBSERVATION
AL AMPLITUDE: 1.3 mv
BAMS-0001: 170 {beats}/min
BRDY-0002RV: 60 {beats}/min
RV LEAD AMPLITUDE: 17.3 mv
RV LEAD IMPEDENCE PM: 384 Ohm

## 2011-08-09 ENCOUNTER — Other Ambulatory Visit: Payer: Self-pay | Admitting: Cardiology

## 2011-08-29 ENCOUNTER — Ambulatory Visit (INDEPENDENT_AMBULATORY_CARE_PROVIDER_SITE_OTHER): Payer: Medicare Other | Admitting: Pharmacist

## 2011-08-29 DIAGNOSIS — Z7901 Long term (current) use of anticoagulants: Secondary | ICD-10-CM

## 2011-08-29 DIAGNOSIS — I4891 Unspecified atrial fibrillation: Secondary | ICD-10-CM

## 2011-09-19 ENCOUNTER — Ambulatory Visit (INDEPENDENT_AMBULATORY_CARE_PROVIDER_SITE_OTHER): Payer: Medicare Other | Admitting: Pharmacist

## 2011-09-19 DIAGNOSIS — I4891 Unspecified atrial fibrillation: Secondary | ICD-10-CM

## 2011-09-19 DIAGNOSIS — Z7901 Long term (current) use of anticoagulants: Secondary | ICD-10-CM

## 2011-09-19 LAB — POCT INR: INR: 2

## 2011-10-17 ENCOUNTER — Ambulatory Visit (INDEPENDENT_AMBULATORY_CARE_PROVIDER_SITE_OTHER): Payer: Medicare Other | Admitting: *Deleted

## 2011-10-17 DIAGNOSIS — I4891 Unspecified atrial fibrillation: Secondary | ICD-10-CM

## 2011-10-17 DIAGNOSIS — Z7901 Long term (current) use of anticoagulants: Secondary | ICD-10-CM

## 2011-10-17 LAB — POCT INR: INR: 1.7

## 2011-10-23 ENCOUNTER — Other Ambulatory Visit: Payer: Self-pay | Admitting: Internal Medicine

## 2011-10-23 DIAGNOSIS — Z1231 Encounter for screening mammogram for malignant neoplasm of breast: Secondary | ICD-10-CM

## 2011-11-02 ENCOUNTER — Ambulatory Visit (INDEPENDENT_AMBULATORY_CARE_PROVIDER_SITE_OTHER): Payer: Medicare Other | Admitting: *Deleted

## 2011-11-02 DIAGNOSIS — Z7901 Long term (current) use of anticoagulants: Secondary | ICD-10-CM

## 2011-11-02 DIAGNOSIS — I4891 Unspecified atrial fibrillation: Secondary | ICD-10-CM

## 2011-11-08 ENCOUNTER — Other Ambulatory Visit: Payer: Self-pay | Admitting: Cardiology

## 2011-11-13 ENCOUNTER — Ambulatory Visit: Payer: Medicare Other

## 2011-12-12 ENCOUNTER — Encounter: Payer: Self-pay | Admitting: Cardiology

## 2011-12-12 ENCOUNTER — Ambulatory Visit (INDEPENDENT_AMBULATORY_CARE_PROVIDER_SITE_OTHER): Payer: Medicare Other | Admitting: *Deleted

## 2011-12-12 ENCOUNTER — Ambulatory Visit (INDEPENDENT_AMBULATORY_CARE_PROVIDER_SITE_OTHER): Payer: Medicare Other | Admitting: Cardiology

## 2011-12-12 VITALS — BP 142/74 | HR 73 | Ht 64.0 in | Wt 150.0 lb

## 2011-12-12 DIAGNOSIS — I4891 Unspecified atrial fibrillation: Secondary | ICD-10-CM

## 2011-12-12 DIAGNOSIS — I2510 Atherosclerotic heart disease of native coronary artery without angina pectoris: Secondary | ICD-10-CM

## 2011-12-12 DIAGNOSIS — R0609 Other forms of dyspnea: Secondary | ICD-10-CM

## 2011-12-12 DIAGNOSIS — E7800 Pure hypercholesterolemia, unspecified: Secondary | ICD-10-CM

## 2011-12-12 DIAGNOSIS — R0989 Other specified symptoms and signs involving the circulatory and respiratory systems: Secondary | ICD-10-CM

## 2011-12-12 DIAGNOSIS — Z7901 Long term (current) use of anticoagulants: Secondary | ICD-10-CM

## 2011-12-12 LAB — TSH: TSH: 3.72 u[IU]/mL (ref 0.35–5.50)

## 2011-12-12 LAB — BASIC METABOLIC PANEL
BUN: 27 mg/dL — ABNORMAL HIGH (ref 6–23)
Chloride: 98 mEq/L (ref 96–112)
GFR: 24.76 mL/min — ABNORMAL LOW (ref >60.00)
Glucose, Bld: 90 mg/dL (ref 70–99)
Potassium: 4.2 mEq/L (ref 3.5–5.1)
Sodium: 135 mEq/L (ref 135–145)

## 2011-12-12 LAB — PROTIME-INR
INR: 2.3 ratio — ABNORMAL HIGH (ref 0.8–1.0)
Prothrombin Time: 24.2 s — ABNORMAL HIGH (ref 10.2–12.4)

## 2011-12-12 MED ORDER — FUROSEMIDE 80 MG PO TABS: ORAL_TABLET | ORAL | Status: DC

## 2011-12-12 NOTE — Assessment & Plan Note (Signed)
Continue beta blocker and Coumadin. 

## 2011-12-12 NOTE — Assessment & Plan Note (Signed)
Continue statin. Check lipids and liver. 

## 2011-12-12 NOTE — Assessment & Plan Note (Signed)
Continue statin. Not on aspirin given need for Coumadin. 

## 2011-12-12 NOTE — Patient Instructions (Addendum)
Your physician recommends that you schedule a follow-up appointment in: 6 WEEKS WITH DR CRENSHAW  Your physician recommends that you return for lab work TODAY AND IN ONE WEEK = FASTING  INCREASE FUROSEMIDE TO 80 MG ONE AND ONE HALF TABLETS EVERY MORNING AND ONE TABLET EVERY AFTERNOON  TAKE EXTRA ONE HALF FUROSEMIDE AS NEEDED FOR SHORTNESS OF BREATH  Your physician has requested that you have an echocardiogram. Echocardiography is a painless test that uses sound waves to create images of your heart. It provides your doctor with information about the size and shape of your heart and how well your heart's chambers and valves are working. This procedure takes approximately one hour. There are no restrictions for this procedure.

## 2011-12-12 NOTE — Assessment & Plan Note (Signed)
Followed by nephrology. 

## 2011-12-12 NOTE — Assessment & Plan Note (Signed)
Patient appears to have acute on chronic diastolic congestive heart failure. Increase Lasix to 120 mg in the morning and 80 mg in the evening. Take an additional 40 mg daily as needed. Check potassium, renal function and BNP. Repeat potassium and renal function in one week. Repeat echocardiogram.

## 2011-12-12 NOTE — Assessment & Plan Note (Signed)
Management per electrophysiology. 

## 2011-12-12 NOTE — Assessment & Plan Note (Signed)
Blood pressure controlled. Continue present medications. 
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
## 2011-12-19 ENCOUNTER — Ambulatory Visit (HOSPITAL_COMMUNITY): Payer: Medicare Other | Attending: Cardiology

## 2011-12-19 ENCOUNTER — Other Ambulatory Visit (INDEPENDENT_AMBULATORY_CARE_PROVIDER_SITE_OTHER): Payer: Medicare Other

## 2011-12-19 DIAGNOSIS — I4891 Unspecified atrial fibrillation: Secondary | ICD-10-CM

## 2011-12-19 DIAGNOSIS — R0989 Other specified symptoms and signs involving the circulatory and respiratory systems: Secondary | ICD-10-CM | POA: Insufficient documentation

## 2011-12-19 DIAGNOSIS — I2510 Atherosclerotic heart disease of native coronary artery without angina pectoris: Secondary | ICD-10-CM

## 2011-12-19 DIAGNOSIS — I359 Nonrheumatic aortic valve disorder, unspecified: Secondary | ICD-10-CM | POA: Insufficient documentation

## 2011-12-19 DIAGNOSIS — I517 Cardiomegaly: Secondary | ICD-10-CM | POA: Insufficient documentation

## 2011-12-19 DIAGNOSIS — N189 Chronic kidney disease, unspecified: Secondary | ICD-10-CM | POA: Insufficient documentation

## 2011-12-19 DIAGNOSIS — R0609 Other forms of dyspnea: Secondary | ICD-10-CM | POA: Insufficient documentation

## 2011-12-19 DIAGNOSIS — E7800 Pure hypercholesterolemia, unspecified: Secondary | ICD-10-CM

## 2011-12-19 DIAGNOSIS — I059 Rheumatic mitral valve disease, unspecified: Secondary | ICD-10-CM | POA: Insufficient documentation

## 2011-12-19 LAB — HEPATIC FUNCTION PANEL
Albumin: 3.6 g/dL (ref 3.5–5.2)
Total Protein: 7 g/dL (ref 6.0–8.3)

## 2011-12-19 LAB — BASIC METABOLIC PANEL
GFR: 24.48 mL/min — ABNORMAL LOW (ref >60.00)
Potassium: 3.6 mEq/L (ref 3.5–5.1)
Sodium: 135 mEq/L (ref 135–145)

## 2011-12-19 LAB — LIPID PANEL
Cholesterol: 82 mg/dL (ref 0–200)
HDL: 25.7 mg/dL — ABNORMAL LOW (ref >39.00)
VLDL: 46 mg/dL — ABNORMAL HIGH (ref 0.0–40.0)

## 2011-12-19 NOTE — Progress Notes (Signed)
Echocardiogram performed.  

## 2011-12-20 LAB — LDL CHOLESTEROL, DIRECT: Direct LDL: 29.2 mg/dL

## 2012-01-01 ENCOUNTER — Other Ambulatory Visit: Payer: Self-pay | Admitting: Cardiology

## 2012-01-05 ENCOUNTER — Encounter (HOSPITAL_COMMUNITY): Payer: Self-pay | Admitting: *Deleted

## 2012-01-05 ENCOUNTER — Emergency Department (HOSPITAL_COMMUNITY): Payer: Medicare Other

## 2012-01-05 ENCOUNTER — Emergency Department (HOSPITAL_COMMUNITY)
Admission: EM | Admit: 2012-01-05 | Discharge: 2012-01-05 | Disposition: A | Discharge status: Home / Self Care | Payer: Medicare Other | Attending: Emergency Medicine | Admitting: Emergency Medicine

## 2012-01-05 DIAGNOSIS — Y92410 Unspecified street and highway as the place of occurrence of the external cause: Secondary | ICD-10-CM | POA: Insufficient documentation

## 2012-01-05 DIAGNOSIS — K219 Gastro-esophageal reflux disease without esophagitis: Secondary | ICD-10-CM | POA: Insufficient documentation

## 2012-01-05 DIAGNOSIS — Y939 Activity, unspecified: Secondary | ICD-10-CM | POA: Insufficient documentation

## 2012-01-05 DIAGNOSIS — N184 Chronic kidney disease, stage 4 (severe): Secondary | ICD-10-CM | POA: Insufficient documentation

## 2012-01-05 DIAGNOSIS — H543 Unqualified visual loss, both eyes: Secondary | ICD-10-CM | POA: Insufficient documentation

## 2012-01-05 DIAGNOSIS — I2510 Atherosclerotic heart disease of native coronary artery without angina pectoris: Secondary | ICD-10-CM | POA: Insufficient documentation

## 2012-01-05 DIAGNOSIS — R259 Unspecified abnormal involuntary movements: Secondary | ICD-10-CM | POA: Insufficient documentation

## 2012-01-05 DIAGNOSIS — S0993XA Unspecified injury of face, initial encounter: Secondary | ICD-10-CM | POA: Insufficient documentation

## 2012-01-05 DIAGNOSIS — R42 Dizziness and giddiness: Secondary | ICD-10-CM | POA: Insufficient documentation

## 2012-01-05 DIAGNOSIS — I129 Hypertensive chronic kidney disease with stage 1 through stage 4 chronic kidney disease, or unspecified chronic kidney disease: Secondary | ICD-10-CM | POA: Insufficient documentation

## 2012-01-05 DIAGNOSIS — Z862 Personal history of diseases of the blood and blood-forming organs and certain disorders involving the immune mechanism: Secondary | ICD-10-CM | POA: Insufficient documentation

## 2012-01-05 DIAGNOSIS — Z8679 Personal history of other diseases of the circulatory system: Secondary | ICD-10-CM | POA: Insufficient documentation

## 2012-01-05 DIAGNOSIS — F411 Generalized anxiety disorder: Secondary | ICD-10-CM | POA: Insufficient documentation

## 2012-01-05 DIAGNOSIS — Z8719 Personal history of other diseases of the digestive system: Secondary | ICD-10-CM | POA: Insufficient documentation

## 2012-01-05 DIAGNOSIS — S199XXA Unspecified injury of neck, initial encounter: Secondary | ICD-10-CM | POA: Insufficient documentation

## 2012-01-05 DIAGNOSIS — Z8739 Personal history of other diseases of the musculoskeletal system and connective tissue: Secondary | ICD-10-CM | POA: Insufficient documentation

## 2012-01-05 DIAGNOSIS — Z8639 Personal history of other endocrine, nutritional and metabolic disease: Secondary | ICD-10-CM | POA: Insufficient documentation

## 2012-01-05 DIAGNOSIS — E785 Hyperlipidemia, unspecified: Secondary | ICD-10-CM | POA: Insufficient documentation

## 2012-01-05 DIAGNOSIS — I10 Essential (primary) hypertension: Secondary | ICD-10-CM | POA: Insufficient documentation

## 2012-01-05 DIAGNOSIS — R791 Abnormal coagulation profile: Secondary | ICD-10-CM | POA: Insufficient documentation

## 2012-01-05 DIAGNOSIS — Z7901 Long term (current) use of anticoagulants: Secondary | ICD-10-CM | POA: Insufficient documentation

## 2012-01-05 LAB — CBC WITH DIFFERENTIAL/PLATELET
Basophils Relative: 0 % (ref 0–1)
Eosinophils Absolute: 0.1 10*3/uL (ref 0.0–0.7)
Eosinophils Relative: 2 % (ref 0–5)
Hemoglobin: 12.2 g/dL (ref 12.0–15.0)
Lymphs Abs: 1 10*3/uL (ref 0.7–4.0)
MCH: 32.9 pg (ref 26.0–34.0)
MCHC: 33.4 g/dL (ref 30.0–36.0)
MCV: 98.4 fL (ref 78.0–100.0)
Monocytes Relative: 12 % (ref 3–12)
Neutrophils Relative %: 70 % (ref 43–77)
Platelets: 160 10*3/uL (ref 150–400)
RBC: 3.71 MIL/uL — ABNORMAL LOW (ref 3.87–5.11)

## 2012-01-05 LAB — BASIC METABOLIC PANEL
BUN: 32 mg/dL — ABNORMAL HIGH (ref 6–23)
Calcium: 9.7 mg/dL (ref 8.4–10.5)
GFR calc Af Amer: 24 mL/min — ABNORMAL LOW (ref >90)
GFR calc non Af Amer: 21 mL/min — ABNORMAL LOW (ref >90)
Glucose, Bld: 87 mg/dL (ref 70–99)
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
## 2012-01-08 ENCOUNTER — Other Ambulatory Visit: Payer: Self-pay | Admitting: Cardiology

## 2012-01-09 ENCOUNTER — Ambulatory Visit (INDEPENDENT_AMBULATORY_CARE_PROVIDER_SITE_OTHER): Payer: Medicare Other | Admitting: Pharmacist

## 2012-01-09 DIAGNOSIS — Z7901 Long term (current) use of anticoagulants: Secondary | ICD-10-CM

## 2012-01-09 DIAGNOSIS — I4891 Unspecified atrial fibrillation: Secondary | ICD-10-CM

## 2012-01-12 ENCOUNTER — Other Ambulatory Visit: Payer: Self-pay | Admitting: Cardiology

## 2012-01-25 ENCOUNTER — Ambulatory Visit
Admission: RE | Admit: 2012-01-25 | Discharge: 2012-01-25 | Disposition: A | Discharge status: Home / Self Care | Payer: Medicare Other | Source: Ambulatory Visit | Attending: Internal Medicine | Admitting: Internal Medicine

## 2012-01-25 DIAGNOSIS — Z1231 Encounter for screening mammogram for malignant neoplasm of breast: Secondary | ICD-10-CM

## 2012-01-30 ENCOUNTER — Encounter: Payer: Self-pay | Admitting: Cardiology

## 2012-01-30 ENCOUNTER — Encounter: Payer: Self-pay | Admitting: Internal Medicine

## 2012-01-30 ENCOUNTER — Ambulatory Visit (INDEPENDENT_AMBULATORY_CARE_PROVIDER_SITE_OTHER): Payer: Medicare Other | Admitting: Cardiology

## 2012-01-30 VITALS — BP 143/78 | HR 81 | Resp 18 | Ht 66.0 in | Wt 151.0 lb

## 2012-01-30 DIAGNOSIS — I4891 Unspecified atrial fibrillation: Secondary | ICD-10-CM

## 2012-01-30 DIAGNOSIS — I495 Sick sinus syndrome: Secondary | ICD-10-CM

## 2012-01-30 DIAGNOSIS — Z950 Presence of cardiac pacemaker: Secondary | ICD-10-CM

## 2012-01-30 LAB — PACEMAKER DEVICE OBSERVATION
AL IMPEDENCE PM: 448 Ohm
BATTERY VOLTAGE: 2.95 V
BRDY-0003RV: 120 {beats}/min
BRDY-0004RV: 120 {beats}/min
RV LEAD IMPEDENCE PM: 376 Ohm
RV LEAD THRESHOLD: 1 V
VENTRICULAR PACING PM: 68.3
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
## 2012-01-30 NOTE — Patient Instructions (Signed)
Your physician wants you to follow-up in: 6 months with Dr. Allred. You will receive a reminder letter in the mail two months in advance. If you don't receive a letter, please call our office to schedule the follow-up appointment.  

## 2012-02-07 ENCOUNTER — Telehealth: Payer: Self-pay | Admitting: Physician Assistant

## 2012-02-07 ENCOUNTER — Ambulatory Visit (INDEPENDENT_AMBULATORY_CARE_PROVIDER_SITE_OTHER): Payer: Medicare Other | Admitting: *Deleted

## 2012-02-07 DIAGNOSIS — Z7901 Long term (current) use of anticoagulants: Secondary | ICD-10-CM

## 2012-02-07 DIAGNOSIS — I4891 Unspecified atrial fibrillation: Secondary | ICD-10-CM

## 2012-02-07 LAB — POCT INR: INR: 2.2

## 2012-02-07 NOTE — Telephone Encounter (Signed)
Ms. Santellan called the answering service re: problems filling her Coumadin prescription. Her insurance recently changed and she had to switch pharmacies. When trying to obtain her Coumadin today, she was told she would need a new prescription. She also is running low on atorvastatin and metoprolol. Called and requested refills at her new pharmacy ([facility] on [address] She will be called when these are ready to be picked up.    Jacqulyn Bath, PA-C 02/07/2012 9:12 PM

## 2012-02-08 ENCOUNTER — Other Ambulatory Visit: Payer: Self-pay | Admitting: Cardiology

## 2012-02-09 ENCOUNTER — Ambulatory Visit: Payer: Medicare Other | Admitting: Cardiology

## 2012-02-26 ENCOUNTER — Telehealth: Payer: Self-pay | Admitting: Pharmacist

## 2012-02-26 ENCOUNTER — Telehealth: Payer: Self-pay | Admitting: Cardiology

## 2012-02-26 NOTE — Telephone Encounter (Signed)
New problem:   Pharmacy was changed due to insurance .   Patient running out. Please clarify dosage & direction    Warfarin suppose  2 mg . 1/2 tab a day.  Pharmacy  Filled was 3 mg.  Bottle  Says  Take one a day.

## 2012-02-26 NOTE — Telephone Encounter (Signed)
Pt changed pharmacies recently and new RX sent in.  Previous RX warfarin 2mg  tab and she took 1.5tab daily per [facility] notes.  New RX sent in Warfarin 3mg  tabs with directions take 1 tab daily - she did not realize new strength or read bottle but continued her 1.5 tabs daily.  Her last INR stable at 2.2 but given change we will see her sooner 2/6 when she sees Dr. Jens Som.  She has no bleeding.  She is instructed to decrease Coumadin dose to 1 tab daily until checked by [facility]

## 2012-02-29 ENCOUNTER — Ambulatory Visit (INDEPENDENT_AMBULATORY_CARE_PROVIDER_SITE_OTHER): Payer: Medicare Other

## 2012-02-29 ENCOUNTER — Ambulatory Visit (INDEPENDENT_AMBULATORY_CARE_PROVIDER_SITE_OTHER): Payer: Medicare Other | Admitting: Cardiology

## 2012-02-29 ENCOUNTER — Encounter: Payer: Self-pay | Admitting: Cardiology

## 2012-02-29 VITALS — BP 120/80 | HR 67 | Wt 151.0 lb

## 2012-02-29 DIAGNOSIS — Z7901 Long term (current) use of anticoagulants: Secondary | ICD-10-CM

## 2012-02-29 DIAGNOSIS — N184 Chronic kidney disease, stage 4 (severe): Secondary | ICD-10-CM

## 2012-02-29 DIAGNOSIS — E049 Nontoxic goiter, unspecified: Secondary | ICD-10-CM | POA: Insufficient documentation

## 2012-02-29 DIAGNOSIS — I4891 Unspecified atrial fibrillation: Secondary | ICD-10-CM

## 2012-02-29 DIAGNOSIS — I10 Essential (primary) hypertension: Secondary | ICD-10-CM

## 2012-02-29 DIAGNOSIS — E785 Hyperlipidemia, unspecified: Secondary | ICD-10-CM

## 2012-02-29 DIAGNOSIS — Z950 Presence of cardiac pacemaker: Secondary | ICD-10-CM

## 2012-02-29 DIAGNOSIS — I2510 Atherosclerotic heart disease of native coronary artery without angina pectoris: Secondary | ICD-10-CM

## 2012-02-29 MED ORDER — WARFARIN SODIUM 3 MG PO TABS: ORAL_TABLET | ORAL | Status: DC

## 2012-02-29 NOTE — Assessment & Plan Note (Signed)
Followed by electrophysiology. 

## 2012-02-29 NOTE — Patient Instructions (Addendum)
Your physician wants you to follow-up in: 6 MONTHS WITH DR Jens Som You will receive a reminder letter in the mail two months in advance. If you don't receive a letter, please call our office to schedule the follow-up appointment.   Your physician recommends that you HAVE LAB WORK TODAY  ULTRASOUND OF NECK-ENLARGED THYROID

## 2012-02-29 NOTE — Assessment & Plan Note (Signed)
Continue statin. Not on aspirin given need for Coumadin. 

## 2012-02-29 NOTE — Assessment & Plan Note (Signed)
Continue present blood pressure medications. 
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
## 2012-02-29 NOTE — Assessment & Plan Note (Signed)
Followed by nephrology. 

## 2012-02-29 NOTE — Assessment & Plan Note (Signed)
Recent x-rays suggest thyroid enlargement. Check TSH and thyroid ultrasound.

## 2012-02-29 NOTE — Assessment & Plan Note (Signed)
Continue statin. 

## 2012-02-29 NOTE — Assessment & Plan Note (Signed)
Continue Coumadin and beta blocker. 

## 2012-03-04 ENCOUNTER — Other Ambulatory Visit (HOSPITAL_COMMUNITY): Payer: Medicare Other

## 2012-03-05 ENCOUNTER — Ambulatory Visit (HOSPITAL_COMMUNITY)
Admission: RE | Admit: 2012-03-05 | Discharge: 2012-03-05 | Disposition: A | Discharge status: Home / Self Care | Payer: Medicare Other | Source: Ambulatory Visit | Attending: Cardiology | Admitting: Cardiology

## 2012-03-05 DIAGNOSIS — E049 Nontoxic goiter, unspecified: Secondary | ICD-10-CM | POA: Insufficient documentation

## 2012-03-06 ENCOUNTER — Other Ambulatory Visit: Payer: Self-pay | Admitting: *Deleted

## 2012-03-06 DIAGNOSIS — E041 Nontoxic single thyroid nodule: Secondary | ICD-10-CM

## 2012-03-06 NOTE — Telephone Encounter (Signed)
Spoke with pt, questions regarding thyroid biopsy answered.

## 2012-03-06 NOTE — Telephone Encounter (Signed)
New Problem   Calling in wanting to speak nurse regarding her thyroid. States she has some questions.

## 2012-03-13 ENCOUNTER — Ambulatory Visit
Admission: RE | Admit: 2012-03-13 | Discharge: 2012-03-13 | Disposition: A | Discharge status: Home / Self Care | Payer: Medicare Other | Source: Ambulatory Visit | Attending: Cardiology | Admitting: Cardiology

## 2012-03-13 ENCOUNTER — Other Ambulatory Visit (HOSPITAL_COMMUNITY)
Admission: RE | Admit: 2012-03-13 | Discharge: 2012-03-13 | Disposition: A | Discharge status: Home / Self Care | Payer: Medicare Other | Source: Ambulatory Visit | Attending: Diagnostic Radiology | Admitting: Diagnostic Radiology

## 2012-03-13 ENCOUNTER — Ambulatory Visit (INDEPENDENT_AMBULATORY_CARE_PROVIDER_SITE_OTHER): Payer: Medicare Other | Admitting: *Deleted

## 2012-03-13 DIAGNOSIS — E041 Nontoxic single thyroid nodule: Secondary | ICD-10-CM

## 2012-03-13 DIAGNOSIS — E049 Nontoxic goiter, unspecified: Secondary | ICD-10-CM | POA: Insufficient documentation

## 2012-03-13 DIAGNOSIS — Z7901 Long term (current) use of anticoagulants: Secondary | ICD-10-CM

## 2012-03-13 DIAGNOSIS — I4891 Unspecified atrial fibrillation: Secondary | ICD-10-CM

## 2012-03-13 LAB — POCT INR: INR: 1.1

## 2012-03-18 ENCOUNTER — Telehealth: Payer: Self-pay | Admitting: Cardiology

## 2012-03-18 NOTE — Telephone Encounter (Signed)
N.A.X1

## 2012-03-18 NOTE — Telephone Encounter (Signed)
New  Problem    Returning call back to nurse .

## 2012-03-19 NOTE — Telephone Encounter (Signed)
Spoke with pt, cytology  Regarding thyroid discussed.

## 2012-03-22 ENCOUNTER — Ambulatory Visit (INDEPENDENT_AMBULATORY_CARE_PROVIDER_SITE_OTHER): Payer: Medicare Other | Admitting: *Deleted

## 2012-03-22 ENCOUNTER — Telehealth: Payer: Self-pay | Admitting: *Deleted

## 2012-03-22 DIAGNOSIS — Z7901 Long term (current) use of anticoagulants: Secondary | ICD-10-CM

## 2012-03-22 DIAGNOSIS — I4891 Unspecified atrial fibrillation: Secondary | ICD-10-CM

## 2012-03-22 LAB — POCT INR: INR: 1.9

## 2012-03-22 NOTE — Telephone Encounter (Signed)
Pt came to office asking to speak to [name] about thyroid Med. Pt would like a call back in regards of what to take or what do

## 2012-03-26 NOTE — Telephone Encounter (Signed)
Spoke with pt, aware the tsh we checked was normal, not sure what if anything else needs to be done. Will forward the ultrasound and the biopsy results to dr griffin her pcp and then she will follow up with him regarding her thyroid. Pt agreed with this plan.

## 2012-04-04 ENCOUNTER — Ambulatory Visit (INDEPENDENT_AMBULATORY_CARE_PROVIDER_SITE_OTHER): Payer: Medicare Other

## 2012-04-04 DIAGNOSIS — Z7901 Long term (current) use of anticoagulants: Secondary | ICD-10-CM

## 2012-04-04 DIAGNOSIS — I4891 Unspecified atrial fibrillation: Secondary | ICD-10-CM

## 2012-04-04 LAB — POCT INR: INR: 2.9

## 2012-04-25 ENCOUNTER — Ambulatory Visit (INDEPENDENT_AMBULATORY_CARE_PROVIDER_SITE_OTHER): Payer: Medicare Other | Admitting: *Deleted

## 2012-04-25 DIAGNOSIS — Z7901 Long term (current) use of anticoagulants: Secondary | ICD-10-CM

## 2012-04-25 DIAGNOSIS — I4891 Unspecified atrial fibrillation: Secondary | ICD-10-CM

## 2012-05-08 ENCOUNTER — Ambulatory Visit (INDEPENDENT_AMBULATORY_CARE_PROVIDER_SITE_OTHER): Payer: Medicare Other | Admitting: *Deleted

## 2012-05-08 ENCOUNTER — Other Ambulatory Visit: Payer: Self-pay | Admitting: Physician Assistant

## 2012-05-08 DIAGNOSIS — I4891 Unspecified atrial fibrillation: Secondary | ICD-10-CM

## 2012-05-08 DIAGNOSIS — Z7901 Long term (current) use of anticoagulants: Secondary | ICD-10-CM

## 2012-05-08 LAB — POCT INR: INR: 1.8

## 2012-05-22 ENCOUNTER — Ambulatory Visit (INDEPENDENT_AMBULATORY_CARE_PROVIDER_SITE_OTHER): Payer: Medicare Other | Admitting: *Deleted

## 2012-05-22 DIAGNOSIS — Z7901 Long term (current) use of anticoagulants: Secondary | ICD-10-CM

## 2012-05-22 DIAGNOSIS — I4891 Unspecified atrial fibrillation: Secondary | ICD-10-CM

## 2012-06-04 ENCOUNTER — Encounter (HOSPITAL_COMMUNITY): Payer: Self-pay | Admitting: Emergency Medicine

## 2012-06-04 ENCOUNTER — Emergency Department (HOSPITAL_COMMUNITY)
Admission: EM | Admit: 2012-06-04 | Discharge: 2012-06-04 | Disposition: A | Discharge status: Home / Self Care | Payer: Medicare Other | Attending: Emergency Medicine | Admitting: Emergency Medicine

## 2012-06-04 DIAGNOSIS — Z8669 Personal history of other diseases of the nervous system and sense organs: Secondary | ICD-10-CM | POA: Insufficient documentation

## 2012-06-04 DIAGNOSIS — D509 Iron deficiency anemia, unspecified: Secondary | ICD-10-CM | POA: Insufficient documentation

## 2012-06-04 DIAGNOSIS — I4891 Unspecified atrial fibrillation: Secondary | ICD-10-CM | POA: Insufficient documentation

## 2012-06-04 DIAGNOSIS — I2510 Atherosclerotic heart disease of native coronary artery without angina pectoris: Secondary | ICD-10-CM | POA: Insufficient documentation

## 2012-06-04 DIAGNOSIS — G43909 Migraine, unspecified, not intractable, without status migrainosus: Secondary | ICD-10-CM | POA: Insufficient documentation

## 2012-06-04 DIAGNOSIS — Z8719 Personal history of other diseases of the digestive system: Secondary | ICD-10-CM | POA: Insufficient documentation

## 2012-06-04 DIAGNOSIS — Z8659 Personal history of other mental and behavioral disorders: Secondary | ICD-10-CM | POA: Insufficient documentation

## 2012-06-04 DIAGNOSIS — Z79899 Other long term (current) drug therapy: Secondary | ICD-10-CM | POA: Insufficient documentation

## 2012-06-04 DIAGNOSIS — Z862 Personal history of diseases of the blood and blood-forming organs and certain disorders involving the immune mechanism: Secondary | ICD-10-CM | POA: Insufficient documentation

## 2012-06-04 DIAGNOSIS — Z8739 Personal history of other diseases of the musculoskeletal system and connective tissue: Secondary | ICD-10-CM | POA: Insufficient documentation

## 2012-06-04 DIAGNOSIS — R040 Epistaxis: Secondary | ICD-10-CM | POA: Diagnosis present

## 2012-06-04 DIAGNOSIS — I129 Hypertensive chronic kidney disease with stage 1 through stage 4 chronic kidney disease, or unspecified chronic kidney disease: Secondary | ICD-10-CM | POA: Insufficient documentation

## 2012-06-04 DIAGNOSIS — Z8639 Personal history of other endocrine, nutritional and metabolic disease: Secondary | ICD-10-CM | POA: Insufficient documentation

## 2012-06-04 DIAGNOSIS — R0982 Postnasal drip: Secondary | ICD-10-CM | POA: Insufficient documentation

## 2012-06-04 DIAGNOSIS — J3489 Other specified disorders of nose and nasal sinuses: Secondary | ICD-10-CM | POA: Insufficient documentation

## 2012-06-04 DIAGNOSIS — Z7901 Long term (current) use of anticoagulants: Secondary | ICD-10-CM | POA: Insufficient documentation

## 2012-06-04 DIAGNOSIS — E785 Hyperlipidemia, unspecified: Secondary | ICD-10-CM | POA: Insufficient documentation

## 2012-06-04 DIAGNOSIS — F411 Generalized anxiety disorder: Secondary | ICD-10-CM | POA: Insufficient documentation

## 2012-06-04 DIAGNOSIS — N184 Chronic kidney disease, stage 4 (severe): Secondary | ICD-10-CM | POA: Insufficient documentation

## 2012-06-04 DIAGNOSIS — K219 Gastro-esophageal reflux disease without esophagitis: Secondary | ICD-10-CM | POA: Insufficient documentation

## 2012-06-04 DIAGNOSIS — Z8679 Personal history of other diseases of the circulatory system: Secondary | ICD-10-CM | POA: Insufficient documentation

## 2012-06-04 DIAGNOSIS — H5440 Blindness, one eye, unspecified eye: Secondary | ICD-10-CM | POA: Insufficient documentation

## 2012-06-04 DIAGNOSIS — E0590 Thyrotoxicosis, unspecified without thyrotoxic crisis or storm: Secondary | ICD-10-CM | POA: Insufficient documentation

## 2012-06-04 DIAGNOSIS — M109 Gout, unspecified: Secondary | ICD-10-CM | POA: Insufficient documentation

## 2012-06-04 LAB — CBC
Hemoglobin: 11.8 g/dL — ABNORMAL LOW (ref 12.0–15.0)
MCH: 31.9 pg (ref 26.0–34.0)
MCHC: 33.2 g/dL (ref 30.0–36.0)
Platelets: 144 10*3/uL — ABNORMAL LOW (ref 150–400)
RDW: 15.1 % (ref 11.5–15.5)

## 2012-06-04 LAB — BASIC METABOLIC PANEL
BUN: 31 mg/dL — ABNORMAL HIGH (ref 6–23)
Calcium: 9.9 mg/dL (ref 8.4–10.5)
Chloride: 99 mEq/L (ref 96–112)
Creatinine, Ser: 2.1 mg/dL — ABNORMAL HIGH (ref 0.50–1.10)
GFR calc Af Amer: 23 mL/min — ABNORMAL LOW (ref >90)

## 2012-06-04 LAB — PROTIME-INR: Prothrombin Time: 24 seconds — ABNORMAL HIGH (ref 11.6–15.2)
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
## 2012-06-04 NOTE — ED Provider Notes (Signed)
I saw and evaluated the patient, reviewed the resident's note and I agree with the findings and plan.   Rolan Bucco, MD 06/04/12 [phone]

## 2012-06-04 NOTE — ED Notes (Signed)
Pt continues to not show signs of nose bleeding at this time. Pt alert and mentating appropriately. NAD noted at this time.
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
## 2012-06-04 NOTE — ED Notes (Signed)
MD at bedside. 

## 2012-06-04 NOTE — ED Notes (Signed)
Berline Chough, MD at bedside assessing pt and states does not see any blood coming from nose into back of throat

## 2012-06-04 NOTE — ED Notes (Signed)
Pt alert and mentating appropriately upon d/c. Pt given d/c teaching and follow up care instructions. Pt verbalizes understanding and has no further questions upon d/c. Pt husband taking her home. NAD noted upon d/c. Pt ambulatory upon d/c.

## 2012-06-05 ENCOUNTER — Ambulatory Visit (INDEPENDENT_AMBULATORY_CARE_PROVIDER_SITE_OTHER): Payer: Medicare Other | Admitting: Internal Medicine

## 2012-06-05 DIAGNOSIS — Z7901 Long term (current) use of anticoagulants: Secondary | ICD-10-CM

## 2012-06-05 DIAGNOSIS — I4891 Unspecified atrial fibrillation: Secondary | ICD-10-CM

## 2012-06-05 LAB — POCT INR: INR: 2.26

## 2012-06-19 ENCOUNTER — Ambulatory Visit (INDEPENDENT_AMBULATORY_CARE_PROVIDER_SITE_OTHER): Payer: Medicare Other | Admitting: *Deleted

## 2012-06-19 DIAGNOSIS — I4891 Unspecified atrial fibrillation: Secondary | ICD-10-CM

## 2012-06-19 DIAGNOSIS — Z7901 Long term (current) use of anticoagulants: Secondary | ICD-10-CM

## 2012-06-19 LAB — POCT INR: INR: 1.8

## 2012-06-20 ENCOUNTER — Emergency Department (HOSPITAL_COMMUNITY)
Admission: EM | Admit: 2012-06-20 | Discharge: 2012-06-20 | Disposition: A | Discharge status: Home / Self Care | Payer: Medicare Other | Attending: Emergency Medicine | Admitting: Emergency Medicine

## 2012-06-20 DIAGNOSIS — Z8639 Personal history of other endocrine, nutritional and metabolic disease: Secondary | ICD-10-CM | POA: Insufficient documentation

## 2012-06-20 DIAGNOSIS — Z8719 Personal history of other diseases of the digestive system: Secondary | ICD-10-CM | POA: Insufficient documentation

## 2012-06-20 DIAGNOSIS — I4891 Unspecified atrial fibrillation: Secondary | ICD-10-CM | POA: Insufficient documentation

## 2012-06-20 DIAGNOSIS — F411 Generalized anxiety disorder: Secondary | ICD-10-CM | POA: Insufficient documentation

## 2012-06-20 DIAGNOSIS — Z9861 Coronary angioplasty status: Secondary | ICD-10-CM | POA: Insufficient documentation

## 2012-06-20 DIAGNOSIS — H3530 Unspecified macular degeneration: Secondary | ICD-10-CM | POA: Insufficient documentation

## 2012-06-20 DIAGNOSIS — Z79899 Other long term (current) drug therapy: Secondary | ICD-10-CM | POA: Insufficient documentation

## 2012-06-20 DIAGNOSIS — Z7901 Long term (current) use of anticoagulants: Secondary | ICD-10-CM | POA: Insufficient documentation

## 2012-06-20 DIAGNOSIS — M129 Arthropathy, unspecified: Secondary | ICD-10-CM | POA: Insufficient documentation

## 2012-06-20 DIAGNOSIS — K219 Gastro-esophageal reflux disease without esophagitis: Secondary | ICD-10-CM | POA: Insufficient documentation

## 2012-06-20 DIAGNOSIS — Z8679 Personal history of other diseases of the circulatory system: Secondary | ICD-10-CM | POA: Insufficient documentation

## 2012-06-20 DIAGNOSIS — Z862 Personal history of diseases of the blood and blood-forming organs and certain disorders involving the immune mechanism: Secondary | ICD-10-CM | POA: Insufficient documentation

## 2012-06-20 DIAGNOSIS — I2510 Atherosclerotic heart disease of native coronary artery without angina pectoris: Secondary | ICD-10-CM | POA: Insufficient documentation

## 2012-06-20 DIAGNOSIS — I129 Hypertensive chronic kidney disease with stage 1 through stage 4 chronic kidney disease, or unspecified chronic kidney disease: Secondary | ICD-10-CM | POA: Insufficient documentation

## 2012-06-20 DIAGNOSIS — E785 Hyperlipidemia, unspecified: Secondary | ICD-10-CM | POA: Insufficient documentation

## 2012-06-20 DIAGNOSIS — N189 Chronic kidney disease, unspecified: Secondary | ICD-10-CM | POA: Insufficient documentation

## 2012-06-20 DIAGNOSIS — G43909 Migraine, unspecified, not intractable, without status migrainosus: Secondary | ICD-10-CM | POA: Insufficient documentation

## 2012-06-20 DIAGNOSIS — D509 Iron deficiency anemia, unspecified: Secondary | ICD-10-CM | POA: Insufficient documentation

## 2012-06-20 DIAGNOSIS — Z8659 Personal history of other mental and behavioral disorders: Secondary | ICD-10-CM | POA: Insufficient documentation

## 2012-06-20 DIAGNOSIS — R040 Epistaxis: Secondary | ICD-10-CM | POA: Insufficient documentation

## 2012-06-20 LAB — CBC
HCT: 34.5 % — ABNORMAL LOW (ref 36.0–46.0)
MCH: 32.6 pg (ref 26.0–34.0)
MCV: 97.7 fL (ref 78.0–100.0)
Platelets: 144 10*3/uL — ABNORMAL LOW (ref 150–400)
RBC: 3.53 MIL/uL — ABNORMAL LOW (ref 3.87–5.11)
WBC: 7.2 10*3/uL (ref 4.0–10.5)

## 2012-06-20 LAB — BASIC METABOLIC PANEL
BUN: 34 mg/dL — ABNORMAL HIGH (ref 6–23)
CO2: 26 mEq/L (ref 19–32)
Calcium: 9.7 mg/dL (ref 8.4–10.5)
Chloride: 96 mEq/L (ref 96–112)
Creatinine, Ser: 2.11 mg/dL — ABNORMAL HIGH (ref 0.50–1.10)

## 2012-06-20 MED ORDER — OXYMETAZOLINE HCL 0.05 % NA SOLN
1.0000 | Freq: Once | NASAL | Status: AC
  Administered 2012-06-20: 1 via NASAL
  Filled 2012-06-20: qty 15

## 2012-06-20 MED ORDER — ACETAMINOPHEN 325 MG PO TABS
650.0000 mg | ORAL_TABLET | Freq: Once | ORAL | Status: AC
  Administered 2012-06-20: 650 mg via ORAL
  Filled 2012-06-20: qty 2

## 2012-06-20 NOTE — ED Notes (Addendum)
Patient arrived via [facility] with epistaxis that started apprx at 0930 at [facility]. Patient is on coumadin for the past 10 yrs. Patient has been having on and off epistaxis for the past 5-6 weeks. The last visit silver nitrate was used to stop the bleeding. EMS administered Afrin x1 with minimal results.
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
## 2012-06-24 NOTE — ED Provider Notes (Signed)
Medical screening examination/treatment/procedure(s) were conducted as a shared visit with non-physician practitioner(s) and myself.  I personally evaluated the patient during the encounter   Loren Racer, MD 06/24/12 [phone]

## 2012-07-11 ENCOUNTER — Other Ambulatory Visit: Payer: Self-pay | Admitting: Cardiology

## 2012-07-16 ENCOUNTER — Telehealth: Payer: Self-pay | Admitting: Pharmacist

## 2012-07-17 ENCOUNTER — Ambulatory Visit (INDEPENDENT_AMBULATORY_CARE_PROVIDER_SITE_OTHER): Payer: Medicare Other | Admitting: *Deleted

## 2012-07-17 DIAGNOSIS — Z7901 Long term (current) use of anticoagulants: Secondary | ICD-10-CM

## 2012-07-17 DIAGNOSIS — I4891 Unspecified atrial fibrillation: Secondary | ICD-10-CM

## 2012-07-17 LAB — POCT INR: INR: 1.8

## 2012-08-05 ENCOUNTER — Encounter: Payer: Self-pay | Admitting: *Deleted

## 2012-08-05 ENCOUNTER — Encounter: Payer: Self-pay | Admitting: Internal Medicine

## 2012-08-05 ENCOUNTER — Ambulatory Visit (INDEPENDENT_AMBULATORY_CARE_PROVIDER_SITE_OTHER): Payer: Medicare Other | Admitting: *Deleted

## 2012-08-05 ENCOUNTER — Ambulatory Visit (INDEPENDENT_AMBULATORY_CARE_PROVIDER_SITE_OTHER): Payer: Medicare Other | Admitting: Internal Medicine

## 2012-08-05 VITALS — BP 141/79 | HR 70 | Ht 63.0 in | Wt 147.1 lb

## 2012-08-05 DIAGNOSIS — I4891 Unspecified atrial fibrillation: Secondary | ICD-10-CM

## 2012-08-05 DIAGNOSIS — Z7901 Long term (current) use of anticoagulants: Secondary | ICD-10-CM

## 2012-08-05 DIAGNOSIS — Z950 Presence of cardiac pacemaker: Secondary | ICD-10-CM

## 2012-08-05 DIAGNOSIS — I495 Sick sinus syndrome: Secondary | ICD-10-CM

## 2012-08-05 LAB — PACEMAKER DEVICE OBSERVATION
BAMS-0001: 170 {beats}/min
BRDY-0002RV: 60 {beats}/min
RV LEAD AMPLITUDE: 15.68 mv
RV LEAD IMPEDENCE PM: 368 Ohm
VENTRICULAR PACING PM: 70

## 2012-08-05 NOTE — Patient Instructions (Addendum)
PACER GENERATOR CHANGE
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
## 2012-08-08 ENCOUNTER — Encounter (HOSPITAL_COMMUNITY): Payer: Self-pay

## 2012-08-08 ENCOUNTER — Other Ambulatory Visit: Payer: Self-pay | Admitting: *Deleted

## 2012-08-08 DIAGNOSIS — I4891 Unspecified atrial fibrillation: Secondary | ICD-10-CM

## 2012-08-08 DIAGNOSIS — Z45018 Encounter for adjustment and management of other part of cardiac pacemaker: Secondary | ICD-10-CM

## 2012-08-08 DIAGNOSIS — I495 Sick sinus syndrome: Secondary | ICD-10-CM

## 2012-08-19 ENCOUNTER — Ambulatory Visit (INDEPENDENT_AMBULATORY_CARE_PROVIDER_SITE_OTHER): Payer: Medicare Other | Admitting: *Deleted

## 2012-08-19 DIAGNOSIS — I252 Old myocardial infarction: Secondary | ICD-10-CM

## 2012-08-19 DIAGNOSIS — I2510 Atherosclerotic heart disease of native coronary artery without angina pectoris: Secondary | ICD-10-CM

## 2012-08-19 DIAGNOSIS — I4891 Unspecified atrial fibrillation: Secondary | ICD-10-CM

## 2012-08-19 DIAGNOSIS — I671 Cerebral aneurysm, nonruptured: Secondary | ICD-10-CM

## 2012-08-19 LAB — CBC WITH DIFFERENTIAL/PLATELET
Eosinophils Absolute: 0.1 10*3/uL (ref 0.0–0.7)
Lymphocytes Relative: 17.9 % (ref 12.0–46.0)
MCHC: 33.1 g/dL (ref 30.0–36.0)
MCV: 99.5 fl (ref 78.0–100.0)
Monocytes Absolute: 0.6 10*3/uL (ref 0.1–1.0)
Neutrophils Relative %: 70.7 % (ref 43.0–77.0)
Platelets: 139 10*3/uL — ABNORMAL LOW (ref 150.0–400.0)
WBC: 6.1 10*3/uL (ref 4.5–10.5)

## 2012-08-19 LAB — PROTIME-INR
INR: 3.1 ratio — ABNORMAL HIGH (ref 0.8–1.0)
Prothrombin Time: 31.9 s — ABNORMAL HIGH (ref 10.2–12.4)

## 2012-08-19 LAB — BASIC METABOLIC PANEL
BUN: 30 mg/dL — ABNORMAL HIGH (ref 6–23)
CO2: 28 mEq/L (ref 19–32)
Calcium: 9.3 mg/dL (ref 8.4–10.5)
Chloride: 100 mEq/L (ref 96–112)
Creatinine, Ser: 2 mg/dL — ABNORMAL HIGH (ref 0.4–1.2)

## 2012-08-20 ENCOUNTER — Telehealth: Payer: Self-pay | Admitting: Internal Medicine

## 2012-08-20 MED ORDER — CEFAZOLIN SODIUM-DEXTROSE 2-3 GM-% IV SOLR
2.0000 g | INTRAVENOUS | Status: AC
  Filled 2012-08-20 (×2): qty 50

## 2012-08-20 MED ORDER — SODIUM CHLORIDE 0.9 % IR SOLN
80.0000 mg | Status: AC
  Filled 2012-08-20: qty 2

## 2012-08-20 NOTE — Telephone Encounter (Signed)
lmom for patient to call me back. 

## 2012-08-20 NOTE — Telephone Encounter (Signed)
Her INR on 7/28 3.1  She is going to hold tonight and we will redraw tomorrow at hospital

## 2012-08-20 NOTE — Telephone Encounter (Signed)
New problem    Questions regarding upcoming procedure on tomorrow.

## 2012-08-21 ENCOUNTER — Encounter (HOSPITAL_COMMUNITY)
Admission: RE | Disposition: A | Discharge status: Home / Self Care | Payer: Self-pay | Source: Ambulatory Visit | Attending: Internal Medicine

## 2012-08-21 ENCOUNTER — Ambulatory Visit (HOSPITAL_COMMUNITY)
Admission: RE | Admit: 2012-08-21 | Discharge: 2012-08-21 | Disposition: A | Discharge status: Home / Self Care | Payer: Medicare Other | Source: Ambulatory Visit | Attending: Internal Medicine | Admitting: Internal Medicine

## 2012-08-21 DIAGNOSIS — I359 Nonrheumatic aortic valve disorder, unspecified: Secondary | ICD-10-CM | POA: Insufficient documentation

## 2012-08-21 DIAGNOSIS — D509 Iron deficiency anemia, unspecified: Secondary | ICD-10-CM | POA: Insufficient documentation

## 2012-08-21 DIAGNOSIS — E785 Hyperlipidemia, unspecified: Secondary | ICD-10-CM | POA: Insufficient documentation

## 2012-08-21 DIAGNOSIS — Z7901 Long term (current) use of anticoagulants: Secondary | ICD-10-CM | POA: Insufficient documentation

## 2012-08-21 DIAGNOSIS — I2510 Atherosclerotic heart disease of native coronary artery without angina pectoris: Secondary | ICD-10-CM | POA: Insufficient documentation

## 2012-08-21 DIAGNOSIS — Z45018 Encounter for adjustment and management of other part of cardiac pacemaker: Secondary | ICD-10-CM

## 2012-08-21 DIAGNOSIS — K219 Gastro-esophageal reflux disease without esophagitis: Secondary | ICD-10-CM | POA: Insufficient documentation

## 2012-08-21 DIAGNOSIS — I4891 Unspecified atrial fibrillation: Secondary | ICD-10-CM

## 2012-08-21 DIAGNOSIS — I671 Cerebral aneurysm, nonruptured: Secondary | ICD-10-CM | POA: Insufficient documentation

## 2012-08-21 DIAGNOSIS — I495 Sick sinus syndrome: Secondary | ICD-10-CM

## 2012-08-21 DIAGNOSIS — Z79899 Other long term (current) drug therapy: Secondary | ICD-10-CM | POA: Insufficient documentation

## 2012-08-21 DIAGNOSIS — N184 Chronic kidney disease, stage 4 (severe): Secondary | ICD-10-CM | POA: Insufficient documentation

## 2012-08-21 DIAGNOSIS — I129 Hypertensive chronic kidney disease with stage 1 through stage 4 chronic kidney disease, or unspecified chronic kidney disease: Secondary | ICD-10-CM | POA: Insufficient documentation

## 2012-08-21 DIAGNOSIS — E0500 Thyrotoxicosis with diffuse goiter without thyrotoxic crisis or storm: Secondary | ICD-10-CM | POA: Insufficient documentation

## 2012-08-21 DIAGNOSIS — G2581 Restless legs syndrome: Secondary | ICD-10-CM | POA: Insufficient documentation

## 2012-08-21 HISTORY — PX: PACEMAKER GENERATOR CHANGE: SHX5998

## 2012-08-21 HISTORY — PX: PERMANENT PACEMAKER GENERATOR CHANGE: SHX6022

## 2012-08-21 SURGERY — PERMANENT PACEMAKER GENERATOR CHANGE
Anesthesia: LOCAL

## 2012-08-21 MED ORDER — MIDAZOLAM HCL 5 MG/5ML IJ SOLN
INTRAMUSCULAR | Status: AC
  Filled 2012-08-21: qty 5

## 2012-08-21 MED ORDER — CHLORHEXIDINE GLUCONATE 4 % EX LIQD
60.0000 mL | Freq: Once | CUTANEOUS | Status: DC
  Filled 2012-08-21: qty 60

## 2012-08-21 MED ORDER — SODIUM CHLORIDE 0.9 % IJ SOLN: 3.0000 mL | Freq: Two times a day (BID) | INTRAMUSCULAR | Status: DC

## 2012-08-21 MED ORDER — ONDANSETRON HCL 4 MG/2ML IJ SOLN: 4.0000 mg | Freq: Four times a day (QID) | INTRAMUSCULAR | Status: DC | PRN

## 2012-08-21 MED ORDER — SODIUM CHLORIDE 0.9 % IV SOLN: 250.0000 mL | INTRAVENOUS | Status: DC | PRN

## 2012-08-21 MED ORDER — SODIUM CHLORIDE 0.9 % IJ SOLN: 3.0000 mL | INTRAMUSCULAR | Status: DC | PRN

## 2012-08-21 MED ORDER — MUPIROCIN 2 % EX OINT
TOPICAL_OINTMENT | CUTANEOUS | Status: AC
  Filled 2012-08-21: qty 22

## 2012-08-21 MED ORDER — LIDOCAINE HCL (PF) 1 % IJ SOLN
INTRAMUSCULAR | Status: AC
  Filled 2012-08-21: qty 60

## 2012-08-21 MED ORDER — SODIUM CHLORIDE 0.9 % IV SOLN
INTRAVENOUS | Status: DC
  Administered 2012-08-21: 10:00:00 via INTRAVENOUS

## 2012-08-21 MED ORDER — ACETAMINOPHEN 325 MG PO TABS
325.0000 mg | ORAL_TABLET | ORAL | Status: DC | PRN
  Administered 2012-08-21: 650 mg via ORAL
  Filled 2012-08-21: qty 2

## 2012-08-21 MED ORDER — MUPIROCIN 2 % EX OINT
TOPICAL_OINTMENT | Freq: Two times a day (BID) | CUTANEOUS | Status: DC
Start: 2012-08-21 — End: 2012-08-21
  Administered 2012-08-21: 1 via NASAL
  Filled 2012-08-21: qty 22

## 2012-08-21 NOTE — Interval H&P Note (Signed)
History and Physical Interval Note:  08/21/2012 1:46 PM  Tracey [name]  has presented today for surgery, with the diagnosis of End of life  The various methods of treatment have been discussed with the patient and family. After consideration of risks, benefits and other options for treatment, the patient has consented to  Procedure(s): PERMANENT PACEMAKER GENERATOR CHANGE (N/A) as a surgical intervention .  The patient's history has been reviewed, patient examined, no change in status, stable for surgery.  I have reviewed the patient's chart and labs.  Questions were answered to the patient's satisfaction.     Hillis Range
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
## 2012-08-21 NOTE — Op Note (Signed)
SURGEON:  Hillis Range, MD     PREPROCEDURE DIAGNOSES:   1. Tachycardia/ bradycardia syndrome  2. Permanent Atrial fibrillation.     POSTPROCEDURE DIAGNOSES:   1. Tachycardia/ bradycardia syndrome  2. Permanent Atrial fibrillation.     PROCEDURES:   1. Pacemaker pulse generator replacement.   2. Skin pocket revision.     INTRODUCTION:  Tracey [name] is a 80 y.o. female with a history of tachycardia bradycardia syndrome and permanent atrial fibrillation. She has done well since her pacemaker was implanted.  She has recently reached ERI battery status.  She presents today for pacemaker pulse generator replacement.       DESCRIPTION OF THE PROCEDURE:  Informed written consent was obtained, and the patient was brought to the electrophysiology lab in the fasting state.  The patient's pacemaker was interrogated today and found to be at elective replacement indicator battery status.  The patient was adequately sedated with intravenous Versed as outlined in the nursing report.  The patient's left chest was prepped and draped in the usual sterile fashion by the EP lab staff.  The skin overlying the existing pacemaker was infiltrated with lidocaine for local analgesia.  A 4-cm incision was made over the pacemaker pocket.  Using a combination of sharp and blunt dissection, the pacemaker was exposed and removed from the body.  The device was disconnected from the leads. A single silk suture was identified and removed which had secured the device to the pectoralis fascia.  There was no foreign matter or debris within the pocket.  The atrial lead was confirmed to be a Medtronic model [id number] (serial number [id number]) lead implanted on 01/30/2007.  This lead was capped and returned to the pocket because of permanent afib.  The right ventricular lead was confirmed to be a Medtronic model [id number] (serial number [id number]) lead implanted on the same date as the atrial lead (above).  Both leads were examined and  their integrity was confirmed to be intact.   Right ventricular lead R-waves measured 15 mV with impedance of 433 ohms and a threshold of 0.9 V at 0.5 msec.  The RV lead was connected to a Medtronic [place] model [id number] (serial number [id number]) pacemaker.  The pocket was revised to accommodate this new device.  Electrocautery was required to assure hemostasis.  The pocket was irrigated with copious gentamicin solution. The pacemaker was then placed into the pocket.  The pocket was then closed in 2 layers with 2-0 Vicryl suture over the subcutaneous and subcuticular layers.  Steri-Strips and a sterile dressing were then applied.  There were no early apparent complications.     CONCLUSIONS:   1. Successful pacemaker pulse generator replacement for elective replacement indicator battery status   2. No early apparent complications.     Hillis Range, MD 08/21/2012 3:26 PM

## 2012-08-22 ENCOUNTER — Telehealth: Payer: Self-pay | Admitting: Internal Medicine

## 2012-08-22 NOTE — Telephone Encounter (Signed)
New Prob  Pt states she had a pacemaker placed and was discharged from hospital. She said that she was told she was to take an antibiotic but was not given any prescription for one.

## 2012-08-22 NOTE — Telephone Encounter (Signed)
lmom for patient to return my call.  Talked to [facility] and she does not need antibiotic  Left another message for patient no need for antibiotic

## 2012-08-22 NOTE — Telephone Encounter (Signed)
Follow up  Pt is calling regarding the antibiotic

## 2012-08-26 ENCOUNTER — Telehealth: Payer: Self-pay | Admitting: Internal Medicine

## 2012-08-26 NOTE — Telephone Encounter (Signed)
Spoke with patient and discussed her pain issue with Dr Johney Frame.  She will have her site looked at tomorrow while here at the office

## 2012-08-26 NOTE — Telephone Encounter (Signed)
New Prob     Pt states she is still hurting from her pacemaker placement and she wants to make sure the discomfort is normal. Please call.

## 2012-08-27 ENCOUNTER — Telehealth: Payer: Self-pay | Admitting: Internal Medicine

## 2012-08-27 ENCOUNTER — Ambulatory Visit (INDEPENDENT_AMBULATORY_CARE_PROVIDER_SITE_OTHER): Payer: Medicare Other | Admitting: *Deleted

## 2012-08-27 ENCOUNTER — Encounter: Payer: Self-pay | Admitting: Cardiology

## 2012-08-27 ENCOUNTER — Ambulatory Visit (INDEPENDENT_AMBULATORY_CARE_PROVIDER_SITE_OTHER): Payer: Medicare Other | Admitting: Cardiology

## 2012-08-27 VITALS — BP 128/80 | HR 70 | Wt 144.0 lb

## 2012-08-27 DIAGNOSIS — Z7901 Long term (current) use of anticoagulants: Secondary | ICD-10-CM

## 2012-08-27 DIAGNOSIS — Z950 Presence of cardiac pacemaker: Secondary | ICD-10-CM

## 2012-08-27 DIAGNOSIS — I4891 Unspecified atrial fibrillation: Secondary | ICD-10-CM

## 2012-08-27 DIAGNOSIS — N184 Chronic kidney disease, stage 4 (severe): Secondary | ICD-10-CM

## 2012-08-27 DIAGNOSIS — I2510 Atherosclerotic heart disease of native coronary artery without angina pectoris: Secondary | ICD-10-CM

## 2012-08-27 DIAGNOSIS — E785 Hyperlipidemia, unspecified: Secondary | ICD-10-CM

## 2012-08-27 DIAGNOSIS — I10 Essential (primary) hypertension: Secondary | ICD-10-CM

## 2012-08-27 LAB — PACEMAKER DEVICE OBSERVATION
BATTERY VOLTAGE: 2.8 V
BMOD-0005RV: 95 {beats}/min
RV LEAD AMPLITUDE: 15.67 mv
RV LEAD IMPEDENCE PM: 474 Ohm

## 2012-08-27 NOTE — Assessment & Plan Note (Signed)
Patient is status post pacemaker. She will have a wound check today. She does have erythema surrounding her pocket but feels this is most likely related to an allergy to tape. She has not had fever. Her incision does not appear to be infected.

## 2012-08-27 NOTE — Progress Notes (Signed)
Pt seen in device clinic for follow up of recently replaced pacemaker.  Large hematoma with tightness at pocket. Steri-strips not removed due to coumadin clinic plan to inc dosage. Dr. Ladona Ridgel instructed pt not to increase dosage until next week to facilitate pocket healing.  Device interrogated and found to be functioning normally.  Changed V output from 2.0V to 2.5V, no other changes made. See PaceArt for full details.  Pt to follow up with device clinic for wound re-check only on 09/04/12 @ 3:30pm---Dr. Allred needed.   Ladonya Jerkins 08/27/2012 1:05 PM

## 2012-08-27 NOTE — Assessment & Plan Note (Signed)
Continue statin. Not on aspirin given need for Coumadin. 
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
## 2012-08-27 NOTE — Telephone Encounter (Signed)
New Prob     Pt would like to speak to someone regarding the itching around her wound. Please call.

## 2012-08-27 NOTE — Assessment & Plan Note (Signed)
Blood pressure controlled. Continue present medications. 

## 2012-08-27 NOTE — Assessment & Plan Note (Signed)
Followed by nephrology. 

## 2012-08-27 NOTE — Assessment & Plan Note (Signed)
Continue beta blocker and Coumadin. 

## 2012-08-27 NOTE — Patient Instructions (Addendum)
Your physician wants you to follow-up in:  6 months. You will receive a reminder letter in the mail two months in advance. If you don't receive a letter, please call our office to schedule the follow-up appointment.   

## 2012-08-27 NOTE — Assessment & Plan Note (Signed)
Continue statin. 

## 2012-08-28 NOTE — Telephone Encounter (Signed)
Spoke with patient in regards to itching. Per pt feels much better today. Pt was seen in clinic on 08-27-12 and will be reevaluated 09-04-12 @ 1530.

## 2012-09-02 ENCOUNTER — Ambulatory Visit (INDEPENDENT_AMBULATORY_CARE_PROVIDER_SITE_OTHER): Payer: Medicare Other | Admitting: Pharmacist

## 2012-09-02 ENCOUNTER — Ambulatory Visit (INDEPENDENT_AMBULATORY_CARE_PROVIDER_SITE_OTHER): Payer: Medicare Other | Admitting: *Deleted

## 2012-09-02 DIAGNOSIS — Z7901 Long term (current) use of anticoagulants: Secondary | ICD-10-CM

## 2012-09-02 DIAGNOSIS — I4891 Unspecified atrial fibrillation: Secondary | ICD-10-CM

## 2012-09-02 DIAGNOSIS — Z950 Presence of cardiac pacemaker: Secondary | ICD-10-CM

## 2012-09-02 LAB — POCT INR: INR: 1.6

## 2012-09-02 NOTE — Progress Notes (Signed)
Partial wound check to re-view bruising & reaction to tape adhesive.  Pt doing better per Dr. Tomi Bamberger be seen 09/04/12 for full wound check.   Pt also sent today for coumadin clinic.

## 2012-09-04 ENCOUNTER — Ambulatory Visit (INDEPENDENT_AMBULATORY_CARE_PROVIDER_SITE_OTHER): Payer: Medicare Other | Admitting: *Deleted

## 2012-09-04 DIAGNOSIS — I495 Sick sinus syndrome: Secondary | ICD-10-CM

## 2012-09-04 NOTE — Progress Notes (Signed)
Steri-strips removed.  Hematoma resolving.   Follow up in November with Dr. Johney Frame.

## 2012-09-05 ENCOUNTER — Ambulatory Visit (INDEPENDENT_AMBULATORY_CARE_PROVIDER_SITE_OTHER): Payer: Medicare Other | Admitting: *Deleted

## 2012-09-05 ENCOUNTER — Telehealth: Payer: Self-pay | Admitting: Internal Medicine

## 2012-09-05 DIAGNOSIS — R42 Dizziness and giddiness: Secondary | ICD-10-CM

## 2012-09-05 DIAGNOSIS — I4891 Unspecified atrial fibrillation: Secondary | ICD-10-CM

## 2012-09-05 DIAGNOSIS — Z7901 Long term (current) use of anticoagulants: Secondary | ICD-10-CM

## 2012-09-05 LAB — CBC WITH DIFFERENTIAL/PLATELET
Basophils Absolute: 0 10*3/uL (ref 0.0–0.1)
Eosinophils Absolute: 0.6 10*3/uL (ref 0.0–0.7)
HCT: 38.5 % (ref 36.0–46.0)
Lymphs Abs: 1.2 10*3/uL (ref 0.7–4.0)
MCHC: 33.6 g/dL (ref 30.0–36.0)
MCV: 98.3 fl (ref 78.0–100.0)
Monocytes Absolute: 0.6 10*3/uL (ref 0.1–1.0)
Neutrophils Relative %: 64 % (ref 43.0–77.0)
Platelets: 153 10*3/uL (ref 150.0–400.0)
RDW: 16.7 % — ABNORMAL HIGH (ref 11.5–14.6)

## 2012-09-05 LAB — BASIC METABOLIC PANEL
BUN: 38 mg/dL — ABNORMAL HIGH (ref 6–23)
Chloride: 96 mEq/L (ref 96–112)
Creatinine, Ser: 2.5 mg/dL — ABNORMAL HIGH (ref 0.4–1.2)
Glucose, Bld: 103 mg/dL — ABNORMAL HIGH (ref 70–99)
Potassium: 4 mEq/L (ref 3.5–5.1)

## 2012-09-05 NOTE — Telephone Encounter (Signed)
Appointment moved to 8/21 2:15pm, has wound check here same day.

## 2012-09-05 NOTE — Telephone Encounter (Signed)
New Prob  Pt wants to know if her coumadin appt should stay on Aug 27 since her device check appt has been moved to Aug 21.

## 2012-09-05 NOTE — Progress Notes (Signed)
The patient was seen today in the device clinic for oozing @ her PPM site and dizziness.   Per Dr. Johney Frame, [facility] today.  B/P 134/76.  Steri strips allied.  ROV 09/18/12 @2 :30pm.

## 2012-09-06 ENCOUNTER — Telehealth: Payer: Self-pay | Admitting: *Deleted

## 2012-09-06 NOTE — Telephone Encounter (Signed)
Call pt and let her know about labs results and Dr. Johney Frame recommendations to make sure she is well hydrated and F/U with her PCP if dizziness persist. Pt verbalized understanding

## 2012-09-09 ENCOUNTER — Telehealth: Payer: Self-pay | Admitting: *Deleted

## 2012-09-09 NOTE — Telephone Encounter (Signed)
Patient called to ask for her lab work results again. Tracey [name] rec'd call yesterday but states Tracey [name] did not understand the conversation or instructions.  Reviewed labs with patient again and discussed instruction from Dr. Johney Frame regarding staying hydrated and notifying PCP for any further or continued or worsening dizziness. Patient verbalized appreciation, understanding and agreement.

## 2012-09-12 ENCOUNTER — Ambulatory Visit (INDEPENDENT_AMBULATORY_CARE_PROVIDER_SITE_OTHER): Payer: Medicare Other | Admitting: *Deleted

## 2012-09-12 ENCOUNTER — Ambulatory Visit: Payer: Medicare Other | Admitting: *Deleted

## 2012-09-12 DIAGNOSIS — Z7901 Long term (current) use of anticoagulants: Secondary | ICD-10-CM

## 2012-09-12 DIAGNOSIS — I4891 Unspecified atrial fibrillation: Secondary | ICD-10-CM

## 2012-09-12 DIAGNOSIS — I495 Sick sinus syndrome: Secondary | ICD-10-CM

## 2012-09-12 LAB — POCT INR: INR: 1.9

## 2012-09-12 NOTE — Progress Notes (Signed)
Wound recheck in clinic. Steri strips removed. Tracey [name] evaluated site as well due to last time of being seen. Hematoma has less swelling. Pt advised could take shower at this time and follow up as planned.

## 2012-09-18 ENCOUNTER — Ambulatory Visit: Payer: Medicare Other

## 2012-09-24 ENCOUNTER — Ambulatory Visit (INDEPENDENT_AMBULATORY_CARE_PROVIDER_SITE_OTHER): Payer: Medicare Other

## 2012-09-24 DIAGNOSIS — Z7901 Long term (current) use of anticoagulants: Secondary | ICD-10-CM

## 2012-09-24 DIAGNOSIS — I4891 Unspecified atrial fibrillation: Secondary | ICD-10-CM

## 2012-09-30 ENCOUNTER — Encounter: Payer: Self-pay | Admitting: Internal Medicine

## 2012-10-15 ENCOUNTER — Ambulatory Visit (INDEPENDENT_AMBULATORY_CARE_PROVIDER_SITE_OTHER): Payer: Medicare Other

## 2012-10-15 ENCOUNTER — Telehealth: Payer: Self-pay | Admitting: Cardiovascular Disease

## 2012-10-15 DIAGNOSIS — I4891 Unspecified atrial fibrillation: Secondary | ICD-10-CM

## 2012-10-15 DIAGNOSIS — Z7901 Long term (current) use of anticoagulants: Secondary | ICD-10-CM

## 2012-10-15 NOTE — Telephone Encounter (Signed)
Walk In pt Form " Dental Clearance" gave to [name]/Mc[place] 10/15/12/KM

## 2012-10-16 ENCOUNTER — Telehealth: Payer: Self-pay | Admitting: Cardiology

## 2012-10-16 NOTE — Telephone Encounter (Signed)
Walk In pt Form " Dental Clearance" gave to [facility] yesterday she gave back to Me and said it Needs  To to To [place], gave to Victorio Palm  10/16/12/KM

## 2012-11-12 ENCOUNTER — Ambulatory Visit (INDEPENDENT_AMBULATORY_CARE_PROVIDER_SITE_OTHER): Payer: Medicare Other | Admitting: *Deleted

## 2012-11-12 DIAGNOSIS — I4891 Unspecified atrial fibrillation: Secondary | ICD-10-CM

## 2012-11-12 DIAGNOSIS — Z7901 Long term (current) use of anticoagulants: Secondary | ICD-10-CM

## 2012-11-27 ENCOUNTER — Ambulatory Visit (INDEPENDENT_AMBULATORY_CARE_PROVIDER_SITE_OTHER): Payer: Medicare Other | Admitting: Internal Medicine

## 2012-11-27 ENCOUNTER — Encounter: Payer: Self-pay | Admitting: Internal Medicine

## 2012-11-27 VITALS — BP 124/78 | HR 78 | Ht 63.0 in | Wt 147.0 lb

## 2012-11-27 DIAGNOSIS — Z950 Presence of cardiac pacemaker: Secondary | ICD-10-CM

## 2012-11-27 DIAGNOSIS — I4891 Unspecified atrial fibrillation: Secondary | ICD-10-CM

## 2012-11-27 DIAGNOSIS — Z23 Encounter for immunization: Secondary | ICD-10-CM

## 2012-11-27 DIAGNOSIS — I495 Sick sinus syndrome: Secondary | ICD-10-CM

## 2012-11-27 LAB — PACEMAKER DEVICE OBSERVATION
BMOD-0005RV: 95 {beats}/min
RV LEAD AMPLITUDE: 15.68 mv
RV LEAD IMPEDENCE PM: 509 Ohm
VENTRICULAR PACING PM: 70.9

## 2012-11-27 NOTE — Patient Instructions (Signed)
Your physician recommends that you schedule a follow-up appointment in: 6 weeks with Dr Allred  

## 2012-12-01 ENCOUNTER — Encounter: Payer: Self-pay | Admitting: Internal Medicine
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
## 2012-12-06 ENCOUNTER — Encounter: Payer: Self-pay | Admitting: Internal Medicine

## 2012-12-10 ENCOUNTER — Ambulatory Visit (INDEPENDENT_AMBULATORY_CARE_PROVIDER_SITE_OTHER): Payer: Medicare Other | Admitting: *Deleted

## 2012-12-10 ENCOUNTER — Encounter: Payer: Self-pay | Admitting: Internal Medicine

## 2012-12-10 DIAGNOSIS — I4891 Unspecified atrial fibrillation: Secondary | ICD-10-CM

## 2012-12-10 DIAGNOSIS — Z7901 Long term (current) use of anticoagulants: Secondary | ICD-10-CM

## 2012-12-10 LAB — POCT INR: INR: 3

## 2012-12-12 ENCOUNTER — Telehealth: Payer: Self-pay | Admitting: Cardiology

## 2013-01-08 ENCOUNTER — Encounter: Payer: Self-pay | Admitting: Internal Medicine

## 2013-01-08 ENCOUNTER — Ambulatory Visit (INDEPENDENT_AMBULATORY_CARE_PROVIDER_SITE_OTHER): Payer: Medicare Other | Admitting: Internal Medicine

## 2013-01-08 ENCOUNTER — Ambulatory Visit (INDEPENDENT_AMBULATORY_CARE_PROVIDER_SITE_OTHER): Payer: Medicare Other | Admitting: *Deleted

## 2013-01-08 VITALS — BP 128/78 | HR 89 | Ht 63.0 in | Wt 146.8 lb

## 2013-01-08 DIAGNOSIS — Z7901 Long term (current) use of anticoagulants: Secondary | ICD-10-CM

## 2013-01-08 DIAGNOSIS — I495 Sick sinus syndrome: Secondary | ICD-10-CM

## 2013-01-08 DIAGNOSIS — I4891 Unspecified atrial fibrillation: Secondary | ICD-10-CM
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
## 2013-01-08 NOTE — Patient Instructions (Signed)
Your physician recommends that you schedule a follow-up appointment in 3 months with Dr Allred    

## 2013-02-12 ENCOUNTER — Ambulatory Visit (INDEPENDENT_AMBULATORY_CARE_PROVIDER_SITE_OTHER): Payer: Medicare Other | Admitting: Pharmacist

## 2013-02-12 DIAGNOSIS — I4891 Unspecified atrial fibrillation: Secondary | ICD-10-CM

## 2013-02-12 DIAGNOSIS — Z7901 Long term (current) use of anticoagulants: Secondary | ICD-10-CM

## 2013-02-12 LAB — POCT INR: INR: 1.7

## 2013-02-27 ENCOUNTER — Encounter: Payer: Self-pay | Admitting: Cardiology

## 2013-02-27 ENCOUNTER — Ambulatory Visit (INDEPENDENT_AMBULATORY_CARE_PROVIDER_SITE_OTHER): Payer: Medicare Other | Admitting: *Deleted

## 2013-02-27 ENCOUNTER — Ambulatory Visit (INDEPENDENT_AMBULATORY_CARE_PROVIDER_SITE_OTHER): Payer: Medicare Other | Admitting: Cardiology

## 2013-02-27 VITALS — BP 140/86 | HR 80 | Ht 63.0 in | Wt 143.8 lb

## 2013-02-27 DIAGNOSIS — I2510 Atherosclerotic heart disease of native coronary artery without angina pectoris: Secondary | ICD-10-CM

## 2013-02-27 DIAGNOSIS — I4891 Unspecified atrial fibrillation: Secondary | ICD-10-CM

## 2013-02-27 DIAGNOSIS — I10 Essential (primary) hypertension: Secondary | ICD-10-CM

## 2013-02-27 DIAGNOSIS — Z950 Presence of cardiac pacemaker: Secondary | ICD-10-CM

## 2013-02-27 DIAGNOSIS — E785 Hyperlipidemia, unspecified: Secondary | ICD-10-CM

## 2013-02-27 DIAGNOSIS — Z7901 Long term (current) use of anticoagulants: Secondary | ICD-10-CM

## 2013-02-27 DIAGNOSIS — N184 Chronic kidney disease, stage 4 (severe): Secondary | ICD-10-CM

## 2013-02-27 LAB — CBC WITH DIFFERENTIAL/PLATELET
Basophils Absolute: 0 10*3/uL (ref 0.0–0.1)
Basophils Relative: 0.5 % (ref 0.0–3.0)
Eosinophils Absolute: 0.1 10*3/uL (ref 0.0–0.7)
Eosinophils Relative: 1.8 % (ref 0.0–5.0)
HCT: 41.3 % (ref 36.0–46.0)
Hemoglobin: 13.4 g/dL (ref 12.0–15.0)
Lymphocytes Relative: 16.5 % (ref 12.0–46.0)
Lymphs Abs: 1 10*3/uL (ref 0.7–4.0)
MCHC: 32.5 g/dL (ref 30.0–36.0)
MCV: 102 fl — AB (ref 78.0–100.0)
Monocytes Absolute: 0.4 10*3/uL (ref 0.1–1.0)
Monocytes Relative: 5.9 % (ref 3.0–12.0)
NEUTROS PCT: 75.3 % (ref 43.0–77.0)
Neutro Abs: 4.6 10*3/uL (ref 1.4–7.7)
PLATELETS: 126 10*3/uL — AB (ref 150.0–400.0)
RBC: 4.05 Mil/uL (ref 3.87–5.11)
RDW: 16.2 % — ABNORMAL HIGH (ref 11.5–14.6)
WBC: 6.1 10*3/uL (ref 4.5–10.5)

## 2013-02-27 LAB — POCT INR: INR: 2.5

## 2013-02-27 NOTE — Assessment & Plan Note (Signed)
Continue statin. Not on aspirin given need for Coumadin. 

## 2013-02-27 NOTE — Patient Instructions (Signed)
Your physician wants you to follow-up in: 6 MONTHS WITH DR CRENSHAW You will receive a reminder letter in the mail two months in advance. If you don't receive a letter, please call our office to schedule the follow-up appointment.  

## 2013-02-27 NOTE — Assessment & Plan Note (Signed)
Blood pressure controlled. Continue present medications. 
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
## 2013-02-27 NOTE — Assessment & Plan Note (Signed)
Followed by nephrology. 

## 2013-02-27 NOTE — Assessment & Plan Note (Signed)
Followed by electrophysiology. 

## 2013-02-27 NOTE — Assessment & Plan Note (Signed)
Continue statin. 

## 2013-02-27 NOTE — Assessment & Plan Note (Addendum)
Continue beta blocker and Coumadin. Check hemoglobin. 

## 2013-03-26 ENCOUNTER — Encounter: Payer: Self-pay | Admitting: Internal Medicine

## 2013-03-26 ENCOUNTER — Ambulatory Visit (INDEPENDENT_AMBULATORY_CARE_PROVIDER_SITE_OTHER): Payer: Medicare Other | Admitting: *Deleted

## 2013-03-26 ENCOUNTER — Ambulatory Visit (INDEPENDENT_AMBULATORY_CARE_PROVIDER_SITE_OTHER): Payer: Medicare Other | Admitting: Internal Medicine

## 2013-03-26 VITALS — BP 142/76 | HR 80 | Ht 63.0 in | Wt 144.0 lb

## 2013-03-26 DIAGNOSIS — I4891 Unspecified atrial fibrillation: Secondary | ICD-10-CM

## 2013-03-26 DIAGNOSIS — Z7901 Long term (current) use of anticoagulants: Secondary | ICD-10-CM

## 2013-03-26 DIAGNOSIS — I495 Sick sinus syndrome: Secondary | ICD-10-CM

## 2013-03-26 DIAGNOSIS — Z950 Presence of cardiac pacemaker: Secondary | ICD-10-CM

## 2013-03-26 DIAGNOSIS — Z5181 Encounter for therapeutic drug level monitoring: Secondary | ICD-10-CM

## 2013-03-26 LAB — MDC_IDC_ENUM_SESS_TYPE_INCLINIC
Battery Impedance: 134 Ohm
Battery Voltage: 2.79 V
Brady Statistic RV Percent Paced: 74 %
Date Time Interrogation Session: 20150304122315
Lead Channel Impedance Value: 532 Ohm
Lead Channel Pacing Threshold Pulse Width: 0.4 ms
Lead Channel Sensing Intrinsic Amplitude: 15.67 mV
Lead Channel Setting Pacing Pulse Width: 0.4 ms
Lead Channel Setting Sensing Sensitivity: 4 mV
MDC IDC MSMT BATTERY REMAINING LONGEVITY: 108 mo
MDC IDC MSMT LEADCHNL RA IMPEDANCE VALUE: 0 Ohm
MDC IDC MSMT LEADCHNL RV PACING THRESHOLD AMPLITUDE: 1.25 V
MDC IDC SET LEADCHNL RV PACING AMPLITUDE: 2.5 V

## 2013-03-26 LAB — POCT INR: INR: 1.6
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
## 2013-03-26 NOTE — Patient Instructions (Signed)
Your physician wants you to follow-up in: 6 months in device clinic and 12 months with Dr. Allred. You will receive a reminder letter in the mail two months in advance. If you don't receive a letter, please call our office to schedule the follow-up appointment.     

## 2013-05-07 ENCOUNTER — Ambulatory Visit (INDEPENDENT_AMBULATORY_CARE_PROVIDER_SITE_OTHER): Payer: Medicare Other | Admitting: *Deleted

## 2013-05-07 DIAGNOSIS — Z7901 Long term (current) use of anticoagulants: Secondary | ICD-10-CM

## 2013-05-07 DIAGNOSIS — Z5181 Encounter for therapeutic drug level monitoring: Secondary | ICD-10-CM

## 2013-05-07 DIAGNOSIS — I4891 Unspecified atrial fibrillation: Secondary | ICD-10-CM

## 2013-05-07 LAB — POCT INR: INR: 1.9

## 2013-05-21 ENCOUNTER — Ambulatory Visit (INDEPENDENT_AMBULATORY_CARE_PROVIDER_SITE_OTHER): Payer: Medicare Other | Admitting: Pharmacist

## 2013-05-21 DIAGNOSIS — Z5181 Encounter for therapeutic drug level monitoring: Secondary | ICD-10-CM

## 2013-05-21 DIAGNOSIS — I4891 Unspecified atrial fibrillation: Secondary | ICD-10-CM

## 2013-05-21 DIAGNOSIS — Z7901 Long term (current) use of anticoagulants: Secondary | ICD-10-CM

## 2013-05-21 LAB — POCT INR: INR: 3.2

## 2013-06-05 ENCOUNTER — Ambulatory Visit (INDEPENDENT_AMBULATORY_CARE_PROVIDER_SITE_OTHER): Payer: Medicare Other | Admitting: Pharmacist

## 2013-06-05 DIAGNOSIS — Z7901 Long term (current) use of anticoagulants: Secondary | ICD-10-CM

## 2013-06-05 DIAGNOSIS — I4891 Unspecified atrial fibrillation: Secondary | ICD-10-CM

## 2013-06-05 DIAGNOSIS — Z5181 Encounter for therapeutic drug level monitoring: Secondary | ICD-10-CM

## 2013-06-05 LAB — POCT INR: INR: 2.2

## 2013-06-20 ENCOUNTER — Other Ambulatory Visit: Payer: Self-pay | Admitting: Dermatology

## 2013-06-25 ENCOUNTER — Encounter: Payer: Self-pay | Admitting: Internal Medicine

## 2013-07-08 ENCOUNTER — Ambulatory Visit (INDEPENDENT_AMBULATORY_CARE_PROVIDER_SITE_OTHER): Payer: Medicare Other | Admitting: Pharmacist Clinician (PhC)/ Clinical Pharmacy Specialist

## 2013-07-08 DIAGNOSIS — Z7901 Long term (current) use of anticoagulants: Secondary | ICD-10-CM

## 2013-07-08 DIAGNOSIS — Z5181 Encounter for therapeutic drug level monitoring: Secondary | ICD-10-CM

## 2013-07-08 DIAGNOSIS — I4891 Unspecified atrial fibrillation: Secondary | ICD-10-CM

## 2013-07-08 LAB — POCT INR: INR: 2.1

## 2013-07-28 ENCOUNTER — Ambulatory Visit (INDEPENDENT_AMBULATORY_CARE_PROVIDER_SITE_OTHER): Payer: Medicare Other

## 2013-07-28 ENCOUNTER — Ambulatory Visit (INDEPENDENT_AMBULATORY_CARE_PROVIDER_SITE_OTHER): Payer: Medicare Other | Admitting: *Deleted

## 2013-07-28 VITALS — BP 122/84 | HR 64 | Wt 138.1 lb

## 2013-07-28 DIAGNOSIS — R42 Dizziness and giddiness: Secondary | ICD-10-CM

## 2013-07-28 DIAGNOSIS — Z7901 Long term (current) use of anticoagulants: Secondary | ICD-10-CM

## 2013-07-28 DIAGNOSIS — I4891 Unspecified atrial fibrillation: Secondary | ICD-10-CM

## 2013-07-28 DIAGNOSIS — Z5181 Encounter for therapeutic drug level monitoring: Secondary | ICD-10-CM

## 2013-07-28 LAB — POCT INR: INR: 2

## 2013-07-28 NOTE — Progress Notes (Signed)
1.) Reason for visit: lightheaded   2.) Name of MD requesting visit: Crenshaw   3.) H&P: Patient came in today for INR and was complaining of feeling lightheaded/dizzy when she was shopping today. She was looking at shoes and got dizzy/lightheaded when she stood up. Spoke with and this has been going on for over a month but today worse. Patient denied any chest pains or increased shortness of breath. Patient did eat today. Did ask patient if worse with movement and she was not sure. Blood pressure 122/84, heart rate 64, and weight 138.12 pounds. Discussed with Dr Eldridge Dace and EKG reviewed. Will have patient make sure to stay hydrated and if no better call and get earlier appointment for pacer check and follow up with PCP. Advised patient of recommendations and she stated she had seen her PCP 2 weeks ago for this. Advised she needed to follow up with him/her if no better, if worse go to ED. Patient verbalized understanding.  4.) Assessment and plan per MD:

## 2013-08-25 ENCOUNTER — Ambulatory Visit (INDEPENDENT_AMBULATORY_CARE_PROVIDER_SITE_OTHER): Payer: Medicare Other

## 2013-08-25 DIAGNOSIS — Z5181 Encounter for therapeutic drug level monitoring: Secondary | ICD-10-CM

## 2013-08-25 DIAGNOSIS — Z7901 Long term (current) use of anticoagulants: Secondary | ICD-10-CM

## 2013-08-25 DIAGNOSIS — I4891 Unspecified atrial fibrillation: Secondary | ICD-10-CM

## 2013-08-25 LAB — POCT INR: INR: 3.2

## 2013-09-09 ENCOUNTER — Telehealth: Payer: Self-pay | Admitting: Cardiology

## 2013-09-09 NOTE — Telephone Encounter (Signed)
Patient states that she is returning a call to [place] and would like to be called back.

## 2013-09-09 NOTE — Telephone Encounter (Signed)
Pt says she feel real weak,she says that she might not be eating enough.Also wonder if her blood is too thin,just trying to figure our why she feels this way.

## 2013-09-09 NOTE — Telephone Encounter (Signed)
Last weak had a sinus infection and started on an antibiotic. INR >3  @ 2 weeks ago.  C/o weakness and thinks she is not eating enough.  Lost husband just before Christmas and hasn't eaten well since.  After eating lunch today she felt better.  Has an appt with Belenda Cruise next week.

## 2013-09-16 ENCOUNTER — Encounter: Payer: Self-pay | Admitting: Cardiology

## 2013-09-16 ENCOUNTER — Ambulatory Visit (INDEPENDENT_AMBULATORY_CARE_PROVIDER_SITE_OTHER): Payer: Medicare Other | Admitting: Pharmacist Clinician (PhC)/ Clinical Pharmacy Specialist

## 2013-09-16 ENCOUNTER — Ambulatory Visit (INDEPENDENT_AMBULATORY_CARE_PROVIDER_SITE_OTHER): Payer: Medicare Other | Admitting: Cardiology

## 2013-09-16 VITALS — BP 142/82 | HR 89 | Ht 63.0 in | Wt 141.0 lb

## 2013-09-16 DIAGNOSIS — I482 Chronic atrial fibrillation, unspecified: Secondary | ICD-10-CM

## 2013-09-16 DIAGNOSIS — I4891 Unspecified atrial fibrillation: Secondary | ICD-10-CM

## 2013-09-16 DIAGNOSIS — Z7901 Long term (current) use of anticoagulants: Secondary | ICD-10-CM

## 2013-09-16 DIAGNOSIS — Z5181 Encounter for therapeutic drug level monitoring: Secondary | ICD-10-CM

## 2013-09-16 DIAGNOSIS — I2510 Atherosclerotic heart disease of native coronary artery without angina pectoris: Secondary | ICD-10-CM

## 2013-09-16 DIAGNOSIS — Z950 Presence of cardiac pacemaker: Secondary | ICD-10-CM

## 2013-09-16 DIAGNOSIS — I10 Essential (primary) hypertension: Secondary | ICD-10-CM

## 2013-09-16 DIAGNOSIS — N184 Chronic kidney disease, stage 4 (severe): Secondary | ICD-10-CM

## 2013-09-16 DIAGNOSIS — E785 Hyperlipidemia, unspecified: Secondary | ICD-10-CM

## 2013-09-16 LAB — POCT INR: INR: 2.1
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
## 2013-09-16 NOTE — Patient Instructions (Signed)
Your physician wants you to follow-up in: 6 MONTHS WITH DR CRENSHAW You will receive a reminder letter in the mail two months in advance. If you don't receive a letter, please call our office to schedule the follow-up appointment.  

## 2013-09-16 NOTE — Assessment & Plan Note (Signed)
Continue present blood pressure medications. 

## 2013-09-16 NOTE — Assessment & Plan Note (Signed)
Followed by nephrology. 

## 2013-09-16 NOTE — Assessment & Plan Note (Signed)
Continue statin. Not on aspirin given need for Coumadin. 

## 2013-09-16 NOTE — Assessment & Plan Note (Signed)
Followed by electrophysiology. 

## 2013-09-16 NOTE — Assessment & Plan Note (Signed)
Continue statin. 

## 2013-09-16 NOTE — Assessment & Plan Note (Signed)
Continue beta blocker and Coumadin. 

## 2013-10-15 ENCOUNTER — Ambulatory Visit: Payer: Medicare Other | Admitting: Pharmacist Clinician (PhC)/ Clinical Pharmacy Specialist

## 2013-10-20 ENCOUNTER — Ambulatory Visit (INDEPENDENT_AMBULATORY_CARE_PROVIDER_SITE_OTHER): Payer: Medicare Other | Admitting: Cardiovascular Disease

## 2013-10-20 ENCOUNTER — Encounter: Payer: Self-pay | Admitting: Cardiovascular Disease

## 2013-10-20 ENCOUNTER — Ambulatory Visit (INDEPENDENT_AMBULATORY_CARE_PROVIDER_SITE_OTHER): Payer: Medicare Other | Admitting: Pharmacist Clinician (PhC)/ Clinical Pharmacy Specialist

## 2013-10-20 VITALS — BP 129/74 | HR 87 | Resp 20 | Ht 63.0 in | Wt 140.5 lb

## 2013-10-20 DIAGNOSIS — N184 Chronic kidney disease, stage 4 (severe): Secondary | ICD-10-CM

## 2013-10-20 DIAGNOSIS — R0602 Shortness of breath: Secondary | ICD-10-CM

## 2013-10-20 DIAGNOSIS — Z79899 Other long term (current) drug therapy: Secondary | ICD-10-CM

## 2013-10-20 DIAGNOSIS — Z950 Presence of cardiac pacemaker: Secondary | ICD-10-CM

## 2013-10-20 DIAGNOSIS — Z5181 Encounter for therapeutic drug level monitoring: Secondary | ICD-10-CM

## 2013-10-20 DIAGNOSIS — I4891 Unspecified atrial fibrillation: Secondary | ICD-10-CM

## 2013-10-20 DIAGNOSIS — I482 Chronic atrial fibrillation, unspecified: Secondary | ICD-10-CM

## 2013-10-20 DIAGNOSIS — Z7901 Long term (current) use of anticoagulants: Secondary | ICD-10-CM

## 2013-10-20 DIAGNOSIS — I2510 Atherosclerotic heart disease of native coronary artery without angina pectoris: Secondary | ICD-10-CM

## 2013-10-20 LAB — POCT INR: INR: 2.3

## 2013-10-20 NOTE — Assessment & Plan Note (Signed)
Evidence of inferior wall hypokinesis by echo and history of previous stents to the right coronary artery. Currently asymptomatic.

## 2013-10-20 NOTE — Assessment & Plan Note (Addendum)
Permanent atrial fibrillation with slow ventricular response on chronic warfarin anticoagulation. She is on a very high dose of metoprolol. This is probably being used for blood pressure control, but reducing the dose of metoprolol may be beneficial to reduce the frequency of ventricular pacing. This is desirable since she may have better ventricular performance, but also since it will substantially improved battery longevity (she requires high pacing outputs to 2 elevated right ventricular lead thresholds).

## 2013-10-20 NOTE — Assessment & Plan Note (Signed)
Her pacemaker is functioning appropriately but she shows gradual increase in right ventricular pacing thresholds and may require further increase in outputs over time. Consider reducing beta blocker dose to reduce need for pacing.

## 2013-10-20 NOTE — Assessment & Plan Note (Signed)
She is on high dose of loop diuretic primarily because of chronic kidney disease. She has no edema and no symptoms or signs of congestive heart failure. She complains of being very thirsty. Check a metabolic panel and a brain natruretic peptide level today. Consider reduction in furosemide dose if appropriate.
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
## 2013-10-20 NOTE — Patient Instructions (Signed)
Your physician recommends that you return for lab work in: TODAY  AT [facility].  FOLLOW-UP WITH DR. CRENSHAW AS DIRECTED.

## 2013-10-21 LAB — BASIC METABOLIC PANEL
BUN: 39 mg/dL — ABNORMAL HIGH (ref 6–23)
CALCIUM: 9.8 mg/dL (ref 8.4–10.5)
CO2: 25 meq/L (ref 19–32)
CREATININE: 2.34 mg/dL — AB (ref 0.50–1.10)
Chloride: 98 mEq/L (ref 96–112)
GLUCOSE: 90 mg/dL (ref 70–99)
Potassium: 4.1 mEq/L (ref 3.5–5.3)
Sodium: 136 mEq/L (ref 135–145)

## 2013-10-21 LAB — BRAIN NATRIURETIC PEPTIDE: Brain Natriuretic Peptide: 222.1 pg/mL — ABNORMAL HIGH (ref 0.0–100.0)

## 2013-10-28 LAB — MDC_IDC_ENUM_SESS_TYPE_INCLINIC
Battery Remaining Longevity: 6
Battery Voltage: 2.78 V
Lead Channel Pacing Threshold Amplitude: 1.75 V
Lead Channel Pacing Threshold Pulse Width: 0.4 ms
Lead Channel Sensing Intrinsic Amplitude: 11.2 mV
Lead Channel Setting Pacing Pulse Width: 0.4 ms
MDC IDC MSMT BATTERY IMPEDANCE: 206 Ohm
MDC IDC MSMT LEADCHNL RV IMPEDANCE VALUE: 562 Ohm
MDC IDC SET LEADCHNL RV PACING AMPLITUDE: 4 V
MDC IDC SET LEADCHNL RV SENSING SENSITIVITY: 4 mV
MDC IDC STAT BRADY RV PERCENT PACED: 82.7 %

## 2013-10-30 ENCOUNTER — Encounter: Payer: Self-pay | Admitting: Internal Medicine

## 2013-11-28 ENCOUNTER — Ambulatory Visit (INDEPENDENT_AMBULATORY_CARE_PROVIDER_SITE_OTHER): Payer: Medicare Other | Admitting: Pharmacist

## 2013-11-28 DIAGNOSIS — I482 Chronic atrial fibrillation, unspecified: Secondary | ICD-10-CM

## 2013-11-28 DIAGNOSIS — Z7901 Long term (current) use of anticoagulants: Secondary | ICD-10-CM

## 2013-11-28 DIAGNOSIS — I4891 Unspecified atrial fibrillation: Secondary | ICD-10-CM

## 2013-11-28 DIAGNOSIS — Z5181 Encounter for therapeutic drug level monitoring: Secondary | ICD-10-CM

## 2013-11-28 LAB — POCT INR: INR: 1.3

## 2013-12-10 ENCOUNTER — Ambulatory Visit (INDEPENDENT_AMBULATORY_CARE_PROVIDER_SITE_OTHER): Payer: Medicare Other | Admitting: Pharmacist

## 2013-12-10 DIAGNOSIS — I4891 Unspecified atrial fibrillation: Secondary | ICD-10-CM

## 2013-12-10 DIAGNOSIS — Z5181 Encounter for therapeutic drug level monitoring: Secondary | ICD-10-CM

## 2013-12-10 DIAGNOSIS — I482 Chronic atrial fibrillation, unspecified: Secondary | ICD-10-CM

## 2013-12-10 DIAGNOSIS — Z7901 Long term (current) use of anticoagulants: Secondary | ICD-10-CM

## 2013-12-10 LAB — POCT INR: INR: 2.2

## 2014-01-01 ENCOUNTER — Encounter (HOSPITAL_COMMUNITY): Payer: Self-pay | Admitting: Internal Medicine

## 2014-01-07 ENCOUNTER — Ambulatory Visit (INDEPENDENT_AMBULATORY_CARE_PROVIDER_SITE_OTHER): Payer: Medicare Other | Admitting: *Deleted

## 2014-01-07 DIAGNOSIS — Z5181 Encounter for therapeutic drug level monitoring: Secondary | ICD-10-CM

## 2014-01-07 DIAGNOSIS — I4891 Unspecified atrial fibrillation: Secondary | ICD-10-CM

## 2014-01-07 DIAGNOSIS — I482 Chronic atrial fibrillation, unspecified: Secondary | ICD-10-CM

## 2014-01-07 DIAGNOSIS — Z7901 Long term (current) use of anticoagulants: Secondary | ICD-10-CM

## 2014-01-07 LAB — POCT INR: INR: 1.8

## 2014-01-22 ENCOUNTER — Ambulatory Visit (INDEPENDENT_AMBULATORY_CARE_PROVIDER_SITE_OTHER): Payer: Medicare Other | Admitting: Surgery

## 2014-01-22 DIAGNOSIS — I482 Chronic atrial fibrillation, unspecified: Secondary | ICD-10-CM

## 2014-01-22 DIAGNOSIS — Z5181 Encounter for therapeutic drug level monitoring: Secondary | ICD-10-CM

## 2014-01-22 DIAGNOSIS — I4891 Unspecified atrial fibrillation: Secondary | ICD-10-CM

## 2014-01-22 DIAGNOSIS — Z7901 Long term (current) use of anticoagulants: Secondary | ICD-10-CM

## 2014-01-22 LAB — POCT INR: INR: 1.6

## 2014-02-05 ENCOUNTER — Ambulatory Visit (INDEPENDENT_AMBULATORY_CARE_PROVIDER_SITE_OTHER): Payer: Medicare Other

## 2014-02-05 DIAGNOSIS — I4891 Unspecified atrial fibrillation: Secondary | ICD-10-CM

## 2014-02-05 DIAGNOSIS — Z5181 Encounter for therapeutic drug level monitoring: Secondary | ICD-10-CM

## 2014-02-05 DIAGNOSIS — Z7901 Long term (current) use of anticoagulants: Secondary | ICD-10-CM

## 2014-02-05 LAB — POCT INR: INR: 1.7

## 2014-02-16 ENCOUNTER — Encounter: Payer: Self-pay | Admitting: Internal Medicine

## 2014-02-19 ENCOUNTER — Ambulatory Visit (INDEPENDENT_AMBULATORY_CARE_PROVIDER_SITE_OTHER): Payer: Medicare Other | Admitting: Pharmacist

## 2014-02-19 DIAGNOSIS — Z5181 Encounter for therapeutic drug level monitoring: Secondary | ICD-10-CM

## 2014-02-19 DIAGNOSIS — I4891 Unspecified atrial fibrillation: Secondary | ICD-10-CM

## 2014-02-19 DIAGNOSIS — Z7901 Long term (current) use of anticoagulants: Secondary | ICD-10-CM

## 2014-02-19 LAB — POCT INR: INR: 2

## 2014-03-05 ENCOUNTER — Ambulatory Visit (INDEPENDENT_AMBULATORY_CARE_PROVIDER_SITE_OTHER): Payer: Medicare Other | Admitting: Pharmacist Clinician (PhC)/ Clinical Pharmacy Specialist

## 2014-03-05 DIAGNOSIS — I4891 Unspecified atrial fibrillation: Secondary | ICD-10-CM

## 2014-03-05 DIAGNOSIS — Z5181 Encounter for therapeutic drug level monitoring: Secondary | ICD-10-CM

## 2014-03-05 DIAGNOSIS — Z7901 Long term (current) use of anticoagulants: Secondary | ICD-10-CM

## 2014-03-05 LAB — POCT INR: INR: 2.7
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
## 2014-03-20 ENCOUNTER — Ambulatory Visit (INDEPENDENT_AMBULATORY_CARE_PROVIDER_SITE_OTHER): Payer: Medicare Other | Admitting: Cardiology

## 2014-03-20 ENCOUNTER — Encounter: Payer: Self-pay | Admitting: Cardiology

## 2014-03-20 ENCOUNTER — Ambulatory Visit (INDEPENDENT_AMBULATORY_CARE_PROVIDER_SITE_OTHER): Payer: Medicare Other | Admitting: Pharmacist Clinician (PhC)/ Clinical Pharmacy Specialist

## 2014-03-20 VITALS — BP 110/76 | HR 66 | Ht 63.5 in | Wt 142.9 lb

## 2014-03-20 DIAGNOSIS — Z5181 Encounter for therapeutic drug level monitoring: Secondary | ICD-10-CM

## 2014-03-20 DIAGNOSIS — I4891 Unspecified atrial fibrillation: Secondary | ICD-10-CM

## 2014-03-20 DIAGNOSIS — Z7901 Long term (current) use of anticoagulants: Secondary | ICD-10-CM

## 2014-03-20 DIAGNOSIS — N184 Chronic kidney disease, stage 4 (severe): Secondary | ICD-10-CM

## 2014-03-20 DIAGNOSIS — Z950 Presence of cardiac pacemaker: Secondary | ICD-10-CM

## 2014-03-20 DIAGNOSIS — I2510 Atherosclerotic heart disease of native coronary artery without angina pectoris: Secondary | ICD-10-CM

## 2014-03-20 DIAGNOSIS — I10 Essential (primary) hypertension: Secondary | ICD-10-CM

## 2014-03-20 LAB — POCT INR: INR: 2.2

## 2014-03-20 NOTE — Assessment & Plan Note (Signed)
Continue statin. 

## 2014-03-20 NOTE — Patient Instructions (Signed)
Your physician recommends that you schedule a follow-up appointment in: 3 MONTHS WITH DR CRENSHAW  INCREASE FUROSEMIDE TO 160 MG TWICE DAILY X 3 DAYS THEN DECREASE TO 160 MG IN THE AM AND 80 MG IN THE PM  Your physician has requested that you have an echocardiogram. Echocardiography is a painless test that uses sound waves to create images of your heart. It provides your doctor with information about the size and shape of your heart and how well your heart's chambers and valves are working. This procedure takes approximately one hour. There are no restrictions for this procedure.   Your physician recommends that you HAVE LAB WORK ON Tuesday NEXT WEEK

## 2014-03-20 NOTE — Assessment & Plan Note (Signed)
Followed by electrophysiology. 

## 2014-03-20 NOTE — Assessment & Plan Note (Signed)
Continue beta blocker and Coumadin. 

## 2014-03-20 NOTE — Assessment & Plan Note (Signed)
Continue statin.not on aspirin given need for Coumadin. 

## 2014-03-20 NOTE — Assessment & Plan Note (Signed)
Patient is complaining of increased dyspnea for 1 week. Increase Lasix to 160 mg twice a day for 3 days and then resume 160 mg in the morning and 80 in the evening. Check potassium and renal function early next week.

## 2014-03-20 NOTE — Assessment & Plan Note (Signed)
Blood pressure controlled. Continue present medications. 

## 2014-03-25 LAB — BASIC METABOLIC PANEL WITH GFR
BUN: 30 mg/dL — AB (ref 6–23)
CALCIUM: 9.3 mg/dL (ref 8.4–10.5)
CO2: 28 mEq/L (ref 19–32)
Chloride: 101 mEq/L (ref 96–112)
Creat: 1.73 mg/dL — ABNORMAL HIGH (ref 0.50–1.10)
GFR, Est African American: 30 mL/min — ABNORMAL LOW
GFR, Est Non African American: 26 mL/min — ABNORMAL LOW
GLUCOSE: 107 mg/dL — AB (ref 70–99)
Potassium: 4 mEq/L (ref 3.5–5.3)
SODIUM: 137 meq/L (ref 135–145)

## 2014-04-01 ENCOUNTER — Ambulatory Visit (HOSPITAL_COMMUNITY)
Admission: RE | Admit: 2014-04-01 | Discharge: 2014-04-01 | Disposition: A | Discharge status: Home / Self Care | Payer: Medicare Other | Source: Ambulatory Visit | Attending: Cardiology | Admitting: Cardiology

## 2014-04-01 DIAGNOSIS — I4891 Unspecified atrial fibrillation: Secondary | ICD-10-CM | POA: Diagnosis present

## 2014-04-01 NOTE — Progress Notes (Signed)
2D Echocardiogram Complete.  04/01/2014   Alexzandrea Normington, RDCS  

## 2014-04-08 ENCOUNTER — Encounter: Payer: Medicare Other | Admitting: Internal Medicine

## 2014-04-29 ENCOUNTER — Encounter: Payer: Self-pay | Admitting: Internal Medicine

## 2014-04-29 ENCOUNTER — Ambulatory Visit (INDEPENDENT_AMBULATORY_CARE_PROVIDER_SITE_OTHER): Payer: Medicare Other | Admitting: Internal Medicine

## 2014-04-29 VITALS — BP 110/76 | HR 73 | Ht 63.75 in | Wt 140.6 lb

## 2014-04-29 DIAGNOSIS — I495 Sick sinus syndrome: Secondary | ICD-10-CM

## 2014-04-29 DIAGNOSIS — I482 Chronic atrial fibrillation, unspecified: Secondary | ICD-10-CM

## 2014-04-29 LAB — MDC_IDC_ENUM_SESS_TYPE_INCLINIC
Battery Impedance: 353 Ohm
Battery Remaining Longevity: 37 mo
Battery Voltage: 2.76 V
Brady Statistic RV Percent Paced: 67 %
Lead Channel Impedance Value: 552 Ohm
Lead Channel Sensing Intrinsic Amplitude: 11.2 mV
Lead Channel Setting Pacing Pulse Width: 1 ms
Lead Channel Setting Sensing Sensitivity: 4 mV
MDC IDC MSMT LEADCHNL RA IMPEDANCE VALUE: 0 Ohm
MDC IDC MSMT LEADCHNL RV PACING THRESHOLD AMPLITUDE: 1.5 V
MDC IDC MSMT LEADCHNL RV PACING THRESHOLD PULSEWIDTH: 1 ms
MDC IDC SESS DTM: 20160406141923
MDC IDC SET LEADCHNL RV PACING AMPLITUDE: 3 V
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
## 2014-04-29 NOTE — Patient Instructions (Addendum)
Your physician recommends that you schedule a follow-up appointment in: 6 months with the Device Clinic and in 12 months with Amber Seiler, NP for Dr. Allred.   Your physician recommends that you continue on your current medications as directed. Please refer to the Current Medication list given to you today.  

## 2014-05-05 ENCOUNTER — Telehealth: Payer: Self-pay | Admitting: Internal Medicine

## 2014-05-05 NOTE — Telephone Encounter (Signed)
Pt c/o Shortness Of Breath: STAT if SOB developed within the last 24 hours or pt is noticeably SOB on the phone  1. Are you currently SOB (can you hear that pt is SOB on the phone)?No 2. How long have you been experiencing SOB? Since her Pacer was changed 3. Are you SOB when sitting or when up moving around? Moving around  4.  Are you currently experiencing any other symptoms?   Comments: Pt called states that she was in last week. Was told battery would last only 3 years but since her battery was changed she is not feeling well at all. Bp is high and experiencing SOB.

## 2014-05-05 NOTE — Telephone Encounter (Signed)
Pt states since  office visit with Dr Johney Frame 04/29/14 she has been SOB with walking or moving around, no other time.  Pt denies any increase in weight/edema or other heart failure signs.  Pt states she does have a history of HTN but does not feel like her BP is high right now although she has not checked it.  Pt feels SOB is related to changes made to [facility] 04/29/14.  Pt advised I will forward to Dr Allred/Kelly/Device for review and recommendations.

## 2014-05-05 NOTE — Telephone Encounter (Signed)
Spoke w/pt and scheduled pt for 05-06-14 at 1200 to change from unipolar to bipolar. Pt SOB since changes were made.

## 2014-05-05 NOTE — Telephone Encounter (Signed)
Given new onset symptoms that should not be explained by polarity change, I would favor putting on either EP NP or MD schedule 05/06/14 rather than device clinic.

## 2014-05-06 ENCOUNTER — Ambulatory Visit (INDEPENDENT_AMBULATORY_CARE_PROVIDER_SITE_OTHER): Payer: Medicare Other | Admitting: Internal Medicine

## 2014-05-06 ENCOUNTER — Encounter: Payer: Self-pay | Admitting: Internal Medicine

## 2014-05-06 VITALS — BP 154/76 | HR 83 | Ht 63.5 in | Wt 141.8 lb

## 2014-05-06 DIAGNOSIS — I495 Sick sinus syndrome: Secondary | ICD-10-CM | POA: Diagnosis not present

## 2014-05-06 DIAGNOSIS — I482 Chronic atrial fibrillation, unspecified: Secondary | ICD-10-CM

## 2014-05-06 LAB — MDC_IDC_ENUM_SESS_TYPE_INCLINIC
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
## 2014-05-06 NOTE — Patient Instructions (Signed)
Medication Instructions: - no changes today  Labwork: - none  Procedures/Testing: - none  Follow-Up: - with Dr. Jens Som as scheduled  - Your physician recommends that you schedule a follow-up appointment in: 3 months with the device clinic.  - Your physician wants you to follow-up in: 1 year with Dr. Johney Frame. You will receive a reminder letter in the mail two months in advance. If you don't receive a letter, please call our office to schedule the follow-up appointment.  Any Additional Special Instructions Will Be Listed Below (If Applicable). - none

## 2014-06-01 ENCOUNTER — Other Ambulatory Visit: Payer: Self-pay | Admitting: Internal Medicine

## 2014-06-01 ENCOUNTER — Ambulatory Visit
Admission: RE | Admit: 2014-06-01 | Discharge: 2014-06-01 | Disposition: A | Discharge status: Home / Self Care | Payer: Medicare Other | Source: Ambulatory Visit | Attending: Internal Medicine | Admitting: Internal Medicine

## 2014-06-01 DIAGNOSIS — R55 Syncope and collapse: Secondary | ICD-10-CM

## 2014-06-08 ENCOUNTER — Ambulatory Visit: Payer: Medicare Other | Admitting: Nurse Practitioner

## 2014-06-08 ENCOUNTER — Telehealth: Payer: Self-pay | Admitting: Internal Medicine

## 2014-06-08 ENCOUNTER — Encounter: Payer: Self-pay | Admitting: Cardiology

## 2014-06-08 NOTE — Telephone Encounter (Signed)
This encounter was created in error - please disregard.

## 2014-06-08 NOTE — Telephone Encounter (Signed)
Patient called back asking why she is being seen today.

## 2014-06-08 NOTE — Telephone Encounter (Signed)
Patient had episode last week (Monday, 5/9) where she said she felt weak, head spinning, palpitations and she fell against the wall. She went to PCP. BP 190/100, HR 57. She also saw her nephrologist, who adjusted her Metoprolol. She feels she is still having same sx and she is relating them to the recent pacer changes. Dr. Johney Frame reviewed. He stated that he has already addressed this with her twice and he does not feel her sx are related to the pacer or pacer changes. He referred her to Dr. Jens Som. Dr. Jens Som is not in office today, so patient is referred to see Flex provider today at 3pm in [facility] office. Patient is in agreement with this appointment. She states that today her BP is 149/75, HR 57 and she is still feeling palpitations and is slightly SOB, especially after eating. She will be seen at 3pm today.

## 2014-06-08 NOTE — Telephone Encounter (Signed)
Patient c/o Palpitations:  High priority if patient c/o lightheadedness and shortness of breath.  1. How long have you been having palpitations? This morning  2. Are you currently experiencing lightheadedness and shortness of breath? No  3. Have you checked your BP and heart rate? (document readings) Yes but she didn't remember  4. Are you experiencing any other symptoms? No

## 2014-06-08 NOTE — Telephone Encounter (Signed)
Discussed with patient that since she had reported not feeling well (palpitations, weakness, dizziness, elevated BP) that we had offered her to be seen today so that those issues could be addressed. Patient states she does not want to pay the $40 copay today and that she thinks she is feeling well enough at this time to wait. She will call us back should she change her mind and prefer to be seen. She does have an appointment with Dr. Jens Som this month on 5/27. She said at that appointment she will address the above issues. Advised her to call office back should she have any other concerns or worsening sx. She verbalized understanding and agreement.

## 2014-06-10 ENCOUNTER — Telehealth: Payer: Self-pay | Admitting: Cardiology

## 2014-06-11 NOTE — Telephone Encounter (Signed)
Close encounter 
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
## 2014-06-12 ENCOUNTER — Encounter: Payer: Self-pay | Admitting: Cardiology

## 2014-06-12 ENCOUNTER — Ambulatory Visit (INDEPENDENT_AMBULATORY_CARE_PROVIDER_SITE_OTHER): Payer: Medicare Other | Admitting: Cardiology

## 2014-06-12 VITALS — BP 172/78 | HR 60 | Ht 64.0 in | Wt 139.0 lb

## 2014-06-12 DIAGNOSIS — I2510 Atherosclerotic heart disease of native coronary artery without angina pectoris: Secondary | ICD-10-CM | POA: Diagnosis not present

## 2014-06-12 DIAGNOSIS — I482 Chronic atrial fibrillation, unspecified: Secondary | ICD-10-CM

## 2014-06-12 DIAGNOSIS — Z950 Presence of cardiac pacemaker: Secondary | ICD-10-CM | POA: Diagnosis not present

## 2014-06-12 DIAGNOSIS — I10 Essential (primary) hypertension: Secondary | ICD-10-CM

## 2014-06-12 DIAGNOSIS — N184 Chronic kidney disease, stage 4 (severe): Secondary | ICD-10-CM

## 2014-06-12 MED ORDER — AMLODIPINE BESYLATE 2.5 MG PO TABS: 2.5000 mg | ORAL_TABLET | Freq: Every day | ORAL | Status: DC

## 2014-06-12 NOTE — Assessment & Plan Note (Signed)
Blood pressure mildly elevated. Add amlodipine 2.5 mg daily and increase as needed.

## 2014-06-12 NOTE — Assessment & Plan Note (Signed)
Not on aspirin given need for Coumadin. 

## 2014-06-12 NOTE — Assessment & Plan Note (Signed)
Patient is having worsening palpitations and fatigue since her pacemaker was reprogrammed. Have asked her to discuss this with Dr. Johney Frame.

## 2014-06-12 NOTE — Assessment & Plan Note (Signed)
Continue beta blocker and Coumadin. 

## 2014-06-12 NOTE — Patient Instructions (Signed)
Your physician wants you to follow-up in: 6 Months. You will receive a reminder letter in the mail two months in advance. If you don't receive a letter, please call our office to schedule the follow-up appointment.  Your physician has recommended you make the following change in your medication: START Amlodipine 2.5 mg daily   

## 2014-06-12 NOTE — Assessment & Plan Note (Signed)
Continue present dose of Lasix. Followed by nephrology.

## 2014-06-15 ENCOUNTER — Ambulatory Visit: Payer: Medicare Other | Admitting: Nurse Practitioner

## 2014-06-17 ENCOUNTER — Ambulatory Visit (INDEPENDENT_AMBULATORY_CARE_PROVIDER_SITE_OTHER): Payer: Medicare Other | Admitting: *Deleted

## 2014-06-17 ENCOUNTER — Ambulatory Visit (INDEPENDENT_AMBULATORY_CARE_PROVIDER_SITE_OTHER): Payer: Medicare Other

## 2014-06-17 DIAGNOSIS — R5383 Other fatigue: Secondary | ICD-10-CM

## 2014-06-17 DIAGNOSIS — I4891 Unspecified atrial fibrillation: Secondary | ICD-10-CM | POA: Diagnosis not present

## 2014-06-17 DIAGNOSIS — I482 Chronic atrial fibrillation, unspecified: Secondary | ICD-10-CM

## 2014-06-17 DIAGNOSIS — Z7901 Long term (current) use of anticoagulants: Secondary | ICD-10-CM

## 2014-06-17 DIAGNOSIS — Z5181 Encounter for therapeutic drug level monitoring: Secondary | ICD-10-CM

## 2014-06-17 LAB — CUP PACEART INCLINIC DEVICE CHECK
Battery Remaining Longevity: 63 mo
Battery Voltage: 2.77 V
Brady Statistic RV Percent Paced: 35 %
Date Time Interrogation Session: 20160525120337
Lead Channel Impedance Value: 0 Ohm
Lead Channel Impedance Value: 384 Ohm
Lead Channel Pacing Threshold Amplitude: 1.75 V
Lead Channel Pacing Threshold Pulse Width: 1 ms
Lead Channel Sensing Intrinsic Amplitude: 11.2 mV
Lead Channel Setting Pacing Amplitude: 3.5 V
Lead Channel Setting Pacing Pulse Width: 1 ms
Lead Channel Setting Sensing Sensitivity: 4 mV
MDC IDC MSMT BATTERY IMPEDANCE: 426 Ohm

## 2014-06-17 LAB — POCT INR: INR: 2.9

## 2014-06-17 NOTE — Progress Notes (Signed)
Pacemaker check in clinic- add on for fatigue. Normal device function. Threshold, sensing, impedance consistent with previous measurements. No high ventricular rates noted. Device programmed at appropriate safety margins. Histogram distribution appropriate for patient activity level. Patient wants programming changes made last month to be changed back- she reports heart racing and fatigue. Lower rate changed back to 60 from 50bpm per JA. Upon pt request BP taken- 144/82. Pt reports BP med changes last week per Dr. Jens Som-- recommended to keep taking and we will reevaluate in July. Estimated longevity 4-6 years after LR change. ROV with JA in July.

## 2014-06-19 ENCOUNTER — Ambulatory Visit: Payer: Medicare Other | Admitting: Cardiology

## 2014-06-24 ENCOUNTER — Encounter: Payer: Self-pay | Admitting: Internal Medicine

## 2014-06-26 ENCOUNTER — Encounter: Payer: Self-pay | Admitting: Internal Medicine

## 2014-07-15 ENCOUNTER — Ambulatory Visit (INDEPENDENT_AMBULATORY_CARE_PROVIDER_SITE_OTHER): Payer: Medicare Other | Admitting: *Deleted

## 2014-07-15 DIAGNOSIS — Z7901 Long term (current) use of anticoagulants: Secondary | ICD-10-CM | POA: Diagnosis not present

## 2014-07-15 DIAGNOSIS — I4891 Unspecified atrial fibrillation: Secondary | ICD-10-CM

## 2014-07-15 DIAGNOSIS — I482 Chronic atrial fibrillation, unspecified: Secondary | ICD-10-CM

## 2014-07-15 DIAGNOSIS — Z5181 Encounter for therapeutic drug level monitoring: Secondary | ICD-10-CM | POA: Diagnosis not present

## 2014-07-15 LAB — POCT INR: INR: 3.4

## 2014-08-05 ENCOUNTER — Ambulatory Visit (INDEPENDENT_AMBULATORY_CARE_PROVIDER_SITE_OTHER): Payer: Medicare Other | Admitting: *Deleted

## 2014-08-05 DIAGNOSIS — I482 Chronic atrial fibrillation, unspecified: Secondary | ICD-10-CM

## 2014-08-05 DIAGNOSIS — Z5181 Encounter for therapeutic drug level monitoring: Secondary | ICD-10-CM | POA: Diagnosis not present

## 2014-08-05 DIAGNOSIS — I4891 Unspecified atrial fibrillation: Secondary | ICD-10-CM | POA: Diagnosis not present

## 2014-08-05 DIAGNOSIS — Z7901 Long term (current) use of anticoagulants: Secondary | ICD-10-CM

## 2014-08-05 DIAGNOSIS — I495 Sick sinus syndrome: Secondary | ICD-10-CM | POA: Diagnosis not present

## 2014-08-05 LAB — CUP PACEART INCLINIC DEVICE CHECK
Battery Impedance: 475 Ohm
Brady Statistic RV Percent Paced: 78 %
Lead Channel Pacing Threshold Amplitude: 1.5 V
Lead Channel Sensing Intrinsic Amplitude: 11.2 mV
Lead Channel Setting Pacing Pulse Width: 1 ms
Lead Channel Setting Sensing Sensitivity: 4 mV
MDC IDC MSMT BATTERY REMAINING LONGEVITY: 43 mo
MDC IDC MSMT BATTERY VOLTAGE: 2.77 V
MDC IDC MSMT LEADCHNL RA IMPEDANCE VALUE: 0 Ohm
MDC IDC MSMT LEADCHNL RV IMPEDANCE VALUE: 413 Ohm
MDC IDC MSMT LEADCHNL RV PACING THRESHOLD PULSEWIDTH: 1 ms
MDC IDC SESS DTM: 20160713135340
MDC IDC SET LEADCHNL RV PACING AMPLITUDE: 3.5 V

## 2014-08-05 LAB — POCT INR: INR: 2.2

## 2014-08-05 NOTE — Progress Notes (Signed)
Pacemaker check in clinic. Normal device function. Threshold, sensing, impedance consistent with previous measurements. Device programmed to maximize longevity. No high ventricular rates noted. Device programmed at appropriate safety margins. Histogram distribution appropriate for patient activity level. Device programmed to optimize intrinsic conduction. Estimated longevity 2.5-4 years. ROV with device clinic 01/31/14.

## 2014-08-26 ENCOUNTER — Encounter: Payer: Self-pay | Admitting: Internal Medicine

## 2014-09-02 ENCOUNTER — Ambulatory Visit (INDEPENDENT_AMBULATORY_CARE_PROVIDER_SITE_OTHER): Payer: Medicare Other | Admitting: *Deleted

## 2014-09-02 DIAGNOSIS — Z7901 Long term (current) use of anticoagulants: Secondary | ICD-10-CM

## 2014-09-02 DIAGNOSIS — Z5181 Encounter for therapeutic drug level monitoring: Secondary | ICD-10-CM | POA: Diagnosis not present

## 2014-09-02 DIAGNOSIS — I4891 Unspecified atrial fibrillation: Secondary | ICD-10-CM | POA: Diagnosis not present

## 2014-09-02 DIAGNOSIS — I482 Chronic atrial fibrillation, unspecified: Secondary | ICD-10-CM

## 2014-09-02 LAB — POCT INR: INR: 2.6

## 2014-10-01 ENCOUNTER — Ambulatory Visit (INDEPENDENT_AMBULATORY_CARE_PROVIDER_SITE_OTHER): Payer: Medicare Other | Admitting: *Deleted

## 2014-10-01 DIAGNOSIS — Z5181 Encounter for therapeutic drug level monitoring: Secondary | ICD-10-CM | POA: Diagnosis not present

## 2014-10-01 DIAGNOSIS — I482 Chronic atrial fibrillation, unspecified: Secondary | ICD-10-CM

## 2014-10-01 DIAGNOSIS — I4891 Unspecified atrial fibrillation: Secondary | ICD-10-CM | POA: Diagnosis not present

## 2014-10-01 DIAGNOSIS — Z7901 Long term (current) use of anticoagulants: Secondary | ICD-10-CM

## 2014-10-01 LAB — POCT INR: INR: 2.6

## 2014-10-01 MED ORDER — WARFARIN SODIUM 3 MG PO TABS: ORAL_TABLET | ORAL | Status: DC

## 2014-10-27 ENCOUNTER — Other Ambulatory Visit: Payer: Self-pay | Admitting: Nurse Practitioner

## 2014-10-27 ENCOUNTER — Ambulatory Visit
Admission: RE | Admit: 2014-10-27 | Discharge: 2014-10-27 | Disposition: A | Discharge status: Home / Self Care | Payer: Medicare Other | Source: Ambulatory Visit | Attending: Nurse Practitioner | Admitting: Nurse Practitioner

## 2014-10-27 DIAGNOSIS — R0602 Shortness of breath: Secondary | ICD-10-CM

## 2014-11-11 ENCOUNTER — Ambulatory Visit (INDEPENDENT_AMBULATORY_CARE_PROVIDER_SITE_OTHER): Payer: Medicare Other | Admitting: *Deleted

## 2014-11-11 DIAGNOSIS — Z5181 Encounter for therapeutic drug level monitoring: Secondary | ICD-10-CM

## 2014-11-11 DIAGNOSIS — Z7901 Long term (current) use of anticoagulants: Secondary | ICD-10-CM | POA: Diagnosis not present

## 2014-11-11 DIAGNOSIS — I482 Chronic atrial fibrillation, unspecified: Secondary | ICD-10-CM

## 2014-11-11 DIAGNOSIS — I4891 Unspecified atrial fibrillation: Secondary | ICD-10-CM

## 2014-11-11 LAB — POCT INR: INR: 2.5

## 2014-11-25 ENCOUNTER — Other Ambulatory Visit: Payer: Self-pay | Admitting: *Deleted

## 2014-11-25 MED ORDER — WARFARIN SODIUM 3 MG PO TABS: ORAL_TABLET | ORAL | Status: DC

## 2014-11-25 NOTE — Telephone Encounter (Signed)
Spoke with pt and informed that refill of coumadin has been sent to [facility] and she states understanding

## 2014-12-16 ENCOUNTER — Encounter: Payer: Self-pay | Admitting: *Deleted
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
## 2014-12-23 ENCOUNTER — Ambulatory Visit (INDEPENDENT_AMBULATORY_CARE_PROVIDER_SITE_OTHER): Payer: Medicare Other | Admitting: Pharmacist Clinician (PhC)/ Clinical Pharmacy Specialist

## 2014-12-23 ENCOUNTER — Ambulatory Visit (INDEPENDENT_AMBULATORY_CARE_PROVIDER_SITE_OTHER): Payer: Medicare Other | Admitting: Cardiology

## 2014-12-23 ENCOUNTER — Encounter: Payer: Self-pay | Admitting: Cardiology

## 2014-12-23 VITALS — BP 106/64 | HR 81 | Ht 63.5 in | Wt 135.1 lb

## 2014-12-23 DIAGNOSIS — I482 Chronic atrial fibrillation: Secondary | ICD-10-CM

## 2014-12-23 DIAGNOSIS — I10 Essential (primary) hypertension: Secondary | ICD-10-CM

## 2014-12-23 DIAGNOSIS — Z7901 Long term (current) use of anticoagulants: Secondary | ICD-10-CM

## 2014-12-23 DIAGNOSIS — I4821 Permanent atrial fibrillation: Secondary | ICD-10-CM

## 2014-12-23 DIAGNOSIS — Z5181 Encounter for therapeutic drug level monitoring: Secondary | ICD-10-CM | POA: Diagnosis not present

## 2014-12-23 DIAGNOSIS — Z950 Presence of cardiac pacemaker: Secondary | ICD-10-CM | POA: Diagnosis not present

## 2014-12-23 DIAGNOSIS — N184 Chronic kidney disease, stage 4 (severe): Secondary | ICD-10-CM

## 2014-12-23 DIAGNOSIS — I4891 Unspecified atrial fibrillation: Secondary | ICD-10-CM | POA: Diagnosis not present

## 2014-12-23 DIAGNOSIS — I2510 Atherosclerotic heart disease of native coronary artery without angina pectoris: Secondary | ICD-10-CM

## 2014-12-23 LAB — POCT INR: INR: 2.2

## 2014-12-23 NOTE — Assessment & Plan Note (Signed)
Followed by nephrology. Continue present dose of Lasix.

## 2014-12-23 NOTE — Patient Instructions (Signed)
Your physician wants you to follow-up in: 6 MONTHS WITH DR CRENSHAW You will receive a reminder letter in the mail two months in advance. If you don't receive a letter, please call our office to schedule the follow-up appointment.  

## 2014-12-23 NOTE — Assessment & Plan Note (Signed)
No aspirin given the Coumadin.

## 2014-12-23 NOTE — Assessment & Plan Note (Signed)
Continue beta blocker and Coumadin. 

## 2014-12-23 NOTE — Assessment & Plan Note (Signed)
Followed by electrophysiology. 

## 2014-12-23 NOTE — Assessment & Plan Note (Signed)
Blood pressure controlled. Continue present medications. 

## 2015-01-06 ENCOUNTER — Telehealth: Payer: Self-pay | Admitting: Cardiology

## 2015-01-06 NOTE — Telephone Encounter (Signed)
Maia Plan is calling in to get clarity on the pt's INR orders

## 2015-01-06 NOTE — Telephone Encounter (Signed)
Tilica with [facility] needed clarification on coumadin changes made on 12/23/2014. Verified doses from last coumadin clinic visit. Tilica verbalized understanding.

## 2015-01-07 LAB — PROTIME-INR: INR: 2.8 — AB (ref <=1.1)

## 2015-01-08 ENCOUNTER — Ambulatory Visit (INDEPENDENT_AMBULATORY_CARE_PROVIDER_SITE_OTHER): Payer: Medicare Other | Admitting: Pharmacist Clinician (PhC)/ Clinical Pharmacy Specialist

## 2015-01-08 ENCOUNTER — Telehealth: Payer: Self-pay | Admitting: Cardiology

## 2015-01-08 DIAGNOSIS — I4821 Permanent atrial fibrillation: Secondary | ICD-10-CM

## 2015-01-08 DIAGNOSIS — Z5181 Encounter for therapeutic drug level monitoring: Secondary | ICD-10-CM

## 2015-01-08 DIAGNOSIS — I482 Chronic atrial fibrillation: Secondary | ICD-10-CM

## 2015-01-08 NOTE — Telephone Encounter (Signed)
Nurse at [facility] called in wanting to know which dosage to give the pt because the pt is due to have it drawn at 4pm today . She says this is urgent . Please f/u with her  Thanks

## 2015-01-08 NOTE — Telephone Encounter (Signed)
See anticoag note

## 2015-01-08 NOTE — Telephone Encounter (Signed)
°  New Prob   Pt was transferred to Assisted Living. PT-INR dose this morning. Nurse is calling to find out what dosing of Coumadin to give to pt. Please call.

## 2015-01-11 ENCOUNTER — Encounter: Payer: Medicare Other | Admitting: Pharmacist Clinician (PhC)/ Clinical Pharmacy Specialist

## 2015-01-11 ENCOUNTER — Telehealth: Payer: Self-pay | Admitting: Cardiology

## 2015-01-11 DIAGNOSIS — I4821 Permanent atrial fibrillation: Secondary | ICD-10-CM

## 2015-01-11 DIAGNOSIS — Z5181 Encounter for therapeutic drug level monitoring: Secondary | ICD-10-CM

## 2015-01-11 LAB — POCT INR: INR: 2.8

## 2015-01-11 NOTE — Progress Notes (Signed)
This encounter was created in error - please disregard.

## 2015-01-11 NOTE — Telephone Encounter (Signed)
°  New Prob  Calling to give PT-INR results: PT-29.9 INR- 2.8

## 2015-01-11 NOTE — Telephone Encounter (Signed)
Order given on Friday.  Found by RN after she called.

## 2015-01-28 ENCOUNTER — Telehealth: Payer: Self-pay | Admitting: Cardiology

## 2015-01-28 ENCOUNTER — Ambulatory Visit (INDEPENDENT_AMBULATORY_CARE_PROVIDER_SITE_OTHER): Payer: Medicare Other | Admitting: Cardiovascular Disease

## 2015-01-28 DIAGNOSIS — I482 Chronic atrial fibrillation: Secondary | ICD-10-CM

## 2015-01-28 DIAGNOSIS — Z5181 Encounter for therapeutic drug level monitoring: Secondary | ICD-10-CM

## 2015-01-28 DIAGNOSIS — I4821 Permanent atrial fibrillation: Secondary | ICD-10-CM

## 2015-01-28 LAB — PROTIME-INR: INR: 3 — AB (ref 0.9–1.1)

## 2015-01-28 NOTE — Telephone Encounter (Signed)
New message     Please fax INR orders to [phone].  Pt is now in assisted living

## 2015-01-28 NOTE — Telephone Encounter (Signed)
Orders faxed to [phone] as requested

## 2015-02-01 ENCOUNTER — Ambulatory Visit (INDEPENDENT_AMBULATORY_CARE_PROVIDER_SITE_OTHER): Payer: Medicare Other | Admitting: *Deleted

## 2015-02-01 DIAGNOSIS — I482 Chronic atrial fibrillation, unspecified: Secondary | ICD-10-CM

## 2015-02-01 DIAGNOSIS — R040 Epistaxis: Secondary | ICD-10-CM

## 2015-02-01 DIAGNOSIS — Z950 Presence of cardiac pacemaker: Secondary | ICD-10-CM

## 2015-02-01 LAB — CBC
HCT: 41 % (ref 36.0–46.0)
HEMOGLOBIN: 14 g/dL (ref 12.0–15.0)
MCH: 32.3 pg (ref 26.0–34.0)
MCHC: 34.1 g/dL (ref 30.0–36.0)
MCV: 94.5 fL (ref 78.0–100.0)
MPV: 10.4 fL (ref 8.6–12.4)
Platelets: 152 10*3/uL (ref 150–400)
RBC: 4.34 MIL/uL (ref 3.87–5.11)
RDW: 16.2 % — AB (ref 11.5–15.5)
WBC: 6.4 10*3/uL (ref 4.0–10.5)

## 2015-02-01 NOTE — Progress Notes (Signed)
Pacemaker check in clinic. Normal device function. Threshold, sensing, impedance consistent with previous measurements. Device programmed to maximize longevity. AF +warfarin. 1 high ventricular rate noted- 4 seconds, pk V 180bpm. Device programmed at appropriate safety margins. Histogram distribution appropriate for patient activity level. Device programmed to optimize intrinsic conduction. Estimated longevity 2-3.5 years. ROV with AS in April. CBC verbally ordered by Dr. Johney Frame due to pt c/o nose bleed x 3 this last week.

## 2015-02-10 ENCOUNTER — Encounter: Payer: Self-pay | Admitting: Internal Medicine

## 2015-02-11 ENCOUNTER — Ambulatory Visit (INDEPENDENT_AMBULATORY_CARE_PROVIDER_SITE_OTHER): Payer: Medicare Other | Admitting: Cardiovascular Disease

## 2015-02-11 DIAGNOSIS — Z5181 Encounter for therapeutic drug level monitoring: Secondary | ICD-10-CM

## 2015-02-11 DIAGNOSIS — I482 Chronic atrial fibrillation, unspecified: Secondary | ICD-10-CM

## 2015-02-11 LAB — PROTIME-INR: INR: 2.9 — AB (ref 0.9–1.1)

## 2015-03-02 ENCOUNTER — Emergency Department (HOSPITAL_COMMUNITY)
Admission: EM | Admit: 2015-03-02 | Discharge: 2015-03-02 | Disposition: A | Discharge status: Home / Self Care | Payer: Medicare Other | Attending: Emergency Medicine | Admitting: Emergency Medicine

## 2015-03-02 DIAGNOSIS — I2510 Atherosclerotic heart disease of native coronary artery without angina pectoris: Secondary | ICD-10-CM | POA: Diagnosis not present

## 2015-03-02 DIAGNOSIS — Z9861 Coronary angioplasty status: Secondary | ICD-10-CM | POA: Insufficient documentation

## 2015-03-02 DIAGNOSIS — R040 Epistaxis: Secondary | ICD-10-CM

## 2015-03-02 DIAGNOSIS — I4891 Unspecified atrial fibrillation: Secondary | ICD-10-CM | POA: Diagnosis not present

## 2015-03-02 DIAGNOSIS — M109 Gout, unspecified: Secondary | ICD-10-CM | POA: Insufficient documentation

## 2015-03-02 DIAGNOSIS — Z8659 Personal history of other mental and behavioral disorders: Secondary | ICD-10-CM | POA: Diagnosis not present

## 2015-03-02 DIAGNOSIS — H5442 Blindness, left eye, normal vision right eye: Secondary | ICD-10-CM | POA: Diagnosis not present

## 2015-03-02 DIAGNOSIS — K219 Gastro-esophageal reflux disease without esophagitis: Secondary | ICD-10-CM | POA: Insufficient documentation

## 2015-03-02 DIAGNOSIS — G43909 Migraine, unspecified, not intractable, without status migrainosus: Secondary | ICD-10-CM | POA: Insufficient documentation

## 2015-03-02 DIAGNOSIS — Z8639 Personal history of other endocrine, nutritional and metabolic disease: Secondary | ICD-10-CM | POA: Diagnosis not present

## 2015-03-02 DIAGNOSIS — Z862 Personal history of diseases of the blood and blood-forming organs and certain disorders involving the immune mechanism: Secondary | ICD-10-CM | POA: Diagnosis not present

## 2015-03-02 DIAGNOSIS — Z7901 Long term (current) use of anticoagulants: Secondary | ICD-10-CM | POA: Diagnosis not present

## 2015-03-02 DIAGNOSIS — I129 Hypertensive chronic kidney disease with stage 1 through stage 4 chronic kidney disease, or unspecified chronic kidney disease: Secondary | ICD-10-CM | POA: Insufficient documentation

## 2015-03-02 DIAGNOSIS — D6832 Hemorrhagic disorder due to extrinsic circulating anticoagulants: Secondary | ICD-10-CM

## 2015-03-02 DIAGNOSIS — Z950 Presence of cardiac pacemaker: Secondary | ICD-10-CM | POA: Diagnosis not present

## 2015-03-02 DIAGNOSIS — D689 Coagulation defect, unspecified: Secondary | ICD-10-CM | POA: Diagnosis not present

## 2015-03-02 DIAGNOSIS — N184 Chronic kidney disease, stage 4 (severe): Secondary | ICD-10-CM | POA: Insufficient documentation

## 2015-03-02 DIAGNOSIS — T45515A Adverse effect of anticoagulants, initial encounter: Secondary | ICD-10-CM

## 2015-03-02 LAB — HEMOGLOBIN AND HEMATOCRIT, BLOOD
HEMATOCRIT: 42 % (ref 36.0–46.0)
HEMOGLOBIN: 13.9 g/dL (ref 12.0–15.0)

## 2015-03-02 LAB — PROTIME-INR
INR: 3.06 — ABNORMAL HIGH (ref 0.00–1.49)
PROTHROMBIN TIME: 31 s — AB (ref 11.6–15.2)

## 2015-03-02 MED ORDER — OXYMETAZOLINE HCL 0.05 % NA SOLN
1.0000 | Freq: Once | NASAL | Status: AC
  Administered 2015-03-02: 1 via NASAL
  Filled 2015-03-02: qty 15

## 2015-03-02 MED ORDER — AMOXICILLIN 500 MG PO CAPS: 500.0000 mg | ORAL_CAPSULE | Freq: Two times a day (BID) | ORAL | Status: DC

## 2015-03-02 NOTE — ED Notes (Signed)
Pt arrives from assisted living at [facility] reporting new nosebleed, R nare.  EMS reports giving 2 squirts of Afrin, reports bleeding slowed but did not stop.  BP 174/97.  Pt applying pressure.
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
## 2015-03-02 NOTE — ED Notes (Signed)
Patient given phone to speak with son.

## 2015-03-02 NOTE — Discharge Instructions (Signed)
Schedule a follow-up appointment with [facility] ENT in 5 days to have your packing removed. Take your amoxicillin twice a day for 5 days.  Given recent/current bleeding, hold/stop your coumadin for now - contact your doctor who prescribes your coumadin tomorrow, and discuss when to re-start coumadin, and at what dose.   Use humidifier in your room to help keep air moist.   Return to ER if worse, persistent bleeding, trouble breathing, other concern.      Nosebleed Nosebleeds are common. They are due to a crack in the inside lining of your nose (mucous membrane) or from a small blood vessel that starts to bleed. Nosebleeds can be caused by many conditions, such as injury, infections, dry mucous membranes or dry climate, medicines, nose picking, and home heating and cooling systems. Most nosebleeds come from blood vessels in the front of your nose. HOME CARE INSTRUCTIONS   Try controlling your nosebleed by pinching your nostrils gently and continuously for at least 10 minutes.  Avoid blowing or sniffing your nose for a number of hours after having a nosebleed.  Do not put gauze inside your nose yourself. If your nose was packed by your health care provider, try to maintain the pack inside of your nose until your health care provider removes it.  If a gauze pack was used and it starts to fall out, gently replace it or cut off the end of it.  If a balloon catheter was used to pack your nose, do not cut or remove it unless your health care provider has instructed you to do that.  Avoid lying down while you are having a nosebleed. Sit up and lean forward.  Use a nasal spray decongestant to help with a nosebleed as directed by your health care provider.  Do not use petroleum jelly or mineral oil in your nose. These can drip into your lungs.  Maintain humidity in your home by using less air conditioning or by using a humidifier.  Aspirinand blood thinners make bleeding more likely. If you  are prescribed these medicines and you suffer from nosebleeds, ask your health care provider if you should stop taking the medicines or adjust the dose. Do not stop medicines unless directed by your health care provider  Resume your normal activities as you are able, but avoid straining, lifting, or bending at the waist for several days.  If your nosebleed was caused by dry mucous membranes, use over-the-counter saline nasal spray or gel. This will keep the mucous membranes moist and allow them to heal. If you must use a lubricant, choose the water-soluble variety. Use it only sparingly, and do not use it within several hours of lying down.  Keep all follow-up visits as directed by your health care provider. This is important. SEEK MEDICAL CARE IF:  You have a fever.  You get frequent nosebleeds.  You are getting nosebleeds more often. SEEK IMMEDIATE MEDICAL CARE IF:  Your nosebleed lasts longer than 20 minutes.  Your nosebleed occurs after an injury to your face, and your nose looks crooked or broken.  You have unusual bleeding from other parts of your body.  You have unusual bruising on other parts of your body.  You feel light-headed or you faint.  You become sweaty.  You vomit blood.  Your nosebleed occurs after a head injury.   This information is not intended to replace advice given to you by your health care provider. Make sure you discuss any questions you have with your  health care provider.   Document Released: 10/19/2004 Document Revised: 01/30/2014 Document Reviewed: 08/25/2013 Elsevier Interactive Patient Education [employer]

## 2015-03-02 NOTE — ED Notes (Signed)
Patient resting bleeding control
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
## 2015-03-02 NOTE — ED Notes (Signed)
Patient given ice water 

## 2015-03-02 NOTE — ED Provider Notes (Signed)
Patient seen by Dr Annalee Genta ENT who rec d/c to home w packing in place, po abx, and states they will follow up this coming Monday.  Recheck pt, no active bleeding, pt comfortable.  Pt currently appears stable for d/c.     Tracey Laine, MD 03/02/15 [phone]

## 2015-03-02 NOTE — ED Notes (Signed)
Report called to [facility] [phone]) advised patient to leave packing until Monday. Also advised to bring patient's robe. [facility] advised will send someone.

## 2015-03-02 NOTE — ED Notes (Signed)
Pt stated that the ED needed to call [facility] to come and pick her up when she is discharged. She requested that whoever calls would request them to bring her white and turquoise robe to the ER when they come and pick her up. It is located in her bathroom under her royal blue robe.

## 2015-03-02 NOTE — ED Notes (Signed)
Patient still actively bleeding MD made aware.

## 2015-03-02 NOTE — ED Notes (Signed)
ENT at bedside,

## 2015-03-02 NOTE — ED Notes (Signed)
PA at bedside.

## 2015-03-02 NOTE — ED Notes (Signed)
Patient still actively bleeding, MD made aware. States consult to ENT

## 2015-03-02 NOTE — ED Notes (Signed)
Patient resting.

## 2015-03-03 ENCOUNTER — Telehealth: Payer: Self-pay | Admitting: *Deleted

## 2015-03-03 NOTE — Telephone Encounter (Signed)
Spoke with Tilichia at [facility] and she states pt was seen in ER yesterday for epistaxis and has packing in nose until rechecked  by ENT on Monday and at ER yesterday she was instructed to hold coumadin Her INR at ER was 3.06  Tilichia instructed to restart her coumadin today  as directed on last coumadin encounter and to check INR tomorrow as ordered and Tilichia states understanding Pt is also on Amoxicillin and Tilichia instructed there is no interaction between Amoxicillin and coumadin

## 2015-03-04 ENCOUNTER — Ambulatory Visit (INDEPENDENT_AMBULATORY_CARE_PROVIDER_SITE_OTHER): Payer: Medicare Other | Admitting: Internal Medicine

## 2015-03-04 DIAGNOSIS — I482 Chronic atrial fibrillation, unspecified: Secondary | ICD-10-CM

## 2015-03-04 DIAGNOSIS — Z5181 Encounter for therapeutic drug level monitoring: Secondary | ICD-10-CM

## 2015-03-04 LAB — PROTIME-INR: INR: 2.9 — AB (ref 0.9–1.1)

## 2015-03-11 ENCOUNTER — Ambulatory Visit (INDEPENDENT_AMBULATORY_CARE_PROVIDER_SITE_OTHER): Payer: Medicare Other | Admitting: Cardiovascular Disease

## 2015-03-11 DIAGNOSIS — Z5181 Encounter for therapeutic drug level monitoring: Secondary | ICD-10-CM

## 2015-03-11 DIAGNOSIS — I482 Chronic atrial fibrillation, unspecified: Secondary | ICD-10-CM

## 2015-03-11 LAB — PROTIME-INR: INR: 3 — AB (ref 0.9–1.1)

## 2015-03-25 ENCOUNTER — Ambulatory Visit (INDEPENDENT_AMBULATORY_CARE_PROVIDER_SITE_OTHER): Payer: Medicare Other | Admitting: Pharmacist

## 2015-03-25 DIAGNOSIS — Z5181 Encounter for therapeutic drug level monitoring: Secondary | ICD-10-CM

## 2015-03-25 DIAGNOSIS — I482 Chronic atrial fibrillation, unspecified: Secondary | ICD-10-CM

## 2015-03-25 LAB — PROTIME-INR: INR: 2.5 — AB (ref 0.9–1.1)

## 2015-03-29 ENCOUNTER — Emergency Department (HOSPITAL_COMMUNITY)
Admission: EM | Admit: 2015-03-29 | Discharge: 2015-03-29 | Disposition: A | Discharge status: Home / Self Care | Payer: Medicare Other | Attending: Emergency Medicine | Admitting: Emergency Medicine

## 2015-03-29 ENCOUNTER — Encounter (HOSPITAL_COMMUNITY): Payer: Self-pay | Admitting: Emergency Medicine

## 2015-03-29 DIAGNOSIS — M1990 Unspecified osteoarthritis, unspecified site: Secondary | ICD-10-CM | POA: Diagnosis not present

## 2015-03-29 DIAGNOSIS — I482 Chronic atrial fibrillation: Secondary | ICD-10-CM | POA: Diagnosis not present

## 2015-03-29 DIAGNOSIS — Z950 Presence of cardiac pacemaker: Secondary | ICD-10-CM | POA: Insufficient documentation

## 2015-03-29 DIAGNOSIS — G43909 Migraine, unspecified, not intractable, without status migrainosus: Secondary | ICD-10-CM | POA: Insufficient documentation

## 2015-03-29 DIAGNOSIS — N184 Chronic kidney disease, stage 4 (severe): Secondary | ICD-10-CM | POA: Insufficient documentation

## 2015-03-29 DIAGNOSIS — F419 Anxiety disorder, unspecified: Secondary | ICD-10-CM | POA: Diagnosis not present

## 2015-03-29 DIAGNOSIS — Z862 Personal history of diseases of the blood and blood-forming organs and certain disorders involving the immune mechanism: Secondary | ICD-10-CM | POA: Diagnosis not present

## 2015-03-29 DIAGNOSIS — I2510 Atherosclerotic heart disease of native coronary artery without angina pectoris: Secondary | ICD-10-CM | POA: Diagnosis not present

## 2015-03-29 DIAGNOSIS — H5442 Blindness, left eye, normal vision right eye: Secondary | ICD-10-CM | POA: Diagnosis not present

## 2015-03-29 DIAGNOSIS — Z792 Long term (current) use of antibiotics: Secondary | ICD-10-CM | POA: Insufficient documentation

## 2015-03-29 DIAGNOSIS — Z7901 Long term (current) use of anticoagulants: Secondary | ICD-10-CM | POA: Diagnosis not present

## 2015-03-29 DIAGNOSIS — Z8639 Personal history of other endocrine, nutritional and metabolic disease: Secondary | ICD-10-CM | POA: Insufficient documentation

## 2015-03-29 DIAGNOSIS — Z9861 Coronary angioplasty status: Secondary | ICD-10-CM | POA: Insufficient documentation

## 2015-03-29 DIAGNOSIS — M109 Gout, unspecified: Secondary | ICD-10-CM | POA: Insufficient documentation

## 2015-03-29 DIAGNOSIS — R040 Epistaxis: Secondary | ICD-10-CM | POA: Diagnosis present

## 2015-03-29 DIAGNOSIS — Z79899 Other long term (current) drug therapy: Secondary | ICD-10-CM | POA: Diagnosis not present

## 2015-03-29 DIAGNOSIS — I129 Hypertensive chronic kidney disease with stage 1 through stage 4 chronic kidney disease, or unspecified chronic kidney disease: Secondary | ICD-10-CM | POA: Insufficient documentation

## 2015-03-29 DIAGNOSIS — K219 Gastro-esophageal reflux disease without esophagitis: Secondary | ICD-10-CM | POA: Insufficient documentation

## 2015-03-29 LAB — CBC
HEMATOCRIT: 39.5 % (ref 36.0–46.0)
HEMOGLOBIN: 13 g/dL (ref 12.0–15.0)
MCH: 32.9 pg (ref 26.0–34.0)
MCHC: 32.9 g/dL (ref 30.0–36.0)
MCV: 100 fL (ref 78.0–100.0)
Platelets: 159 10*3/uL (ref 150–400)
RBC: 3.95 MIL/uL (ref 3.87–5.11)
RDW: 16.6 % — AB (ref 11.5–15.5)
WBC: 5.7 10*3/uL (ref 4.0–10.5)

## 2015-03-29 LAB — BASIC METABOLIC PANEL
Anion gap: 13 (ref 5–15)
BUN: 34 mg/dL — AB (ref 6–20)
CALCIUM: 9.8 mg/dL (ref 8.9–10.3)
CO2: 24 mmol/L (ref 22–32)
CREATININE: 2.07 mg/dL — AB (ref 0.44–1.00)
Chloride: 102 mmol/L (ref 101–111)
GFR calc Af Amer: 23 mL/min — ABNORMAL LOW (ref >60)
GFR, EST NON AFRICAN AMERICAN: 20 mL/min — AB (ref >60)
Glucose, Bld: 123 mg/dL — ABNORMAL HIGH (ref 65–99)
POTASSIUM: 4 mmol/L (ref 3.5–5.1)
SODIUM: 139 mmol/L (ref 135–145)

## 2015-03-29 LAB — PROTIME-INR
INR: 2.24 — AB (ref 0.00–1.49)
Prothrombin Time: 24.5 seconds — ABNORMAL HIGH (ref 11.6–15.2)

## 2015-03-29 MED ORDER — OXYMETAZOLINE HCL 0.05 % NA SOLN
1.0000 | Freq: Once | NASAL | Status: AC
  Administered 2015-03-29: 1 via NASAL
  Filled 2015-03-29: qty 15

## 2015-03-29 MED ORDER — LIDOCAINE VISCOUS 2 % MT SOLN
15.0000 mL | Freq: Once | OROMUCOSAL | Status: AC
Start: 2015-03-29 — End: 2015-03-29
  Administered 2015-03-29: 15 mL via OROMUCOSAL
  Filled 2015-03-29: qty 15

## 2015-03-29 MED ORDER — OXYMETAZOLINE HCL 0.05 % NA SOLN
3.0000 | Freq: Once | NASAL | Status: AC
  Administered 2015-03-29: 3 via NASAL
  Filled 2015-03-29: qty 15

## 2015-03-29 MED ORDER — CEPHALEXIN 500 MG PO CAPS: 500.0000 mg | ORAL_CAPSULE | Freq: Three times a day (TID) | ORAL | Status: DC

## 2015-03-29 MED ORDER — LIDOCAINE HCL 4 % EX SOLN
Freq: Once | CUTANEOUS | Status: AC
  Administered 2015-03-29: 1 mL via TOPICAL
  Filled 2015-03-29: qty 50

## 2015-03-29 MED ORDER — LIDOCAINE VISCOUS 2 % MT SOLN
15.0000 mL | Freq: Once | OROMUCOSAL | Status: AC
  Administered 2015-03-29: 15 mL via OROMUCOSAL
  Filled 2015-03-29: qty 15

## 2015-03-29 NOTE — ED Notes (Signed)
ENT at bedside

## 2015-03-29 NOTE — ED Notes (Signed)
ENT finished with pt. Pt has packing to R nare. Pt sitting up in bed sipping water and has no complaints at this time.

## 2015-03-29 NOTE — ED Notes (Signed)
Stayed with patient until the Md was able to pack her nose. I held pressure but she still had free bleeding out of the right side of her nose.

## 2015-03-29 NOTE — ED Notes (Signed)
Pt arrives from assisted living at [facility] via [facility] c/o epistaxis.  EMS reports bleeding began spontaneously while pt was chewing ice.  R nare packed with a tampon, bleeding continues from both nares.  Pt applying pressure at bridge of nose.

## 2015-03-29 NOTE — Discharge Instructions (Signed)
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
## 2015-04-22 ENCOUNTER — Ambulatory Visit (INDEPENDENT_AMBULATORY_CARE_PROVIDER_SITE_OTHER): Payer: Medicare Other | Admitting: Cardiovascular Disease

## 2015-04-22 DIAGNOSIS — I482 Chronic atrial fibrillation, unspecified: Secondary | ICD-10-CM

## 2015-04-22 DIAGNOSIS — Z5181 Encounter for therapeutic drug level monitoring: Secondary | ICD-10-CM

## 2015-04-22 LAB — PROTIME-INR: INR: 2.5 — AB (ref 0.9–1.1)

## 2015-05-19 ENCOUNTER — Telehealth: Payer: Self-pay | Admitting: Cardiology

## 2015-05-19 ENCOUNTER — Ambulatory Visit: Payer: Self-pay | Admitting: Internal Medicine

## 2015-05-19 NOTE — Telephone Encounter (Signed)
Left message for tilica to call back

## 2015-05-19 NOTE — Telephone Encounter (Signed)
SPOKE THE NURSING STAFF AT [facility]   patient's Furosemide was increased to 160 mg twice a day on 05/13/15 by primary Dr Valentina Lucks Per Maia Plan, improved for a few days , but still short of breathe on exertion and orthopnea No swelling noted  Patient has is not a daily weight and last weight was 04/29/15 - 139.4 lbs Occasional cough  Oxygen sat 94% today; b/p 163/78 RN asked if this information relayed back to Dr Valentina Lucks Per Maia Plan, patient did not want see Dr Valentina Lucks again, and wanted the facility to contact Dr Jens Som  Aware will defer to Dr Jens Som and contact with answer

## 2015-05-19 NOTE — Telephone Encounter (Signed)
New message   [facility] calling    Patient c/o sob   Pt c/o Shortness Of Breath: STAT if SOB developed within the last 24 hours or pt is noticeably SOB on the phone  1. Are you currently SOB (can you hear that pt is SOB on the phone)? [facility] calling   2. How long have you been experiencing SOB? A few weeks. PCP was seen Lasix was increase -   3. Are you SOB when sitting or when up moving around? On exertion  4. Are you currently experiencing any other symptoms? No

## 2015-05-19 NOTE — Telephone Encounter (Signed)
paov Tracey [name]  

## 2015-05-20 NOTE — Telephone Encounter (Signed)
Follow up scheduled

## 2015-05-20 NOTE — Telephone Encounter (Addendum)
Tracey [name] was calling back to speak with [name].

## 2015-05-21 ENCOUNTER — Ambulatory Visit (INDEPENDENT_AMBULATORY_CARE_PROVIDER_SITE_OTHER): Payer: Medicare Other | Admitting: Internal Medicine

## 2015-05-21 DIAGNOSIS — Z5181 Encounter for therapeutic drug level monitoring: Secondary | ICD-10-CM

## 2015-05-21 DIAGNOSIS — I482 Chronic atrial fibrillation, unspecified: Secondary | ICD-10-CM

## 2015-05-21 LAB — PROTIME-INR: INR: 2.4 — AB (ref 0.9–1.1)

## 2015-05-25 ENCOUNTER — Encounter: Payer: Self-pay | Admitting: Nurse Practitioner

## 2015-05-25 ENCOUNTER — Ambulatory Visit (INDEPENDENT_AMBULATORY_CARE_PROVIDER_SITE_OTHER): Payer: Medicare Other | Admitting: Nurse Practitioner

## 2015-05-25 VITALS — BP 150/84 | HR 64 | Ht 63.5 in | Wt 138.8 lb

## 2015-05-25 DIAGNOSIS — I5032 Chronic diastolic (congestive) heart failure: Secondary | ICD-10-CM

## 2015-05-25 DIAGNOSIS — I5033 Acute on chronic diastolic (congestive) heart failure: Secondary | ICD-10-CM | POA: Insufficient documentation

## 2015-05-25 DIAGNOSIS — R0602 Shortness of breath: Secondary | ICD-10-CM | POA: Diagnosis not present

## 2015-05-25 DIAGNOSIS — I2510 Atherosclerotic heart disease of native coronary artery without angina pectoris: Secondary | ICD-10-CM | POA: Diagnosis not present

## 2015-05-25 DIAGNOSIS — I495 Sick sinus syndrome: Secondary | ICD-10-CM | POA: Insufficient documentation

## 2015-05-25 DIAGNOSIS — I4821 Permanent atrial fibrillation: Secondary | ICD-10-CM | POA: Insufficient documentation

## 2015-05-25 DIAGNOSIS — I482 Chronic atrial fibrillation: Secondary | ICD-10-CM

## 2015-05-25 DIAGNOSIS — N184 Chronic kidney disease, stage 4 (severe): Secondary | ICD-10-CM | POA: Diagnosis not present

## 2015-05-25 DIAGNOSIS — R040 Epistaxis: Secondary | ICD-10-CM
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
## 2015-05-25 NOTE — Patient Instructions (Signed)
Your physician has requested that you have an echocardiogram @ [address] - [address]rd Floor. Echocardiography is a painless test that uses sound waves to create images of your heart. It provides your doctor with information about the size and shape of your heart and how well your heart's chambers and valves are working. This procedure takes approximately one hour. There are no restrictions for this procedure.  Please keep scheduled follow up with Dr. Jens Som.

## 2015-05-28 ENCOUNTER — Ambulatory Visit (INDEPENDENT_AMBULATORY_CARE_PROVIDER_SITE_OTHER): Payer: Medicare Other | Admitting: Nurse Practitioner

## 2015-05-28 ENCOUNTER — Encounter: Payer: Self-pay | Admitting: Nurse Practitioner

## 2015-05-28 ENCOUNTER — Encounter: Payer: Self-pay | Admitting: Internal Medicine

## 2015-05-28 VITALS — BP 126/76 | HR 80 | Ht 63.5 in | Wt 137.8 lb

## 2015-05-28 DIAGNOSIS — I5032 Chronic diastolic (congestive) heart failure: Secondary | ICD-10-CM

## 2015-05-28 DIAGNOSIS — I4821 Permanent atrial fibrillation: Secondary | ICD-10-CM

## 2015-05-28 DIAGNOSIS — I495 Sick sinus syndrome: Secondary | ICD-10-CM | POA: Diagnosis not present

## 2015-05-28 DIAGNOSIS — I482 Chronic atrial fibrillation: Secondary | ICD-10-CM | POA: Diagnosis not present

## 2015-05-28 NOTE — Patient Instructions (Addendum)
Medication Instructions:   Your physician recommends that you continue on your current medications as directed. Please refer to the Current Medication list given to you today.   If you need a refill on your cardiac medications before your next appointment, please call your pharmacy.  Labwork: NONE ORDER TODAY    Testing/Procedures:  NONE ORDER TODAY    Follow-Up:  6 MONTHS WITH DEVICE CLINIC  A REMINDER LETTER WILL BE SENT TO REMIND TO CALL CLINIC AND MAKE APPT   Your physician wants you to follow-up in:  IN  6  MONTHS WITH DR Johney Frame   You will receive a reminder letter in the mail two months in advance. If you don't receive a letter, please call our office to schedule the follow-up appointment.      Any Other Special Instructions Will Be Listed Below (If Applicable).
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
## 2015-06-09 ENCOUNTER — Other Ambulatory Visit (HOSPITAL_COMMUNITY): Payer: Medicare Other

## 2015-06-15 ENCOUNTER — Other Ambulatory Visit: Payer: Self-pay

## 2015-06-15 ENCOUNTER — Ambulatory Visit (HOSPITAL_COMMUNITY): Payer: Medicare Other | Attending: Cardiology

## 2015-06-15 DIAGNOSIS — I5032 Chronic diastolic (congestive) heart failure: Secondary | ICD-10-CM | POA: Insufficient documentation

## 2015-06-15 DIAGNOSIS — I272 Other secondary pulmonary hypertension: Secondary | ICD-10-CM | POA: Insufficient documentation

## 2015-06-16 ENCOUNTER — Telehealth: Payer: Self-pay | Admitting: Cardiology

## 2015-06-16 NOTE — Telephone Encounter (Signed)
Returned call to Cerise at [facility] where the pt is. They are requesting a copy of her echo be faxed over. Echo results routed to [facility] at [phone].

## 2015-06-16 NOTE — Telephone Encounter (Signed)
New message   Cerise is calling for the Echo results please call

## 2015-06-18 LAB — PROTIME-INR: INR: 3.4 — AB (ref 0.9–1.1)

## 2015-06-22 ENCOUNTER — Ambulatory Visit (INDEPENDENT_AMBULATORY_CARE_PROVIDER_SITE_OTHER): Payer: Medicare Other | Admitting: Cardiology

## 2015-06-22 ENCOUNTER — Encounter: Payer: Self-pay | Admitting: Internal Medicine

## 2015-06-22 DIAGNOSIS — Z5181 Encounter for therapeutic drug level monitoring: Secondary | ICD-10-CM

## 2015-06-22 DIAGNOSIS — I482 Chronic atrial fibrillation, unspecified: Secondary | ICD-10-CM

## 2015-06-23 ENCOUNTER — Ambulatory Visit: Payer: Medicare Other | Admitting: Internal Medicine

## 2015-06-23 ENCOUNTER — Encounter: Payer: Self-pay | Admitting: Internal Medicine

## 2015-06-23 VITALS — BP 120/60 | HR 71 | Temp 97.5°F | Ht 64.0 in | Wt 137.0 lb

## 2015-06-23 DIAGNOSIS — I482 Chronic atrial fibrillation: Secondary | ICD-10-CM | POA: Diagnosis not present

## 2015-06-23 DIAGNOSIS — I4821 Permanent atrial fibrillation: Secondary | ICD-10-CM

## 2015-06-23 DIAGNOSIS — E0590 Thyrotoxicosis, unspecified without thyrotoxic crisis or storm: Secondary | ICD-10-CM | POA: Diagnosis not present

## 2015-06-23 DIAGNOSIS — M1009 Idiopathic gout, multiple sites: Secondary | ICD-10-CM

## 2015-06-23 DIAGNOSIS — Z7901 Long term (current) use of anticoagulants: Secondary | ICD-10-CM | POA: Diagnosis not present

## 2015-06-23 DIAGNOSIS — I2510 Atherosclerotic heart disease of native coronary artery without angina pectoris: Secondary | ICD-10-CM

## 2015-06-23 DIAGNOSIS — R040 Epistaxis: Secondary | ICD-10-CM | POA: Diagnosis not present

## 2015-06-23 DIAGNOSIS — M109 Gout, unspecified: Secondary | ICD-10-CM

## 2015-06-23 DIAGNOSIS — E049 Nontoxic goiter, unspecified: Secondary | ICD-10-CM | POA: Diagnosis not present

## 2015-06-23 DIAGNOSIS — K582 Mixed irritable bowel syndrome: Secondary | ICD-10-CM

## 2015-06-23 DIAGNOSIS — E785 Hyperlipidemia, unspecified: Secondary | ICD-10-CM

## 2015-06-23 DIAGNOSIS — K219 Gastro-esophageal reflux disease without esophagitis: Secondary | ICD-10-CM
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
## 2015-06-24 ENCOUNTER — Ambulatory Visit: Payer: Self-pay | Admitting: Internal Medicine

## 2015-06-30 ENCOUNTER — Ambulatory Visit: Payer: Self-pay | Admitting: Internal Medicine

## 2015-07-09 LAB — PROTIME-INR: INR: 2.8 — AB (ref 0.9–1.1)

## 2015-07-12 ENCOUNTER — Ambulatory Visit (INDEPENDENT_AMBULATORY_CARE_PROVIDER_SITE_OTHER): Payer: Medicare Other | Admitting: Cardiovascular Disease

## 2015-07-12 DIAGNOSIS — I482 Chronic atrial fibrillation, unspecified: Secondary | ICD-10-CM

## 2015-07-12 DIAGNOSIS — Z5181 Encounter for therapeutic drug level monitoring: Secondary | ICD-10-CM
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
## 2015-07-20 ENCOUNTER — Ambulatory Visit (INDEPENDENT_AMBULATORY_CARE_PROVIDER_SITE_OTHER): Payer: Medicare Other | Admitting: Cardiology

## 2015-07-20 ENCOUNTER — Encounter: Payer: Self-pay | Admitting: Cardiology

## 2015-07-20 VITALS — BP 126/80 | HR 74 | Ht 63.0 in | Wt 138.0 lb

## 2015-07-20 DIAGNOSIS — I4891 Unspecified atrial fibrillation: Secondary | ICD-10-CM

## 2015-07-20 DIAGNOSIS — I10 Essential (primary) hypertension: Secondary | ICD-10-CM

## 2015-07-20 DIAGNOSIS — N184 Chronic kidney disease, stage 4 (severe): Secondary | ICD-10-CM

## 2015-07-20 DIAGNOSIS — Z950 Presence of cardiac pacemaker: Secondary | ICD-10-CM

## 2015-07-20 DIAGNOSIS — I482 Chronic atrial fibrillation: Secondary | ICD-10-CM

## 2015-07-20 DIAGNOSIS — I5032 Chronic diastolic (congestive) heart failure: Secondary | ICD-10-CM

## 2015-07-20 DIAGNOSIS — I4821 Permanent atrial fibrillation: Secondary | ICD-10-CM

## 2015-07-20 DIAGNOSIS — I2510 Atherosclerotic heart disease of native coronary artery without angina pectoris: Secondary | ICD-10-CM

## 2015-07-20 NOTE — Assessment & Plan Note (Signed)
Check K and renal function.

## 2015-07-20 NOTE — Assessment & Plan Note (Signed)
Volume status much improved; continue present dose of diuretics; check K and renal function.

## 2015-07-20 NOTE — Assessment & Plan Note (Signed)
Followed by EP 

## 2015-07-20 NOTE — Patient Instructions (Signed)
Labwork:  Your physician recommends that you HAVE LAB WORK WHEN ABLE  Follow-Up:  Your physician wants you to follow-up in: 6 MONTHS WITH DR Jens Som You will receive a reminder letter in the mail two months in advance. If you don't receive a letter, please call our office to schedule the follow-up appointment.

## 2015-07-20 NOTE — Assessment & Plan Note (Signed)
Continue beta blocker and coumadin.

## 2015-07-20 NOTE — Assessment & Plan Note (Signed)
Not on ASA given need for coumadin.

## 2015-07-20 NOTE — Assessment & Plan Note (Signed)
BP controlled; continue present meds. 

## 2015-07-22 LAB — BASIC METABOLIC PANEL
BUN: 29 mg/dL — AB (ref 4–21)
Creatinine: 1.8 mg/dL — AB (ref 0.5–1.1)
Glucose: 88 mg/dL
Potassium: 4 mmol/L (ref 3.4–5.3)
SODIUM: 141 mmol/L (ref 137–147)

## 2015-07-23 ENCOUNTER — Encounter: Payer: Self-pay | Admitting: Cardiology

## 2015-08-05 ENCOUNTER — Non-Acute Institutional Stay: Payer: Medicare Other | Admitting: Adult Health

## 2015-08-05 ENCOUNTER — Encounter: Payer: Self-pay | Admitting: Adult Health

## 2015-08-05 DIAGNOSIS — J309 Allergic rhinitis, unspecified: Secondary | ICD-10-CM

## 2015-08-05 DIAGNOSIS — I5032 Chronic diastolic (congestive) heart failure: Secondary | ICD-10-CM | POA: Diagnosis not present
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
## 2015-08-08 ENCOUNTER — Encounter: Payer: Self-pay | Admitting: Pharmacist

## 2015-08-08 LAB — PROTIME-INR: INR: 2.2 — AB (ref 0.9–1.1)

## 2015-08-09 ENCOUNTER — Telehealth: Payer: Self-pay | Admitting: *Deleted

## 2015-08-09 NOTE — Telephone Encounter (Signed)
Spoke with Cerise at [facility] asking if INR had been done that was ordered on July 14th She states she was ordered Levaquin on Friday July 14th and was ordered INR to be checked on Sunday July 16th Results of INR was 2.15 and pt was instructed per Dr Sander Radon to continue same dose and ordered to have INR rechecked on July 18th and this nurse requested that INR results be sent to coumadin clinic and Cerise verbalized understanding. Marland Kitchen

## 2015-08-10 ENCOUNTER — Encounter: Payer: Self-pay | Admitting: Internal Medicine

## 2015-08-10 LAB — BASIC METABOLIC PANEL
BUN: 47 mg/dL — AB (ref 4–21)
Creatinine: 2.8 mg/dL — AB (ref 0.5–1.1)
Glucose: 103 mg/dL
Potassium: 3.6 mmol/L (ref 3.4–5.3)
Sodium: 138 mmol/L (ref 137–147)

## 2015-08-10 LAB — POCT INR: INR: 2.6 — AB (ref 0.9–1.1)

## 2015-08-11 ENCOUNTER — Telehealth: Payer: Self-pay | Admitting: *Deleted

## 2015-08-11 ENCOUNTER — Encounter: Payer: Self-pay | Admitting: Internal Medicine

## 2015-08-11 ENCOUNTER — Non-Acute Institutional Stay: Payer: Medicare Other | Admitting: Internal Medicine

## 2015-08-11 VITALS — BP 128/72 | HR 65 | Temp 97.4°F | Resp 18 | Wt 137.0 lb

## 2015-08-11 DIAGNOSIS — R0981 Nasal congestion: Secondary | ICD-10-CM | POA: Diagnosis not present

## 2015-08-11 DIAGNOSIS — Z7189 Other specified counseling: Secondary | ICD-10-CM | POA: Diagnosis not present

## 2015-08-11 DIAGNOSIS — N179 Acute kidney failure, unspecified: Secondary | ICD-10-CM | POA: Diagnosis not present

## 2015-08-11 DIAGNOSIS — J189 Pneumonia, unspecified organism: Secondary | ICD-10-CM | POA: Diagnosis not present

## 2015-08-11 DIAGNOSIS — I5033 Acute on chronic diastolic (congestive) heart failure: Secondary | ICD-10-CM

## 2015-08-11 DIAGNOSIS — K582 Mixed irritable bowel syndrome: Secondary | ICD-10-CM | POA: Diagnosis not present

## 2015-08-11 DIAGNOSIS — N189 Chronic kidney disease, unspecified: Secondary | ICD-10-CM | POA: Diagnosis not present

## 2015-08-11 MED ORDER — FLUTICASONE PROPIONATE 50 MCG/ACT NA SUSP: 2.0000 | Freq: Every day | NASAL | Status: DC | PRN

## 2015-08-11 MED ORDER — SACCHAROMYCES BOULARDII 250 MG PO CAPS: 250.0000 mg | ORAL_CAPSULE | Freq: Two times a day (BID) | ORAL | Status: DC

## 2015-08-11 NOTE — Telephone Encounter (Signed)
Pt is overdue for an INR check therefore after looking in Epic, found out the pt was located in the Skilled Nursing Unit at [facility]. Spoke with [facility] Okey Regal & she stated Dr. Renato Gails was managing the pt's Coumadin while in Skilled Nursing (previously in Assisted Living).  There was no date planned for discharge at this time.  Informed Okey Regal that if the pt gets discharged from Skilled Nursing to notify us so we can continue to manage as we have done previously & she verbalized understanding.
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
## 2015-08-11 NOTE — Patient Instructions (Signed)
Repeat bmp and xray early next week Cont florastor Use flonase as needed Drink plenty of water

## 2015-09-21 LAB — BASIC METABOLIC PANEL
BUN: 36 mg/dL — AB (ref 4–21)
Creatinine: 2 mg/dL — AB (ref 0.5–1.1)
Glucose: 79 mg/dL
Potassium: 3.8 mmol/L (ref 3.4–5.3)
Sodium: 138 mmol/L (ref 137–147)

## 2015-09-21 LAB — LIPID PANEL
Cholesterol: 124 mg/dL (ref 0–200)
HDL: 25 mg/dL — AB (ref 35–70)
LDL Cholesterol: 55 mg/dL
Triglycerides: 217 mg/dL — AB (ref 40–160)

## 2015-09-21 LAB — HEPATIC FUNCTION PANEL
ALT: 11 U/L (ref 7–35)
AST: 17 U/L (ref 13–35)
Alkaline Phosphatase: 75 U/L (ref 25–125)
Bilirubin, Total: 0.8 mg/dL

## 2015-09-21 LAB — CBC AND DIFFERENTIAL
HCT: 40 % (ref 36–46)
Hemoglobin: 12.2 g/dL (ref 12.0–16.0)
Platelets: 107 10*3/uL — AB (ref 150–399)
WBC: 4.6 10^3/mL

## 2015-09-22 ENCOUNTER — Encounter: Payer: Self-pay | Admitting: Internal Medicine

## 2015-09-22 ENCOUNTER — Non-Acute Institutional Stay: Payer: Medicare Other | Admitting: Internal Medicine

## 2015-09-22 VITALS — BP 120/80 | HR 72 | Temp 97.5°F | Wt 142.0 lb

## 2015-09-22 DIAGNOSIS — Z5181 Encounter for therapeutic drug level monitoring: Secondary | ICD-10-CM

## 2015-09-22 DIAGNOSIS — I482 Chronic atrial fibrillation: Secondary | ICD-10-CM

## 2015-09-22 DIAGNOSIS — I4821 Permanent atrial fibrillation: Secondary | ICD-10-CM

## 2015-09-22 DIAGNOSIS — I5033 Acute on chronic diastolic (congestive) heart failure: Secondary | ICD-10-CM

## 2015-09-22 DIAGNOSIS — J189 Pneumonia, unspecified organism: Secondary | ICD-10-CM

## 2015-09-22 DIAGNOSIS — K582 Mixed irritable bowel syndrome: Secondary | ICD-10-CM

## 2015-09-22 LAB — POCT INR: INR: 2.1
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
## 2015-10-27 ENCOUNTER — Non-Acute Institutional Stay: Payer: Medicare Other | Admitting: Internal Medicine

## 2015-10-27 ENCOUNTER — Encounter: Payer: Self-pay | Admitting: Internal Medicine

## 2015-10-27 VITALS — BP 130/70 | HR 69 | Temp 98.0°F | Wt 140.0 lb

## 2015-10-27 DIAGNOSIS — I482 Chronic atrial fibrillation: Secondary | ICD-10-CM | POA: Diagnosis not present

## 2015-10-27 DIAGNOSIS — Z7901 Long term (current) use of anticoagulants: Secondary | ICD-10-CM | POA: Diagnosis not present

## 2015-10-27 DIAGNOSIS — I4821 Permanent atrial fibrillation: Secondary | ICD-10-CM

## 2015-10-27 LAB — POCT INR: INR: 3.1
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
## 2015-11-10 ENCOUNTER — Ambulatory Visit (INDEPENDENT_AMBULATORY_CARE_PROVIDER_SITE_OTHER): Payer: Medicare Other | Admitting: Internal Medicine

## 2015-11-10 ENCOUNTER — Encounter: Payer: Self-pay | Admitting: Internal Medicine

## 2015-11-10 DIAGNOSIS — Z7901 Long term (current) use of anticoagulants: Secondary | ICD-10-CM

## 2015-11-10 DIAGNOSIS — Z5181 Encounter for therapeutic drug level monitoring: Secondary | ICD-10-CM

## 2015-11-10 LAB — POCT INR: INR: 2.5

## 2015-11-10 NOTE — Progress Notes (Signed)
Lib's INR today was 2.5 on 3mg  of coumadin daily.  Cont same dose and recheck INR in 1 month (barring no addition of abx or major med changes).

## 2015-11-24 ENCOUNTER — Encounter: Payer: Self-pay | Admitting: Internal Medicine

## 2015-11-24 ENCOUNTER — Ambulatory Visit (INDEPENDENT_AMBULATORY_CARE_PROVIDER_SITE_OTHER): Payer: Medicare Other | Admitting: *Deleted

## 2015-11-24 DIAGNOSIS — I482 Chronic atrial fibrillation: Secondary | ICD-10-CM

## 2015-11-24 DIAGNOSIS — I4821 Permanent atrial fibrillation: Secondary | ICD-10-CM

## 2015-11-24 DIAGNOSIS — Z950 Presence of cardiac pacemaker: Secondary | ICD-10-CM | POA: Diagnosis not present

## 2015-11-24 LAB — CUP PACEART INCLINIC DEVICE CHECK
Battery Impedance: 1299 Ohm
Battery Remaining Longevity: 41 mo
Battery Voltage: 2.76 V
Brady Statistic RV Percent Paced: 72 %
Date Time Interrogation Session: 20171101154718
Implantable Lead Model: 4092
Implantable Pulse Generator Implant Date: 20140730
Lead Channel Sensing Intrinsic Amplitude: 11.2 mV
Lead Channel Setting Pacing Amplitude: 2.5 V
Lead Channel Setting Pacing Pulse Width: 0.4 ms
MDC IDC LEAD IMPLANT DT: 20090107
MDC IDC LEAD LOCATION: 753860
MDC IDC MSMT LEADCHNL RA IMPEDANCE VALUE: 0 Ohm
MDC IDC MSMT LEADCHNL RV IMPEDANCE VALUE: 422 Ohm
MDC IDC MSMT LEADCHNL RV PACING THRESHOLD AMPLITUDE: 1.25 V
MDC IDC MSMT LEADCHNL RV PACING THRESHOLD PULSEWIDTH: 0.4 ms
MDC IDC SET LEADCHNL RV SENSING SENSITIVITY: 4 mV

## 2015-11-24 NOTE — Patient Instructions (Signed)
Your physician wants you to follow-up in: May 2018 with Dr. Allred. You will receive a reminder letter in the mail two months in advance. If you don't receive a letter, please call our office to schedule the follow-up appointment.  

## 2015-11-24 NOTE — Progress Notes (Signed)
Pacemaker check in clinic. Normal device function. Thresholds, sensing, impedances consistent with previous measurements. Device programmed to maximize longevity. Permanent AF +warfarin. No high ventricular rates noted. Device programmed at appropriate safety margins; RV min output reprogrammed to 2.5VC. Histogram distribution appropriate for patient activity level. Device programmed to optimize intrinsic conduction. Estimated longevity 3.5 years. Patient education completed. ROV with JA in 05/2016.

## 2015-12-08 ENCOUNTER — Encounter: Payer: Self-pay | Admitting: Internal Medicine

## 2015-12-08 ENCOUNTER — Non-Acute Institutional Stay: Payer: Medicare Other | Admitting: Internal Medicine

## 2015-12-08 VITALS — BP 128/62 | HR 63 | Temp 98.4°F | Wt 142.0 lb

## 2015-12-08 DIAGNOSIS — R197 Diarrhea, unspecified: Secondary | ICD-10-CM

## 2015-12-08 DIAGNOSIS — H353290 Exudative age-related macular degeneration, unspecified eye, stage unspecified: Secondary | ICD-10-CM

## 2015-12-08 DIAGNOSIS — Z7901 Long term (current) use of anticoagulants: Secondary | ICD-10-CM | POA: Diagnosis not present

## 2015-12-08 DIAGNOSIS — N184 Chronic kidney disease, stage 4 (severe): Secondary | ICD-10-CM

## 2015-12-08 DIAGNOSIS — I5032 Chronic diastolic (congestive) heart failure: Secondary | ICD-10-CM

## 2015-12-08 LAB — POCT INR: INR: 3.3
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
## 2015-12-09 ENCOUNTER — Encounter (HOSPITAL_COMMUNITY): Payer: Self-pay | Admitting: Emergency Medicine

## 2015-12-09 ENCOUNTER — Emergency Department (HOSPITAL_COMMUNITY): Payer: Medicare Other

## 2015-12-09 ENCOUNTER — Emergency Department (HOSPITAL_COMMUNITY)
Admission: EM | Admit: 2015-12-09 | Discharge: 2015-12-09 | Disposition: A | Discharge status: Home / Self Care | Payer: Medicare Other | Attending: Emergency Medicine | Admitting: Emergency Medicine

## 2015-12-09 DIAGNOSIS — I130 Hypertensive heart and chronic kidney disease with heart failure and stage 1 through stage 4 chronic kidney disease, or unspecified chronic kidney disease: Secondary | ICD-10-CM | POA: Diagnosis not present

## 2015-12-09 DIAGNOSIS — S0181XA Laceration without foreign body of other part of head, initial encounter: Secondary | ICD-10-CM | POA: Diagnosis not present

## 2015-12-09 DIAGNOSIS — I5032 Chronic diastolic (congestive) heart failure: Secondary | ICD-10-CM | POA: Diagnosis not present

## 2015-12-09 DIAGNOSIS — I2510 Atherosclerotic heart disease of native coronary artery without angina pectoris: Secondary | ICD-10-CM | POA: Insufficient documentation

## 2015-12-09 DIAGNOSIS — W01198A Fall on same level from slipping, tripping and stumbling with subsequent striking against other object, initial encounter: Secondary | ICD-10-CM | POA: Insufficient documentation

## 2015-12-09 DIAGNOSIS — I252 Old myocardial infarction: Secondary | ICD-10-CM | POA: Diagnosis not present

## 2015-12-09 DIAGNOSIS — Z950 Presence of cardiac pacemaker: Secondary | ICD-10-CM | POA: Diagnosis not present

## 2015-12-09 DIAGNOSIS — Y939 Activity, unspecified: Secondary | ICD-10-CM | POA: Diagnosis not present

## 2015-12-09 DIAGNOSIS — Z955 Presence of coronary angioplasty implant and graft: Secondary | ICD-10-CM | POA: Insufficient documentation

## 2015-12-09 DIAGNOSIS — W19XXXA Unspecified fall, initial encounter: Secondary | ICD-10-CM

## 2015-12-09 DIAGNOSIS — Y929 Unspecified place or not applicable: Secondary | ICD-10-CM | POA: Diagnosis not present

## 2015-12-09 DIAGNOSIS — S5012XA Contusion of left forearm, initial encounter: Secondary | ICD-10-CM | POA: Insufficient documentation

## 2015-12-09 DIAGNOSIS — N184 Chronic kidney disease, stage 4 (severe): Secondary | ICD-10-CM | POA: Diagnosis not present

## 2015-12-09 DIAGNOSIS — S0990XA Unspecified injury of head, initial encounter: Secondary | ICD-10-CM | POA: Diagnosis present

## 2015-12-09 DIAGNOSIS — Y999 Unspecified external cause status: Secondary | ICD-10-CM | POA: Diagnosis not present

## 2015-12-09 DIAGNOSIS — Z7901 Long term (current) use of anticoagulants: Secondary | ICD-10-CM | POA: Diagnosis not present

## 2015-12-09 DIAGNOSIS — R10815 Periumbilic abdominal tenderness: Secondary | ICD-10-CM | POA: Insufficient documentation

## 2015-12-09 LAB — PROTIME-INR
INR: 2.88
Prothrombin Time: 30.8 seconds — ABNORMAL HIGH (ref 11.4–15.2)

## 2015-12-09 NOTE — ED Triage Notes (Signed)
Pt arrives via [facility] from [facility], ems reports pt slipped when trying to sit in a chair this morning, pt fell forward and shattered a glass cabinet with her head. Pt is on coumadin, INR yesterday was 3.3. Pt denies LOC, n/v or headache. Pt has small lac to rt cheek, hematoma to left forearm and cheek. Ems reports pt has been a/o4 with no neuro deficits present. Speech clear, resp e/u, pt remains a/ox4.

## 2015-12-09 NOTE — Discharge Instructions (Signed)
You can use ice packs on areas that are sore from your fall Avoid using ibuprofen or aspirin Return to ED if you develop severe sudden headache, nausea, vomiting, confusion or weakness or tingling in extremities
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
## 2015-12-09 NOTE — ED Provider Notes (Signed)
Medical screening examination/treatment/procedure(s) were conducted as a shared visit with non-physician practitioner(s) and myself.  I personally evaluated the patient during the encounter.  82 year old female on Coumadin for atrial fibrillation presents after mechanical fall and hit her head on some glass breaking the glass. She also has a small laceration to the left scalp and right cheek. She also hit her arm on something and has bruising there but no other evidence of trauma on my exam. She is alert and oriented on complaining of suprapubic pain secondary to the need to urinate. Muscle skeletal exam is otherwise negative. Neurologic exam is intact aside from extraocular movement in her left eye is reduced secondary to her previous stroke. Plan for CT scan and will then ambulate to ensure ability to get around. Low likelihood of systemic disease, Likely discharge.   Marily Memos, MD 12/09/15 1131

## 2015-12-09 NOTE — Discharge Planning (Signed)
EDCM reviewed discharging chart for possible CM needs.  No needs identified.    

## 2015-12-18 ENCOUNTER — Emergency Department (HOSPITAL_COMMUNITY): Payer: Medicare Other

## 2015-12-18 ENCOUNTER — Inpatient Hospital Stay (HOSPITAL_COMMUNITY)
Admission: EM | Admit: 2015-12-18 | Discharge: 2015-12-20 | DRG: 086 | Disposition: A | Discharge status: Skilled Nursing Facility | Payer: Medicare Other | Attending: Internal Medicine | Admitting: Internal Medicine

## 2015-12-18 ENCOUNTER — Encounter (HOSPITAL_COMMUNITY): Payer: Self-pay

## 2015-12-18 DIAGNOSIS — S065X0A Traumatic subdural hemorrhage without loss of consciousness, initial encounter: Secondary | ICD-10-CM | POA: Diagnosis not present

## 2015-12-18 DIAGNOSIS — F329 Major depressive disorder, single episode, unspecified: Secondary | ICD-10-CM | POA: Diagnosis present

## 2015-12-18 DIAGNOSIS — I5032 Chronic diastolic (congestive) heart failure: Secondary | ICD-10-CM | POA: Diagnosis not present

## 2015-12-18 DIAGNOSIS — E785 Hyperlipidemia, unspecified: Secondary | ICD-10-CM | POA: Diagnosis present

## 2015-12-18 DIAGNOSIS — I130 Hypertensive heart and chronic kidney disease with heart failure and stage 1 through stage 4 chronic kidney disease, or unspecified chronic kidney disease: Secondary | ICD-10-CM | POA: Diagnosis present

## 2015-12-18 DIAGNOSIS — J45909 Unspecified asthma, uncomplicated: Secondary | ICD-10-CM | POA: Diagnosis present

## 2015-12-18 DIAGNOSIS — I6200 Nontraumatic subdural hemorrhage, unspecified: Secondary | ICD-10-CM | POA: Diagnosis not present

## 2015-12-18 DIAGNOSIS — K589 Irritable bowel syndrome without diarrhea: Secondary | ICD-10-CM | POA: Diagnosis present

## 2015-12-18 DIAGNOSIS — H353230 Exudative age-related macular degeneration, bilateral, stage unspecified: Secondary | ICD-10-CM | POA: Diagnosis present

## 2015-12-18 DIAGNOSIS — F411 Generalized anxiety disorder: Secondary | ICD-10-CM | POA: Diagnosis present

## 2015-12-18 DIAGNOSIS — I671 Cerebral aneurysm, nonruptured: Secondary | ICD-10-CM | POA: Diagnosis present

## 2015-12-18 DIAGNOSIS — S065XAA Traumatic subdural hemorrhage with loss of consciousness status unknown, initial encounter: Secondary | ICD-10-CM | POA: Diagnosis present

## 2015-12-18 DIAGNOSIS — N184 Chronic kidney disease, stage 4 (severe): Secondary | ICD-10-CM | POA: Diagnosis present

## 2015-12-18 DIAGNOSIS — I482 Chronic atrial fibrillation: Secondary | ICD-10-CM | POA: Diagnosis present

## 2015-12-18 DIAGNOSIS — I4891 Unspecified atrial fibrillation: Secondary | ICD-10-CM | POA: Diagnosis not present

## 2015-12-18 DIAGNOSIS — Z7901 Long term (current) use of anticoagulants: Secondary | ICD-10-CM

## 2015-12-18 DIAGNOSIS — E041 Nontoxic single thyroid nodule: Secondary | ICD-10-CM | POA: Diagnosis present

## 2015-12-18 DIAGNOSIS — S8012XA Contusion of left lower leg, initial encounter: Secondary | ICD-10-CM | POA: Diagnosis present

## 2015-12-18 DIAGNOSIS — M109 Gout, unspecified: Secondary | ICD-10-CM | POA: Diagnosis present

## 2015-12-18 DIAGNOSIS — N179 Acute kidney failure, unspecified: Secondary | ICD-10-CM | POA: Diagnosis present

## 2015-12-18 DIAGNOSIS — W1830XA Fall on same level, unspecified, initial encounter: Secondary | ICD-10-CM | POA: Diagnosis present

## 2015-12-18 DIAGNOSIS — Z885 Allergy status to narcotic agent status: Secondary | ICD-10-CM

## 2015-12-18 DIAGNOSIS — Z888 Allergy status to other drugs, medicaments and biological substances status: Secondary | ICD-10-CM

## 2015-12-18 DIAGNOSIS — Z9049 Acquired absence of other specified parts of digestive tract: Secondary | ICD-10-CM

## 2015-12-18 DIAGNOSIS — D509 Iron deficiency anemia, unspecified: Secondary | ICD-10-CM | POA: Diagnosis present

## 2015-12-18 DIAGNOSIS — Z950 Presence of cardiac pacemaker: Secondary | ICD-10-CM | POA: Diagnosis present

## 2015-12-18 DIAGNOSIS — H5462 Unqualified visual loss, left eye, normal vision right eye: Secondary | ICD-10-CM | POA: Diagnosis present

## 2015-12-18 DIAGNOSIS — E0590 Thyrotoxicosis, unspecified without thyrotoxic crisis or storm: Secondary | ICD-10-CM | POA: Diagnosis present

## 2015-12-18 DIAGNOSIS — Z8249 Family history of ischemic heart disease and other diseases of the circulatory system: Secondary | ICD-10-CM

## 2015-12-18 DIAGNOSIS — I10 Essential (primary) hypertension: Secondary | ICD-10-CM

## 2015-12-18 DIAGNOSIS — Z79899 Other long term (current) drug therapy: Secondary | ICD-10-CM

## 2015-12-18 DIAGNOSIS — G2581 Restless legs syndrome: Secondary | ICD-10-CM | POA: Diagnosis present

## 2015-12-18 DIAGNOSIS — I252 Old myocardial infarction: Secondary | ICD-10-CM

## 2015-12-18 DIAGNOSIS — Z955 Presence of coronary angioplasty implant and graft: Secondary | ICD-10-CM

## 2015-12-18 DIAGNOSIS — I5033 Acute on chronic diastolic (congestive) heart failure: Secondary | ICD-10-CM | POA: Diagnosis present

## 2015-12-18 DIAGNOSIS — I2510 Atherosclerotic heart disease of native coronary artery without angina pectoris: Secondary | ICD-10-CM | POA: Diagnosis present

## 2015-12-18 DIAGNOSIS — I272 Pulmonary hypertension, unspecified: Secondary | ICD-10-CM | POA: Diagnosis present

## 2015-12-18 DIAGNOSIS — G8321 Monoplegia of upper limb affecting right dominant side: Secondary | ICD-10-CM | POA: Diagnosis present

## 2015-12-18 DIAGNOSIS — R29898 Other symptoms and signs involving the musculoskeletal system: Secondary | ICD-10-CM | POA: Diagnosis present

## 2015-12-18 DIAGNOSIS — S065X9A Traumatic subdural hemorrhage with loss of consciousness of unspecified duration, initial encounter: Secondary | ICD-10-CM | POA: Diagnosis present

## 2015-12-18 DIAGNOSIS — F410 Panic disorder [episodic paroxysmal anxiety] without agoraphobia: Secondary | ICD-10-CM | POA: Diagnosis present

## 2015-12-18 DIAGNOSIS — Z91048 Other nonmedicinal substance allergy status: Secondary | ICD-10-CM

## 2015-12-18 DIAGNOSIS — H4922 Sixth [abducent] nerve palsy, left eye: Secondary | ICD-10-CM | POA: Diagnosis present

## 2015-12-18 DIAGNOSIS — I351 Nonrheumatic aortic (valve) insufficiency: Secondary | ICD-10-CM | POA: Diagnosis present

## 2015-12-18 DIAGNOSIS — K219 Gastro-esophageal reflux disease without esophagitis: Secondary | ICD-10-CM | POA: Diagnosis present

## 2015-12-18 LAB — TYPE AND SCREEN
ABO/RH(D): A POS
Antibody Screen: NEGATIVE

## 2015-12-18 LAB — URINALYSIS, ROUTINE W REFLEX MICROSCOPIC
Bilirubin Urine: NEGATIVE
GLUCOSE, UA: NEGATIVE mg/dL
Hgb urine dipstick: NEGATIVE
Ketones, ur: NEGATIVE mg/dL
LEUKOCYTES UA: NEGATIVE
NITRITE: NEGATIVE
PH: 6 (ref 5.0–8.0)
Protein, ur: NEGATIVE mg/dL
Specific Gravity, Urine: 1.005 (ref 1.005–1.030)

## 2015-12-18 LAB — COMPREHENSIVE METABOLIC PANEL
ALT: 10 U/L — ABNORMAL LOW (ref 14–54)
AST: 17 U/L (ref 15–41)
Albumin: 3.7 g/dL (ref 3.5–5.0)
Alkaline Phosphatase: 72 U/L (ref 38–126)
Anion gap: 11 (ref 5–15)
BUN: 32 mg/dL — ABNORMAL HIGH (ref 6–20)
CHLORIDE: 98 mmol/L — AB (ref 101–111)
CO2: 23 mmol/L (ref 22–32)
CREATININE: 2.13 mg/dL — AB (ref 0.44–1.00)
Calcium: 9.4 mg/dL (ref 8.9–10.3)
GFR, EST AFRICAN AMERICAN: 22 mL/min — AB (ref >60)
GFR, EST NON AFRICAN AMERICAN: 19 mL/min — AB (ref >60)
Glucose, Bld: 97 mg/dL (ref 65–99)
POTASSIUM: 4 mmol/L (ref 3.5–5.1)
Sodium: 132 mmol/L — ABNORMAL LOW (ref 135–145)
Total Bilirubin: 1.7 mg/dL — ABNORMAL HIGH (ref 0.3–1.2)
Total Protein: 6.9 g/dL (ref 6.5–8.1)

## 2015-12-18 LAB — CBC
HEMATOCRIT: 34.2 % — AB (ref 36.0–46.0)
Hemoglobin: 11.3 g/dL — ABNORMAL LOW (ref 12.0–15.0)
MCH: 33.5 pg (ref 26.0–34.0)
MCHC: 33 g/dL (ref 30.0–36.0)
MCV: 101.5 fL — AB (ref 78.0–100.0)
Platelets: 187 10*3/uL (ref 150–400)
RBC: 3.37 MIL/uL — ABNORMAL LOW (ref 3.87–5.11)
RDW: 15.3 % (ref 11.5–15.5)
WBC: 6.2 10*3/uL (ref 4.0–10.5)

## 2015-12-18 LAB — DIFFERENTIAL
BASOS PCT: 0 %
Basophils Absolute: 0 10*3/uL (ref 0.0–0.1)
EOS ABS: 0.1 10*3/uL (ref 0.0–0.7)
Eosinophils Relative: 2 %
Lymphocytes Relative: 20 %
Lymphs Abs: 1.2 10*3/uL (ref 0.7–4.0)
MONO ABS: 0.8 10*3/uL (ref 0.1–1.0)
MONOS PCT: 13 %
Neutro Abs: 4 10*3/uL (ref 1.7–7.7)
Neutrophils Relative %: 65 %

## 2015-12-18 LAB — I-STAT TROPONIN, ED: TROPONIN I, POC: 0 ng/mL (ref 0.00–0.08)

## 2015-12-18 LAB — APTT: aPTT: 60 seconds — ABNORMAL HIGH (ref 24–36)

## 2015-12-18 LAB — PROTIME-INR
INR: 2.16
Prothrombin Time: 24.5 seconds — ABNORMAL HIGH (ref 11.4–15.2)

## 2015-12-18 MED ORDER — ONDANSETRON HCL 4 MG/2ML IJ SOLN: 4.0000 mg | Freq: Four times a day (QID) | INTRAMUSCULAR | Status: DC | PRN

## 2015-12-18 MED ORDER — PANTOPRAZOLE SODIUM 40 MG PO TBEC
40.0000 mg | DELAYED_RELEASE_TABLET | Freq: Every day | ORAL | Status: DC
  Administered 2015-12-19 – 2015-12-20 (×2): 40 mg via ORAL
  Filled 2015-12-18 (×2): qty 1

## 2015-12-18 MED ORDER — FUROSEMIDE 80 MG PO TABS
160.0000 mg | ORAL_TABLET | Freq: Two times a day (BID) | ORAL | Status: DC
  Administered 2015-12-19 – 2015-12-20 (×4): 160 mg via ORAL
  Filled 2015-12-18 (×4): qty 2

## 2015-12-18 MED ORDER — FUROSEMIDE 80 MG PO TABS: 160.0000 mg | ORAL_TABLET | Freq: Two times a day (BID) | ORAL | Status: DC

## 2015-12-18 MED ORDER — SIMETHICONE 80 MG PO CHEW
160.0000 mg | CHEWABLE_TABLET | Freq: Every day | ORAL | Status: DC
  Administered 2015-12-18 – 2015-12-19 (×2): 160 mg via ORAL
  Filled 2015-12-18 (×2): qty 2

## 2015-12-18 MED ORDER — FLUTICASONE PROPIONATE 50 MCG/ACT NA SUSP: 2.0000 | Freq: Every day | NASAL | Status: DC | PRN

## 2015-12-18 MED ORDER — SACCHAROMYCES BOULARDII 250 MG PO CAPS
250.0000 mg | ORAL_CAPSULE | Freq: Two times a day (BID) | ORAL | Status: DC
  Administered 2015-12-18 – 2015-12-20 (×4): 250 mg via ORAL
  Filled 2015-12-18 (×4): qty 1

## 2015-12-18 MED ORDER — SODIUM CHLORIDE 0.9% FLUSH
3.0000 mL | Freq: Two times a day (BID) | INTRAVENOUS | Status: DC
  Administered 2015-12-18 – 2015-12-19 (×2): 3 mL via INTRAVENOUS
  Administered 2015-12-20: 10:00:00 via INTRAVENOUS

## 2015-12-18 MED ORDER — DIAZEPAM 5 MG PO TABS: 2.5000 mg | ORAL_TABLET | Freq: Every evening | ORAL | Status: DC | PRN

## 2015-12-18 MED ORDER — CALCIUM CARBONATE-SIMETHICONE 750-80 MG PO CHEW: 2.0000 | CHEWABLE_TABLET | Freq: Every day | ORAL | Status: DC

## 2015-12-18 MED ORDER — ACETAMINOPHEN 650 MG RE SUPP: 650.0000 mg | Freq: Four times a day (QID) | RECTAL | Status: DC | PRN

## 2015-12-18 MED ORDER — CALCIUM CARBONATE ANTACID 500 MG PO CHEW
800.0000 mg | CHEWABLE_TABLET | Freq: Every day | ORAL | Status: DC
  Administered 2015-12-19: 800 mg via ORAL
  Filled 2015-12-18 (×2): qty 4

## 2015-12-18 MED ORDER — ONDANSETRON HCL 4 MG PO TABS: 4.0000 mg | ORAL_TABLET | Freq: Four times a day (QID) | ORAL | Status: DC | PRN

## 2015-12-18 MED ORDER — ALLOPURINOL 100 MG PO TABS
200.0000 mg | ORAL_TABLET | Freq: Every day | ORAL | Status: DC
  Administered 2015-12-19 – 2015-12-20 (×2): 200 mg via ORAL
  Filled 2015-12-18 (×2): qty 2

## 2015-12-18 MED ORDER — LORATADINE 10 MG PO TABS
10.0000 mg | ORAL_TABLET | Freq: Every day | ORAL | Status: DC
  Administered 2015-12-19 – 2015-12-20 (×2): 10 mg via ORAL
  Filled 2015-12-18 (×2): qty 1

## 2015-12-18 MED ORDER — SODIUM CHLORIDE 0.9 % IV SOLN
INTRAVENOUS | Status: AC
  Administered 2015-12-19: via INTRAVENOUS

## 2015-12-18 MED ORDER — SODIUM CHLORIDE 0.9 % IV SOLN
10.0000 mL/h | Freq: Once | INTRAVENOUS | Status: AC
Start: 2015-12-18 — End: 2015-12-18
  Administered 2015-12-18: 10 mL/h via INTRAVENOUS

## 2015-12-18 MED ORDER — METOPROLOL TARTRATE 50 MG PO TABS
50.0000 mg | ORAL_TABLET | Freq: Two times a day (BID) | ORAL | Status: DC
  Administered 2015-12-18 – 2015-12-20 (×4): 50 mg via ORAL
  Filled 2015-12-18 (×4): qty 1

## 2015-12-18 MED ORDER — ACETAMINOPHEN 325 MG PO TABS: 650.0000 mg | ORAL_TABLET | Freq: Four times a day (QID) | ORAL | Status: DC | PRN

## 2015-12-18 MED ORDER — POTASSIUM CHLORIDE CRYS ER 20 MEQ PO TBCR
40.0000 meq | EXTENDED_RELEASE_TABLET | Freq: Two times a day (BID) | ORAL | Status: DC
  Administered 2015-12-19 – 2015-12-20 (×3): 40 meq via ORAL
  Filled 2015-12-18 (×3): qty 2

## 2015-12-18 NOTE — ED Notes (Signed)
Pt. Son [phone] Tracey [name]  Please call with updates.

## 2015-12-18 NOTE — ED Notes (Addendum)
Pt. Coming from [facility] for numbness to right hand following a fall on 12/09/2015. Pt. Seen here and cleared medically. Pt. CT scan today showed subdural hematoma. Pt. On warfarin. Pt. Given 1 full unit FFP and started on 2nd unit FFP. Pt. Passed swallow exam. Pt. NIH 0 and neuro checks 0. No neuro checks due at this time. Pt. 20G IV in left AC. Pt. Full code.

## 2015-12-18 NOTE — ED Triage Notes (Signed)
Pt presents with onset of R hand numbness after falling last Thursday.  Pt was seen here for same and discharged.  Pt reports 2-3 days later, her R hand became numb and has been numb since then.  Pt reports large hematoma to L hip that is getting bigger, pt is on coumadin.
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
## 2015-12-18 NOTE — ED Notes (Signed)
Nurse drawing labs. 
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
## 2015-12-18 NOTE — ED Notes (Signed)
Patient transported to X-ray 

## 2015-12-18 NOTE — ED Notes (Signed)
Admitting at bedside 

## 2015-12-19 ENCOUNTER — Observation Stay (HOSPITAL_COMMUNITY): Payer: Medicare Other

## 2015-12-19 DIAGNOSIS — S8012XA Contusion of left lower leg, initial encounter: Secondary | ICD-10-CM | POA: Diagnosis present

## 2015-12-19 DIAGNOSIS — H4922 Sixth [abducent] nerve palsy, left eye: Secondary | ICD-10-CM | POA: Diagnosis present

## 2015-12-19 DIAGNOSIS — H5462 Unqualified visual loss, left eye, normal vision right eye: Secondary | ICD-10-CM | POA: Diagnosis present

## 2015-12-19 DIAGNOSIS — I482 Chronic atrial fibrillation: Secondary | ICD-10-CM | POA: Diagnosis present

## 2015-12-19 DIAGNOSIS — I671 Cerebral aneurysm, nonruptured: Secondary | ICD-10-CM | POA: Diagnosis present

## 2015-12-19 DIAGNOSIS — I5032 Chronic diastolic (congestive) heart failure: Secondary | ICD-10-CM | POA: Diagnosis present

## 2015-12-19 DIAGNOSIS — G2581 Restless legs syndrome: Secondary | ICD-10-CM | POA: Diagnosis present

## 2015-12-19 DIAGNOSIS — D509 Iron deficiency anemia, unspecified: Secondary | ICD-10-CM | POA: Diagnosis present

## 2015-12-19 DIAGNOSIS — E041 Nontoxic single thyroid nodule: Secondary | ICD-10-CM | POA: Diagnosis present

## 2015-12-19 DIAGNOSIS — I4891 Unspecified atrial fibrillation: Secondary | ICD-10-CM | POA: Diagnosis not present

## 2015-12-19 DIAGNOSIS — I272 Pulmonary hypertension, unspecified: Secondary | ICD-10-CM | POA: Diagnosis present

## 2015-12-19 DIAGNOSIS — E0590 Thyrotoxicosis, unspecified without thyrotoxic crisis or storm: Secondary | ICD-10-CM | POA: Diagnosis present

## 2015-12-19 DIAGNOSIS — I351 Nonrheumatic aortic (valve) insufficiency: Secondary | ICD-10-CM | POA: Diagnosis present

## 2015-12-19 DIAGNOSIS — S065X0A Traumatic subdural hemorrhage without loss of consciousness, initial encounter: Secondary | ICD-10-CM | POA: Diagnosis present

## 2015-12-19 DIAGNOSIS — M109 Gout, unspecified: Secondary | ICD-10-CM | POA: Diagnosis present

## 2015-12-19 DIAGNOSIS — G8321 Monoplegia of upper limb affecting right dominant side: Secondary | ICD-10-CM | POA: Diagnosis present

## 2015-12-19 DIAGNOSIS — I130 Hypertensive heart and chronic kidney disease with heart failure and stage 1 through stage 4 chronic kidney disease, or unspecified chronic kidney disease: Secondary | ICD-10-CM | POA: Diagnosis present

## 2015-12-19 DIAGNOSIS — K589 Irritable bowel syndrome without diarrhea: Secondary | ICD-10-CM | POA: Diagnosis present

## 2015-12-19 DIAGNOSIS — F411 Generalized anxiety disorder: Secondary | ICD-10-CM | POA: Diagnosis present

## 2015-12-19 DIAGNOSIS — I6200 Nontraumatic subdural hemorrhage, unspecified: Secondary | ICD-10-CM | POA: Diagnosis present

## 2015-12-19 DIAGNOSIS — H353230 Exudative age-related macular degeneration, bilateral, stage unspecified: Secondary | ICD-10-CM | POA: Diagnosis present

## 2015-12-19 DIAGNOSIS — F329 Major depressive disorder, single episode, unspecified: Secondary | ICD-10-CM | POA: Diagnosis present

## 2015-12-19 DIAGNOSIS — F410 Panic disorder [episodic paroxysmal anxiety] without agoraphobia: Secondary | ICD-10-CM | POA: Diagnosis present

## 2015-12-19 DIAGNOSIS — W1830XA Fall on same level, unspecified, initial encounter: Secondary | ICD-10-CM | POA: Diagnosis present

## 2015-12-19 DIAGNOSIS — K219 Gastro-esophageal reflux disease without esophagitis: Secondary | ICD-10-CM | POA: Diagnosis present

## 2015-12-19 DIAGNOSIS — I2510 Atherosclerotic heart disease of native coronary artery without angina pectoris: Secondary | ICD-10-CM | POA: Diagnosis present

## 2015-12-19 DIAGNOSIS — N184 Chronic kidney disease, stage 4 (severe): Secondary | ICD-10-CM | POA: Diagnosis present

## 2015-12-19 LAB — PREPARE FRESH FROZEN PLASMA
UNIT DIVISION: 0
Unit division: 0

## 2015-12-19 LAB — BASIC METABOLIC PANEL
Anion gap: 10 (ref 5–15)
BUN: 31 mg/dL — AB (ref 6–20)
CALCIUM: 9.4 mg/dL (ref 8.9–10.3)
CO2: 28 mmol/L (ref 22–32)
CREATININE: 1.85 mg/dL — AB (ref 0.44–1.00)
Chloride: 98 mmol/L — ABNORMAL LOW (ref 101–111)
GFR calc non Af Amer: 23 mL/min — ABNORMAL LOW (ref >60)
GFR, EST AFRICAN AMERICAN: 26 mL/min — AB (ref >60)
Glucose, Bld: 98 mg/dL (ref 65–99)
Potassium: 3.5 mmol/L (ref 3.5–5.1)
SODIUM: 136 mmol/L (ref 135–145)

## 2015-12-19 LAB — CBC
HCT: 32 % — ABNORMAL LOW (ref 36.0–46.0)
Hemoglobin: 10.4 g/dL — ABNORMAL LOW (ref 12.0–15.0)
MCH: 32.8 pg (ref 26.0–34.0)
MCHC: 32.5 g/dL (ref 30.0–36.0)
MCV: 100.9 fL — AB (ref 78.0–100.0)
PLATELETS: 189 10*3/uL (ref 150–400)
RBC: 3.17 MIL/uL — AB (ref 3.87–5.11)
RDW: 15.3 % (ref 11.5–15.5)
WBC: 5.7 10*3/uL (ref 4.0–10.5)

## 2015-12-19 LAB — TSH: TSH: 2.614 u[IU]/mL (ref 0.350–4.500)

## 2015-12-19 LAB — MRSA PCR SCREENING: MRSA BY PCR: NEGATIVE

## 2015-12-19 MED ORDER — HYDRALAZINE HCL 20 MG/ML IJ SOLN: 5.0000 mg | INTRAMUSCULAR | Status: DC | PRN

## 2015-12-19 MED ORDER — VITAMIN K1 10 MG/ML IJ SOLN
5.0000 mg | Freq: Once | INTRAVENOUS | Status: AC
  Administered 2015-12-19: 5 mg via INTRAVENOUS
  Filled 2015-12-19: qty 0.5

## 2015-12-19 NOTE — Evaluation (Signed)
Physical Therapy Evaluation Patient Details Name: Tracey [name] MRN: [id number] DOB: 02-27-1922 Today's Date: 12/19/2015   History of Present Illness  Patient is a 82 yo female admitted 12/18/15 with RUE weakness and difficulty with ambulation.  Patient had a fall on 12/09/15.  Patient found to have SDH, Lt hip hematoma/pain.      PMH:  Afib, hyperthyroidism, CKD, CHF, CAD, anxiety, HLD, HTN, tachy/brady syndrome, pacemaker    Clinical Impression  Patient presents with problems listed below.  Will benefit from acute PT to maximize functional independence prior to discharge.  Patient reports she is "almost back to normal" with mobility today. Able to ambulate 120' with RW and min guard assist.  Feel patient can return to ALF apartment with HHPT f/u for continued therapy, and HH Aide to assist with ADL's (probably for brief period).    Son with multiple questions - answered.    Follow Up Recommendations Home health PT;Supervision - Intermittent (HH Aide to assist with ADL's)    Equipment Recommendations  None recommended by PT    Recommendations for Other Services       Precautions / Restrictions Precautions Precautions: Fall Precaution Comments: Patient legally blind - macular degeneration Restrictions Weight Bearing Restrictions: No      Mobility  Bed Mobility               General bed mobility comments: Patient in chair  Transfers Overall transfer level: Needs assistance Equipment used: Rolling walker (2 wheeled) Transfers: Sit to/from Stand Sit to Stand: Supervision         General transfer comment: Supervision for safety only from chair and BSC.  Ambulation/Gait Ambulation/Gait assistance: Min guard Ambulation Distance (Feet): 120 Feet Assistive device: Rolling walker (2 wheeled) Gait Pattern/deviations: Step-through pattern;Decreased stride length;Trunk flexed Gait velocity: decreased   General Gait Details: Patient with safe use of RW.  Uses RW to  locate obstacles such as door frames.  Patient with good balance with RW.  Able to locate larger obstacles in hallway.    Stairs            Wheelchair Mobility    Modified Rankin (Stroke Patients Only)       Balance Overall balance assessment: Needs assistance;History of Falls Sitting-balance support: No upper extremity supported;Feet supported Sitting balance-Leahy Scale: Good     Standing balance support: No upper extremity supported;During functional activity Standing balance-Leahy Scale: Fair Standing balance comment: UE support on RW during dynamic activities                             Pertinent Vitals/Pain Pain Assessment: No/denies pain    Home Living Family/patient expects to be discharged to:: Assisted living (Patient from [facility] (apartment))               Home Equipment: Dan HumphreysWalker - 2 wheels;Electric scooter (Someone is using her scooter at the moment)      Prior Function Level of Independence: Independent with assistive device(s)         Comments: Patient uses RW for ambulation.  Facility provides meals, housekeeping, and medication management.       Hand Dominance   Dominant Hand: Right    Extremity/Trunk Assessment   Upper Extremity Assessment: Generalized weakness;RUE deficits/detail RUE Deficits / Details: Patient reports hand is much stronger now.  Continues to have minimal "numbness".  Tested normal to light touch.         Lower Extremity Assessment:  Generalized weakness         Communication   Communication: No difficulties  Cognition Arousal/Alertness: Awake/alert Behavior During Therapy: WFL for tasks assessed/performed;Anxious Overall Cognitive Status: Within Functional Limits for tasks assessed                      General Comments      Exercises     Assessment/Plan    PT Assessment Patient needs continued PT services  PT Problem List Decreased strength;Decreased balance;Decreased  mobility;Decreased activity tolerance;Impaired sensation          PT Treatment Interventions DME instruction;Gait training;Functional mobility training;Therapeutic activities;Balance training;Therapeutic exercise;Patient/family education    PT Goals (Current goals can be found in the Care Plan section)  Acute Rehab PT Goals Patient Stated Goal: To return to her home. PT Goal Formulation: With patient/family Time For Goal Achievement: 12/26/15 Potential to Achieve Goals: Good    Frequency Min 3X/week   Barriers to discharge   Patient lives alone in her apartment.    Co-evaluation               End of Session Equipment Utilized During Treatment: Gait belt Activity Tolerance: Patient tolerated treatment well Patient left: in chair;with call bell/phone within reach;with family/visitor present Nurse Communication: Mobility status         Time: 5102-5852 PT Time Calculation (min) (ACUTE ONLY): 40 min   Charges:   PT Evaluation $PT Eval High Complexity: 1 Procedure PT Treatments $Gait Training: 23-37 mins   PT G Codes:        Vena Austria 01/09/2016, 5:10 PM Durenda Hurt. Renaldo Fiddler, [facility] Acute Rehab Services Pager [phone]

## 2015-12-19 NOTE — Progress Notes (Signed)
PROGRESS NOTE  Tracey [name] WGN:[id number] DOB: 1922/07/08 DOA: 12/18/2015 PCP: [name], [name], DO   LOS: 0 days   Brief Narrative: 82 y.o. female with medical history significant for A. fib on Coumadin, hyperthyroidism, GERD, chronic kidney disease stage IV, chronic diastolic heart failure, pseudoaneurysm, CAD, anxiety presents to the emergency Department chief complaint arm weakness. Initial evaluation in the emergency department reveals a subdural hematoma. Neurosurgery consulted.  Assessment & Plan: Principal Problem:   Subdural hematoma (HCC) Active Problems:   Hyperlipidemia   Anxiety state   Essential hypertension   ATRIAL FIBRILLATION   Asthma   GERD   Chronic kidney disease (CKD), stage IV (severe) (HCC)   Iron deficiency anemia   Hyperthyroidism   Pacemaker   Chronic diastolic CHF (congestive heart failure) (HCC)   CAD (coronary artery disease)   Right arm weakness   Subdural hematoma due to fall - Similar history about 10 years ago status post evacuation by Dr. Yetta BarreJones - Discontinue all anticoagulants for now, this was discussed at length with patient at bedside - Dr. Lovell SheehanJenkins with neurosurgery following, appreciate input.  - Patient with repeat CT scan today with stable appearance of the acute focal extra-axial hemorrhage overlying the left frontal lobe, no new hemorrhage - PT evaluation pending - Monitor neurologic exam, may be able to be discharged tomorrow if she remains stable - s/p FFP and vitamin K  Right arm weakness  - Likely related to #1. Unable to do MRI due to pacemaker. See #1 - Resolved  Left hip pain/hematoma  - Related to fall 10 days ago. Large hematoma. Tender to palpation. X-ray without acute abnormality - Supportive therapy  A. fib on Coumadin - Chadvasc score 3.  - INR 2.16 on admission, status post 2 units of FFP as well as vitamin K. Repeat INR tomorrow morning - Continue metoprolol at a slightly lower dose - Monitor on  telemetry  CAD  - No chest pain. EKG without acute changes. - Not on aspirin due to Coumadin, would not start now  Chronic diastolic heart failure  - Compensated. Echo in May of this year mild pulmonary hypertension EF of 55%. Home medications include Lasix. - Continue Lasix - Continue beta blocker  Chronic kidney disease stage IV - Creatinine 2.13 on admission, 1.85 this morning, this is consistent with her baseline  GERD - Continue home meds  Tachybradycardia syndrome - pacemaker  Iron deficiency anemia  - Hemoglobin 11.3 on admission. Close to baseline - Holding Coumadin as above  Anxiety - Continue home meds   DVT prophylaxis: SCD Code Status: Full Family Communication: no family bedside Disposition Plan: back to [facility] in 1 day of neuro intact in am.  Consultants:   Neurosurgery   Procedures:   None   Antimicrobials:  None   Subjective: - no chest pain, shortness of breath, no abdominal pain, nausea or vomiting. Complains of poor vision which is chronic   Objective: Vitals:   12/19/15 0541 12/19/15 0943 12/19/15 1152 12/19/15 1303  BP: (!) 149/61 (!) 176/63 (!) 152/64 140/67  Pulse: 66 61 66 66  Resp: 20 18 20 18   Temp: 98.6 F (37 C) 98.3 F (36.8 C)  98.5 F (36.9 C)  TempSrc: Oral Oral  Oral  SpO2: 96% 97% 98% 97%  Weight:      Height:        Intake/Output Summary (Last 24 hours) at 12/19/15 1349 Last data filed at 12/19/15 0731  Gross per 24 hour  Intake  2453.2 ml  Output                0 ml  Net           2453.2 ml   Filed Weights   12/18/15 1957  Weight: 63.8 kg (140 lb 10.5 oz)    Examination: Constitutional: NAD Vitals:   12/19/15 0541 12/19/15 0943 12/19/15 1152 12/19/15 1303  BP: (!) 149/61 (!) 176/63 (!) 152/64 140/67  Pulse: 66 61 66 66  Resp: 20 18 20 18   Temp: 98.6 F (37 C) 98.3 F (36.8 C)  98.5 F (36.9 C)  TempSrc: Oral Oral  Oral  SpO2: 96% 97% 98% 97%  Weight:      Height:        Eyes: pupils round and reactive,  ENMT: Mucous membranes are moist. Ecchymosis on left Respiratory: clear to auscultation bilaterally, no wheezing, no crackles.  Cardiovascular: Regular rate and rhythm, no murmurs / rubs / gallops. No LE edema. 2+ pedal pulses. No carotid bruits.  Abdomen: no tenderness. Bowel sounds positive.  Musculoskeletal: no clubbing / cyanosis. Bruise on left hip Neurologic: non focal  Psychiatric: Normal judgment and insight. Alert and oriented x 3.    Data Reviewed: I have personally reviewed following labs and imaging studies  CBC:  Recent Labs Lab 12/18/15 1430 12/19/15 0238  WBC 6.2 5.7  NEUTROABS 4.0  --   HGB 11.3* 10.4*  HCT 34.2* 32.0*  MCV 101.5* 100.9*  PLT 187 189   Basic Metabolic Panel:  Recent Labs Lab 12/18/15 1430 12/19/15 0238  NA 132* 136  K 4.0 3.5  CL 98* 98*  CO2 23 28  GLUCOSE 97 98  BUN 32* 31*  CREATININE 2.13* 1.85*  CALCIUM 9.4 9.4   GFR: Estimated Creatinine Clearance: 17.8 mL/min (by C-G formula based on SCr of 1.85 mg/dL (H)). Liver Function Tests:  Recent Labs Lab 12/18/15 1430  AST 17  ALT 10*  ALKPHOS 72  BILITOT 1.7*  PROT 6.9  ALBUMIN 3.7   No results for input(s): LIPASE, AMYLASE in the last 168 hours. No results for input(s): AMMONIA in the last 168 hours. Coagulation Profile:  Recent Labs Lab 12/18/15 1430  INR 2.16   Cardiac Enzymes: No results for input(s): CKTOTAL, CKMB, CKMBINDEX, TROPONINI in the last 168 hours. BNP (last 3 results) No results for input(s): PROBNP in the last 8760 hours. HbA1C: No results for input(s): HGBA1C in the last 72 hours. CBG: No results for input(s): GLUCAP in the last 168 hours. Lipid Profile: No results for input(s): CHOL, HDL, LDLCALC, TRIG, CHOLHDL, LDLDIRECT in the last 72 hours. Thyroid Function Tests:  Recent Labs  12/19/15 0238  TSH 2.614   Anemia Panel: No results for input(s): VITAMINB12, FOLATE, FERRITIN, TIBC, IRON, RETICCTPCT  in the last 72 hours. Urine analysis:    Component Value Date/Time   COLORURINE YELLOW 12/18/2015 1639   APPEARANCEUR CLEAR 12/18/2015 1639   LABSPEC 1.005 12/18/2015 1639   PHURINE 6.0 12/18/2015 1639   GLUCOSEU NEGATIVE 12/18/2015 1639   HGBUR NEGATIVE 12/18/2015 1639   BILIRUBINUR NEGATIVE 12/18/2015 1639   KETONESUR NEGATIVE 12/18/2015 1639   PROTEINUR NEGATIVE 12/18/2015 1639   NITRITE NEGATIVE 12/18/2015 1639   LEUKOCYTESUR NEGATIVE 12/18/2015 1639   Sepsis Labs: Invalid input(s): PROCALCITONIN, LACTICIDVEN  Recent Results (from the past 240 hour(s))  MRSA PCR Screening     Status: None   Collection Time: 12/19/15  2:44 AM  Result Value Ref Range Status   MRSA by  PCR NEGATIVE NEGATIVE Final    Comment:        The GeneXpert MRSA Assay (FDA approved for NASAL specimens only), is one component of a comprehensive MRSA colonization surveillance program. It is not intended to diagnose MRSA infection nor to guide or monitor treatment for MRSA infections.       Radiology Studies: Ct Head Wo Contrast  Result Date: 12/19/2015 CLINICAL DATA:  Follow-up subdural hematoma, left-sided facial bruising, fall several days ago. EXAM: CT HEAD WITHOUT CONTRAST TECHNIQUE: Contiguous axial images were obtained from the base of the skull through the vertex without intravenous contrast. COMPARISON:  CT head dated 12/18/2015 and head CT dated 06/01/2014. FINDINGS: Brain: The acute focal extra-axial hemorrhage overlying the left frontal lobe is stable, measuring 14 mm in width and 8 mm thickness. There is persistent mild mass effect on the underlying parenchyma with perhaps minimal residual rightward midline shift at the level of the frontal horns of the lateral ventricles. Suspect additional trace subdural hemorrhage along the lower aspect of the anterior falx. The additional subdural hemorrhage previously seen over the left temporal lobe has resolved in the interval. No new no parenchymal or  extra-axial hemorrhage. No evidence of parenchymal edema appreciated. Ventricles are stable in size and configuration. Stable size and configuration of the pre pontine extra-axial mass that has been described previously as a probable meningioma. Vascular: There are chronic calcified atherosclerotic changes of the large vessels at the skull base. No unexpected hyperdense vessel. Skull: Surgical changes of previous right craniotomy, stable in alignment. No acute osseous fracture or displacement identified. Sinuses/Orbits: No acute finding. Other: None. IMPRESSION: 1. Stable appearance of the acute focal extra-axial hemorrhage overlying the left frontal lobe, measuring 8 mm thickness, likely subdural as previously described. Minimal residual rightward midline shift. Probable additional trace subdural hemorrhage along the lower aspect of the anterior falx, stable. 2. Interval resolution of the additional subdural hemorrhage overlying the left temporal lobe. 3. No new hemorrhage. 4. Stable appearance of the pre pontine extra-axial mass, previously described as a probable meningioma. Electronically Signed   By: Bary Richard M.D.   On: 12/19/2015 12:32   Ct Head Wo Contrast  Result Date: 12/18/2015 CLINICAL DATA:  Right hand weakness after fall last week. EXAM: CT HEAD WITHOUT CONTRAST TECHNIQUE: Contiguous axial images were obtained from the base of the skull through the vertex without intravenous contrast. COMPARISON:  CT scan of December 09, 2015. FINDINGS: Brain: There is interval development of small left frontal and temporal subdural hematoma, with maximum thickness of 9 mm in 1 focal area. 4 mm of left-to-right midline shift is noted. Ventricular size is within normal limits. Stable appearance of left pre pontine extra-axial mass most consistent with meningioma. Vascular: Atherosclerosis of carotid siphons is noted. Skull: Status post right frontal craniotomy. No acute abnormality seen involving the calvarium.  Sinuses/Orbits: No acute finding. Other: None. IMPRESSION: Interval development left frontal and temporal subdural hematoma, resulting in 4 mm of left-to-right midline shift. Stable appearance of left pre pontine extra-axial mass most consistent with meningioma. Critical Value/emergent results were called by telephone at the time of interpretation on 12/18/2015 at 3:26 pm to Dr. Linwood Dibbles , who verbally acknowledged these results. Electronically Signed   By: Lupita Raider, M.D.   On: 12/18/2015 15:27   Dg Hip Unilat W Or Wo Pelvis 2-3 Views Left  Result Date: 12/18/2015 CLINICAL DATA:  Fall x9 days ago. Lateral left hip pain and bruising. Pt states she has been able  to stand and move on the left leg as usual. No hx of left hip injuries or surgeries. EXAM: DG HIP (WITH OR WITHOUT PELVIS) 2-3V LEFT COMPARISON:  None. FINDINGS: Single view of the pelvis and two views of the left hip are provided. Osseous alignment is normal. No fracture line or displaced fracture fragment identified. No significant degenerative change at either hip joint. Mild degenerative change noted in the lower lumbar spine. Soft tissues about the pelvis and left hip are unremarkable. IMPRESSION: No acute findings. No osseous fracture or dislocation. Mild degenerative change in the lower lumbar spine. Electronically Signed   By: Bary Richard M.D.   On: 12/18/2015 16:39     Scheduled Meds: . allopurinol  200 mg Oral Daily  . calcium carbonate  800 mg of elemental calcium Oral QHS   And  . simethicone  160 mg Oral QHS  . furosemide  160 mg Oral BID  . loratadine  10 mg Oral Daily  . metoprolol tartrate  50 mg Oral BID  . pantoprazole  40 mg Oral Daily  . potassium chloride  40 mEq Oral BID  . saccharomyces boulardii  250 mg Oral BID  . sodium chloride flush  3 mL Intravenous Q12H   Continuous Infusions:   Pamella Pert, MD, PhD [facility] Pager [phone]  If 7PM-7AM, please contact  night-coverage [email or web address] Password [email or web address] 12/19/2015, 1:49 PM
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
## 2015-12-20 DIAGNOSIS — F411 Generalized anxiety disorder: Secondary | ICD-10-CM

## 2015-12-20 DIAGNOSIS — E785 Hyperlipidemia, unspecified: Secondary | ICD-10-CM

## 2015-12-20 DIAGNOSIS — I4891 Unspecified atrial fibrillation: Secondary | ICD-10-CM

## 2015-12-20 DIAGNOSIS — I6200 Nontraumatic subdural hemorrhage, unspecified: Secondary | ICD-10-CM

## 2015-12-20 DIAGNOSIS — E0590 Thyrotoxicosis, unspecified without thyrotoxic crisis or storm: Secondary | ICD-10-CM

## 2015-12-20 DIAGNOSIS — K219 Gastro-esophageal reflux disease without esophagitis: Secondary | ICD-10-CM

## 2015-12-20 DIAGNOSIS — I5032 Chronic diastolic (congestive) heart failure: Secondary | ICD-10-CM

## 2015-12-20 LAB — BASIC METABOLIC PANEL
ANION GAP: 11 (ref 5–15)
BUN: 30 mg/dL — ABNORMAL HIGH (ref 6–20)
CALCIUM: 9.4 mg/dL (ref 8.9–10.3)
CO2: 27 mmol/L (ref 22–32)
Chloride: 97 mmol/L — ABNORMAL LOW (ref 101–111)
Creatinine, Ser: 2 mg/dL — ABNORMAL HIGH (ref 0.44–1.00)
GFR, EST AFRICAN AMERICAN: 24 mL/min — AB (ref >60)
GFR, EST NON AFRICAN AMERICAN: 21 mL/min — AB (ref >60)
Glucose, Bld: 94 mg/dL (ref 65–99)
Potassium: 3.6 mmol/L (ref 3.5–5.1)
Sodium: 135 mmol/L (ref 135–145)

## 2015-12-20 LAB — CBC
HCT: 31.7 % — ABNORMAL LOW (ref 36.0–46.0)
HEMOGLOBIN: 10.4 g/dL — AB (ref 12.0–15.0)
MCH: 33.3 pg (ref 26.0–34.0)
MCHC: 32.8 g/dL (ref 30.0–36.0)
MCV: 101.6 fL — ABNORMAL HIGH (ref 78.0–100.0)
Platelets: 198 10*3/uL (ref 150–400)
RBC: 3.12 MIL/uL — AB (ref 3.87–5.11)
RDW: 15.4 % (ref 11.5–15.5)
WBC: 6 10*3/uL (ref 4.0–10.5)

## 2015-12-20 LAB — PHOSPHORUS: Phosphorus: 2.9 mg/dL (ref 2.5–4.6)

## 2015-12-20 LAB — MAGNESIUM: Magnesium: 1.8 mg/dL (ref 1.7–2.4)

## 2015-12-20 LAB — PROTIME-INR
INR: 1.4
PROTHROMBIN TIME: 17.3 s — AB (ref 11.4–15.2)

## 2015-12-20 MED ORDER — METOPROLOL TARTRATE 50 MG PO TABS
50.0000 mg | ORAL_TABLET | Freq: Two times a day (BID) | ORAL | 0 refills | Status: DC
Start: 2015-12-20 — End: 2016-04-26

## 2015-12-20 NOTE — Discharge Summary (Addendum)
Physician Discharge Summary  Tracey [name]:[id number] DOB: 20-Jun-1922 DOA: 12/18/2015  PCP: Bufford Spikes, DO  Admit date: 12/18/2015 Discharge date: 12/20/2015  Admitted From: ALF Disposition: ALF with Home PT  Recommendations for Outpatient Follow-up:  1. Follow up with PCP in 1-2 weeks 2. Follow up with Neurosurgery Dr. Tressie Stalker on an as needed basis 3. Discuss with PCP about when to possibly restart Coumadin within 1-2 months 4. Please obtain BMP/CBC in one week  Home Health: YES Equipment/Devices: None  Discharge Condition: Stable CODE STATUS: FULL Diet recommendation: Heart Healthy   Brief/Interim Summary: Tracey [name] is a 82 y.o.femalewith medical history significant for A. fib previously anticoagulated on Coumadin, Hyperthyroidism, GERD, Chronic Kidney Disease stage IV, Chronic Diastolic Heart Failure, Pseudoaneurysm, CAD, Anxiety presents to the emergency Department chief complaint arm weakness. Initial evaluation in the emergency department revealed a subdural hematoma from recent Fall. Neurosurgery consulted and Right Arm weakness resolved. PT evaluated the patient and recommended Home Physical Therapy.   Discharge Diagnoses:  Principal Problem:   Subdural hematoma (HCC) Active Problems:   Hyperlipidemia   Anxiety state   Essential hypertension   ATRIAL FIBRILLATION   Asthma   GERD   Chronic kidney disease (CKD), stage IV (severe) (HCC)   Iron deficiency anemia   Hyperthyroidism   Pacemaker   Chronic diastolic CHF (congestive heart failure) (HCC)   CAD (coronary artery disease)   Right arm weakness   SDH (subdural hematoma) (HCC)  Subdural hematoma due to fall - Similar history about 10 years ago status post evacuation by Dr. Yetta Barre (2007) - Discontinue all anticoagulants for now, this was discussed at length with patient at bedside - Dr. Lovell Sheehan with Neurosurgery followed, appreciated input. Recommended avoiding anticoagulants  for the forseeable future and discuss with PCP about reinitiating within 1-2 Months.  - Patient with repeat CT scan yesterday with stable appearance of the acute focal extra-axial hemorrhage overlying the left frontal lobe, no new hemorrhage - PT evaluation recommended Home PT -s/p FFP and vitamin K  Right arm weakness  - Likely related to #1. Unable to do MRI due to pacemaker. See #1 - Resolved  Left hip pain/hematoma  - Related to fall 10 days ago. Large hematoma. Tender to palpation.  - X-ray without acute abnormality; Single view of the pelvis and two views of the left hip areprovided. Osseous alignment is normal. No fracture line or displaced fracture fragment identified. No significant degenerative change at either hip joint. Mild degenerative change noted in the lower lumbar spine. Soft tissues about the pelvis and left hip are unremarkable. - Hb/Hct has been stable at 10.4/31.7 - Continue Supportive therapy  A. fib on Coumadin - Chadvasc score 3.  - INR 2.16 on admission, status post 2 units of FFP as well as vitamin K.  - Repeat INR this AM was 1.40 - Continue Metoprolol at 50 mg po BID  CAD  - No chest pain. EKG without acute changes. - Not on aspirin due to Coumadin, would not start now] - Follow up with PCP as an Outpatient.   Chronic diastolic heart failure  - Compensated. Echo in May of this year mild pulmonary hypertension EF of 55%. Home medications include Lasix. - Continue Home Lasix - Continue Beta Blocker at reduced Dose  Chronic kidney disease stage IV - Creatinine 2.13 on admission - BUN/Cr was 30/2.00 this AM - Repeat BMP as an outpatient  GERD - Continue Home meds  Tachybradycardia syndrome - Patient has Pacemaker  Iron deficiency anemia  - Hemoglobin 11.3 on admission. Close to baseline - Hb/Hct has been stable at 10.4/31.7 - Holding Coumadin as above  Anxiety - Continue Home Meds  Discharge Instructions  Discharge Instructions     Call MD for:  difficulty breathing, headache or visual disturbances    Complete by:  As directed    Call MD for:  persistant dizziness or light-headedness    Complete by:  As directed    Call MD for:  persistant nausea and vomiting    Complete by:  As directed    Call MD for:  severe uncontrolled pain    Complete by:  As directed    Diet - low sodium heart healthy    Complete by:  As directed    Increase activity slowly    Complete by:  As directed        Medication List    STOP taking these medications   metoprolol succinate 100 MG 24 hr tablet Commonly known as:  TOPROL-XL   warfarin 3 MG tablet Commonly known as:  COUMADIN     TAKE these medications   ALKA-SELTZER HEARTBURN + GAS 750-80 MG Chew Generic drug:  Calcium Carbonate-Simethicone Chew 2 tablets by mouth at bedtime.   allopurinol 100 MG tablet Commonly known as:  ZYLOPRIM Take 200 mg by mouth daily.   amoxicillin 500 MG tablet Commonly known as:  AMOXIL as needed. One hour prior to dental appointment   ATROVENT HFA 17 MCG/ACT inhaler Generic drug:  ipratropium Inhale 2 puffs into the lungs every 6 (six) hours as needed for wheezing.   calcitRIOL 0.25 MCG capsule Commonly known as:  ROCALTROL Take 0.25 mcg by mouth daily.   diazepam 5 MG tablet Commonly known as:  VALIUM Take 2.5 mg by mouth at bedtime as needed.   estradiol 0.05 mg/24hr patch Commonly known as:  CLIMARA - Dosed in mg/24 hr Place 1 patch onto the skin once a week.   fluticasone 50 MCG/ACT nasal spray Commonly known as:  FLONASE Place 2 sprays into both nostrils daily as needed (congestion).   furosemide 80 MG tablet Commonly known as:  LASIX Take 160 mg by mouth 2 (two) times daily.   loratadine 10 MG tablet Commonly known as:  CLARITIN Take 10 mg by mouth daily.   metoprolol 50 MG tablet Commonly known as:  LOPRESSOR Take 1 tablet (50 mg total) by mouth 2 (two) times daily.   pantoprazole 40 MG tablet Commonly known  as:  PROTONIX Take 40 mg by mouth daily.   polyethylene glycol powder powder Commonly known as:  GLYCOLAX/MIRALAX Take 0.5 Containers by mouth at bedtime as needed for mild constipation.   potassium chloride SA 20 MEQ tablet Commonly known as:  K-DUR,KLOR-CON Take 40 mEq by mouth 2 (two) times daily.   PRESERVISION AREDS 2 Caps Take 1 capsule by mouth daily.   multivitamin with minerals tablet Take 1 tablet by mouth daily.   saccharomyces boulardii 250 MG capsule Commonly known as:  FLORASTOR Take 1 capsule (250 mg total) by mouth 2 (two) times daily.       Allergies  Allergen Reactions  . Codeine Phosphate Anxiety    "makes me so nervous I feel like I'm dying"  . Meperidine Hcl Anxiety    "makes me think I'm going to die"  . Morphine Sulfate Other (See Comments)    "turned red as a beet"  . Meperidine     Other reaction(s): Other (See Comments) "makes  me feel like I'm dying."  . Diovan [Valsartan] Rash and Other (See Comments)    unknown  . Lopid [Gemfibrozil] Rash and Other (See Comments)  . Tape Rash  . Tricor [Fenofibrate] Rash and Other (See Comments)         Consultations:  Neurosurgery  Procedures/Studies: Ct Head Wo Contrast  Result Date: 12/19/2015 CLINICAL DATA:  Follow-up subdural hematoma, left-sided facial bruising, fall several days ago. EXAM: CT HEAD WITHOUT CONTRAST TECHNIQUE: Contiguous axial images were obtained from the base of the skull through the vertex without intravenous contrast. COMPARISON:  CT head dated 12/18/2015 and head CT dated 06/01/2014. FINDINGS: Brain: The acute focal extra-axial hemorrhage overlying the left frontal lobe is stable, measuring 14 mm in width and 8 mm thickness. There is persistent mild mass effect on the underlying parenchyma with perhaps minimal residual rightward midline shift at the level of the frontal horns of the lateral ventricles. Suspect additional trace subdural hemorrhage along the lower aspect of the  anterior falx. The additional subdural hemorrhage previously seen over the left temporal lobe has resolved in the interval. No new no parenchymal or extra-axial hemorrhage. No evidence of parenchymal edema appreciated. Ventricles are stable in size and configuration. Stable size and configuration of the pre pontine extra-axial mass that has been described previously as a probable meningioma. Vascular: There are chronic calcified atherosclerotic changes of the large vessels at the skull base. No unexpected hyperdense vessel. Skull: Surgical changes of previous right craniotomy, stable in alignment. No acute osseous fracture or displacement identified. Sinuses/Orbits: No acute finding. Other: None. IMPRESSION: 1. Stable appearance of the acute focal extra-axial hemorrhage overlying the left frontal lobe, measuring 8 mm thickness, likely subdural as previously described. Minimal residual rightward midline shift. Probable additional trace subdural hemorrhage along the lower aspect of the anterior falx, stable. 2. Interval resolution of the additional subdural hemorrhage overlying the left temporal lobe. 3. No new hemorrhage. 4. Stable appearance of the pre pontine extra-axial mass, previously described as a probable meningioma. Electronically Signed   By: Bary RichardStan  Maynard M.D.   On: 12/19/2015 12:32   Ct Head Wo Contrast  Result Date: 12/18/2015 CLINICAL DATA:  Right hand weakness after fall last week. EXAM: CT HEAD WITHOUT CONTRAST TECHNIQUE: Contiguous axial images were obtained from the base of the skull through the vertex without intravenous contrast. COMPARISON:  CT scan of December 09, 2015. FINDINGS: Brain: There is interval development of small left frontal and temporal subdural hematoma, with maximum thickness of 9 mm in 1 focal area. 4 mm of left-to-right midline shift is noted. Ventricular size is within normal limits. Stable appearance of left pre pontine extra-axial mass most consistent with meningioma.  Vascular: Atherosclerosis of carotid siphons is noted. Skull: Status post right frontal craniotomy. No acute abnormality seen involving the calvarium. Sinuses/Orbits: No acute finding. Other: None. IMPRESSION: Interval development left frontal and temporal subdural hematoma, resulting in 4 mm of left-to-right midline shift. Stable appearance of left pre pontine extra-axial mass most consistent with meningioma. Critical Value/emergent results were called by telephone at the time of interpretation on 12/18/2015 at 3:26 pm to Dr. Linwood DibblesJON KNAPP , who verbally acknowledged these results. Electronically Signed   By: Lupita RaiderJames  Green Jr, M.D.   On: 12/18/2015 15:27   Ct Head Wo Contrast  Result Date: 12/09/2015 CLINICAL DATA:  Status post fall today. EXAM: CT HEAD WITHOUT CONTRAST CT CERVICAL SPINE WITHOUT CONTRAST TECHNIQUE: Multidetector CT imaging of the head and cervical spine was performed following the standard  protocol without intravenous contrast. Multiplanar CT image reconstructions of the cervical spine were also generated. COMPARISON:  Head CT scan 06/01/2014. Head and cervical spine CT scans 01/05/2012. FINDINGS: CT HEAD FINDINGS Brain: Pre pontine mass eccentric to the left seen on the prior examinations is again identified and measures approximately 3.1 x 1.3 cm in the axial plane, not markedly changed since the most recent study. Right frontal craniotomy is also again identified with unchanged thickening of the underlying dura. There is some chronic microvascular ischemic change. Mild ex vacuo dilatation of the temporal tip of the right lateral ventricle is stable. No evidence of acute abnormality including infarct, hemorrhage or midline shift. No pneumocephalus. Vascular: Extensive atherosclerotic vascular disease is identified. Skull: No fracture. Sinuses/Orbits: Unremarkable. Other: None. CT CERVICAL SPINE FINDINGS Alignment: Mild reversal lordosis is unchanged. Skull base and vertebrae: No fracture or  worrisome lesion. Soft tissues and spinal canal: No prevertebral fluid or swelling. No visible canal hematoma. Disc levels: Marked loss of disc space height C4-5, C5-6 and C6-7 is unchanged. Facet degenerative disease appears most notable on the left at C2-3. Upper chest: Small to moderate appearing layering pleural effusions are identified bilaterally. Otherwise negative. Other: None. IMPRESSION: No acute abnormality head or cervical spine. Small to moderate layering pleural effusions bilaterally. Atrophy and chronic microvascular ischemic change. No change in the size of a pre pontine extra-axial mass most consistent with a meningioma. No change in the appearance of cervical spondylosis. Atherosclerosis. Electronically Signed   By: Drusilla Kanner M.D.   On: 12/09/2015 11:28   Ct Cervical Spine Wo Contrast  Result Date: 12/09/2015 CLINICAL DATA:  Status post fall today. EXAM: CT HEAD WITHOUT CONTRAST CT CERVICAL SPINE WITHOUT CONTRAST TECHNIQUE: Multidetector CT imaging of the head and cervical spine was performed following the standard protocol without intravenous contrast. Multiplanar CT image reconstructions of the cervical spine were also generated. COMPARISON:  Head CT scan 06/01/2014. Head and cervical spine CT scans 01/05/2012. FINDINGS: CT HEAD FINDINGS Brain: Pre pontine mass eccentric to the left seen on the prior examinations is again identified and measures approximately 3.1 x 1.3 cm in the axial plane, not markedly changed since the most recent study. Right frontal craniotomy is also again identified with unchanged thickening of the underlying dura. There is some chronic microvascular ischemic change. Mild ex vacuo dilatation of the temporal tip of the right lateral ventricle is stable. No evidence of acute abnormality including infarct, hemorrhage or midline shift. No pneumocephalus. Vascular: Extensive atherosclerotic vascular disease is identified. Skull: No fracture. Sinuses/Orbits:  Unremarkable. Other: None. CT CERVICAL SPINE FINDINGS Alignment: Mild reversal lordosis is unchanged. Skull base and vertebrae: No fracture or worrisome lesion. Soft tissues and spinal canal: No prevertebral fluid or swelling. No visible canal hematoma. Disc levels: Marked loss of disc space height C4-5, C5-6 and C6-7 is unchanged. Facet degenerative disease appears most notable on the left at C2-3. Upper chest: Small to moderate appearing layering pleural effusions are identified bilaterally. Otherwise negative. Other: None. IMPRESSION: No acute abnormality head or cervical spine. Small to moderate layering pleural effusions bilaterally. Atrophy and chronic microvascular ischemic change. No change in the size of a pre pontine extra-axial mass most consistent with a meningioma. No change in the appearance of cervical spondylosis. Atherosclerosis. Electronically Signed   By: Drusilla Kanner M.D.   On: 12/09/2015 11:28   Dg Hip Unilat W Or Wo Pelvis 2-3 Views Left  Result Date: 12/18/2015 CLINICAL DATA:  Fall x9 days ago. Lateral left hip pain  and bruising. Pt states she has been able to stand and move on the left leg as usual. No hx of left hip injuries or surgeries. EXAM: DG HIP (WITH OR WITHOUT PELVIS) 2-3V LEFT COMPARISON:  None. FINDINGS: Single view of the pelvis and two views of the left hip are provided. Osseous alignment is normal. No fracture line or displaced fracture fragment identified. No significant degenerative change at either hip joint. Mild degenerative change noted in the lower lumbar spine. Soft tissues about the pelvis and left hip are unremarkable. IMPRESSION: No acute findings. No osseous fracture or dislocation. Mild degenerative change in the lower lumbar spine. Electronically Signed   By: Bary Richard M.D.   On: 12/18/2015 16:39     Subjective: Seen and examined at bedside and was doing better. States left arm weakness is improved. No other concerns or complaints at this time  except she has poor vision from macular degeneration.   Discharge Exam: Vitals:   12/20/15 0537 12/20/15 1058  BP: (!) 141/68 (!) 158/63  Pulse: 68 70  Resp: 18 19  Temp: 98.4 F (36.9 C)    Vitals:   12/19/15 2125 12/20/15 0158 12/20/15 0537 12/20/15 1058  BP: (!) 141/55 (!) 151/70 (!) 141/68 (!) 158/63  Pulse: 72 63 68 70  Resp: 18 18 18 19   Temp: 98.6 F (37 C) 98.9 F (37.2 C) 98.4 F (36.9 C)   TempSrc: Oral Oral Oral   SpO2: 95% 95% 92% 94%  Weight:      Height:       General: Pt is alert, awake, not in acute distress; Mild Ecchymosis noted from fall on Left face.  Cardiovascular: RRR, S1/S2 +, no rubs, no gallops Respiratory: CTA bilaterally, no wheezing, no rhonchi Abdominal: Soft, NT, ND, bowel sounds + Extremities: no edema, no cyanosis; Bruising noted on left Hip  The results of significant diagnostics from this hospitalization (including imaging, microbiology, ancillary and laboratory) are listed below for reference.    Microbiology: Recent Results (from the past 240 hour(s))  MRSA PCR Screening     Status: None   Collection Time: 12/19/15  2:44 AM  Result Value Ref Range Status   MRSA by PCR NEGATIVE NEGATIVE Final    Comment:        The GeneXpert MRSA Assay (FDA approved for NASAL specimens only), is one component of a comprehensive MRSA colonization surveillance program. It is not intended to diagnose MRSA infection nor to guide or monitor treatment for MRSA infections.     Labs: BNP (last 3 results) No results for input(s): BNP in the last 8760 hours. Basic Metabolic Panel:  Recent Labs Lab 12/18/15 1430 12/19/15 0238 12/20/15 0645  NA 132* 136 135  K 4.0 3.5 3.6  CL 98* 98* 97*  CO2 23 28 27   GLUCOSE 97 98 94  BUN 32* 31* 30*  CREATININE 2.13* 1.85* 2.00*  CALCIUM 9.4 9.4 9.4  MG  --   --  1.8  PHOS  --   --  2.9   Liver Function Tests:  Recent Labs Lab 12/18/15 1430  AST 17  ALT 10*  ALKPHOS 72  BILITOT 1.7*  PROT 6.9   ALBUMIN 3.7   No results for input(s): LIPASE, AMYLASE in the last 168 hours. No results for input(s): AMMONIA in the last 168 hours. CBC:  Recent Labs Lab 12/18/15 1430 12/19/15 0238 12/20/15 0645  WBC 6.2 5.7 6.0  NEUTROABS 4.0  --   --   HGB 11.3*  10.4* 10.4*  HCT 34.2* 32.0* 31.7*  MCV 101.5* 100.9* 101.6*  PLT 187 189 198   Cardiac Enzymes: No results for input(s): CKTOTAL, CKMB, CKMBINDEX, TROPONINI in the last 168 hours. BNP: Invalid input(s): POCBNP CBG: No results for input(s): GLUCAP in the last 168 hours. D-Dimer No results for input(s): DDIMER in the last 72 hours. Hgb A1c No results for input(s): HGBA1C in the last 72 hours. Lipid Profile No results for input(s): CHOL, HDL, LDLCALC, TRIG, CHOLHDL, LDLDIRECT in the last 72 hours. Thyroid function studies  Recent Labs  12/19/15 0238  TSH 2.614   Anemia work up No results for input(s): VITAMINB12, FOLATE, FERRITIN, TIBC, IRON, RETICCTPCT in the last 72 hours. Urinalysis    Component Value Date/Time   COLORURINE YELLOW 12/18/2015 1639   APPEARANCEUR CLEAR 12/18/2015 1639   LABSPEC 1.005 12/18/2015 1639   PHURINE 6.0 12/18/2015 1639   GLUCOSEU NEGATIVE 12/18/2015 1639   HGBUR NEGATIVE 12/18/2015 1639   BILIRUBINUR NEGATIVE 12/18/2015 1639   KETONESUR NEGATIVE 12/18/2015 1639   PROTEINUR NEGATIVE 12/18/2015 1639   NITRITE NEGATIVE 12/18/2015 1639   LEUKOCYTESUR NEGATIVE 12/18/2015 1639   Sepsis Labs Invalid input(s): PROCALCITONIN,  WBC,  LACTICIDVEN Microbiology Recent Results (from the past 240 hour(s))  MRSA PCR Screening     Status: None   Collection Time: 12/19/15  2:44 AM  Result Value Ref Range Status   MRSA by PCR NEGATIVE NEGATIVE Final    Comment:        The GeneXpert MRSA Assay (FDA approved for NASAL specimens only), is one component of a comprehensive MRSA colonization surveillance program. It is not intended to diagnose MRSA infection nor to guide or monitor treatment  for MRSA infections.    Time coordinating discharge: Over 30 minutes  SIGNED:  Merlene Laughter, DO [facility] 12/20/2015, 12:36 PM Pager [phone]  If 7PM-7AM, please contact night-coverage [email or web address] Password [email or web address]

## 2015-12-20 NOTE — Progress Notes (Signed)
Report called to Darl Pikes, nurse @ [facility]. Ride on the way.

## 2015-12-20 NOTE — Care Management Note (Signed)
Case Management Note  Patient Details  Name: Tracey [name] MRN: [id number] Date of Birth: 06/03/1922  Subjective/Objective:                    Action/Plan: Pt was resident of [facility] prior to admission. CM called and spoke to [facility] about returning today and was transferred to the rehab department. [facility] is going to admit [name] to the rehab department for a few days to see how she does before returning her to the ALF. Rehab department asked that orders for PT/aide be faxed to them at: [phone]. CM faxed orders and informed CSW that patient would be going to rehab at [facility]. Pts son would like patient transported via [employer]. Facility and CSW informed.   Expected Discharge Date:                  Expected Discharge Plan:  Skilled Nursing Facility  In-House Referral:  Clinical Social Work  Discharge planning Services  CM Consult  Post Acute Care Choice:  Home Health Choice offered to:     DME Arranged:    DME Agency:     HH Arranged:  PT, Nurse's Aide HH Agency:   Interior and spatial [facility])  Status of Service:  Completed, signed off  If discussed at [employer] of Stay Meetings, dates discussed:    Additional Comments:  Kermit Balo, RN 12/20/2015, 1:03 PM

## 2015-12-20 NOTE — Progress Notes (Signed)
Patient ID: Tracey [name], female   DOB: 1922-02-21, 82 y.o.   MRN: [id number] Subjective:  The patient is alert and pleasant. She looks well. She feels "better". She wants to go back to [facility].  Objective: Vital signs in last 24 hours: Temp:  [98.4 F (36.9 C)-99 F (37.2 C)] 98.4 F (36.9 C) (11/27 0537) Pulse Rate:  [63-72] 70 (11/27 1058) Resp:  [18-19] 19 (11/27 1058) BP: (127-158)/(55-70) 158/63 (11/27 1058) SpO2:  [92 %-96 %] 94 % (11/27 1058)  Intake/Output from previous day: 11/26 0701 - 11/27 0700 In: 357.2 [I.V.:357.2] Out: -  Intake/Output this shift: No intake/output data recorded.  Physical exam patient is alert and oriented. Her speech is normal. Her strength is normal. She has a chronic left sixth nerve palsy.  I reviewed the patient's head CT performed 12/19/2015. She has a small left subdural hematoma with a small posterior fossa subdural hematoma. There is no significant mass effect  Lab Results:  Recent Labs  12/19/15 0238 12/20/15 0645  WBC 5.7 6.0  HGB 10.4* 10.4*  HCT 32.0* 31.7*  PLT 189 198   BMET  Recent Labs  12/19/15 0238 12/20/15 0645  NA 136 135  K 3.5 3.6  CL 98* 97*  CO2 28 27  GLUCOSE 98 94  BUN 31* 30*  CREATININE 1.85* 2.00*  CALCIUM 9.4 9.4    Studies/Results: Ct Head Wo Contrast  Result Date: 12/19/2015 CLINICAL DATA:  Follow-up subdural hematoma, left-sided facial bruising, fall several days ago. EXAM: CT HEAD WITHOUT CONTRAST TECHNIQUE: Contiguous axial images were obtained from the base of the skull through the vertex without intravenous contrast. COMPARISON:  CT head dated 12/18/2015 and head CT dated 06/01/2014. FINDINGS: Brain: The acute focal extra-axial hemorrhage overlying the left frontal lobe is stable, measuring 14 mm in width and 8 mm thickness. There is persistent mild mass effect on the underlying parenchyma with perhaps minimal residual rightward midline shift at the level of the frontal horns of  the lateral ventricles. Suspect additional trace subdural hemorrhage along the lower aspect of the anterior falx. The additional subdural hemorrhage previously seen over the left temporal lobe has resolved in the interval. No new no parenchymal or extra-axial hemorrhage. No evidence of parenchymal edema appreciated. Ventricles are stable in size and configuration. Stable size and configuration of the pre pontine extra-axial mass that has been described previously as a probable meningioma. Vascular: There are chronic calcified atherosclerotic changes of the large vessels at the skull base. No unexpected hyperdense vessel. Skull: Surgical changes of previous right craniotomy, stable in alignment. No acute osseous fracture or displacement identified. Sinuses/Orbits: No acute finding. Other: None. IMPRESSION: 1. Stable appearance of the acute focal extra-axial hemorrhage overlying the left frontal lobe, measuring 8 mm thickness, likely subdural as previously described. Minimal residual rightward midline shift. Probable additional trace subdural hemorrhage along the lower aspect of the anterior falx, stable. 2. Interval resolution of the additional subdural hemorrhage overlying the left temporal lobe. 3. No new hemorrhage. 4. Stable appearance of the pre pontine extra-axial mass, previously described as a probable meningioma. Electronically Signed   By: Bary RichardStan  Maynard M.D.   On: 12/19/2015 12:32   Ct Head Wo Contrast  Result Date: 12/18/2015 CLINICAL DATA:  Right hand weakness after fall last week. EXAM: CT HEAD WITHOUT CONTRAST TECHNIQUE: Contiguous axial images were obtained from the base of the skull through the vertex without intravenous contrast. COMPARISON:  CT scan of December 09, 2015. FINDINGS: Brain: There is interval development  of small left frontal and temporal subdural hematoma, with maximum thickness of 9 mm in 1 focal area. 4 mm of left-to-right midline shift is noted. Ventricular size is within  normal limits. Stable appearance of left pre pontine extra-axial mass most consistent with meningioma. Vascular: Atherosclerosis of carotid siphons is noted. Skull: Status post right frontal craniotomy. No acute abnormality seen involving the calvarium. Sinuses/Orbits: No acute finding. Other: None. IMPRESSION: Interval development left frontal and temporal subdural hematoma, resulting in 4 mm of left-to-right midline shift. Stable appearance of left pre pontine extra-axial mass most consistent with meningioma. Critical Value/emergent results were called by telephone at the time of interpretation on 12/18/2015 at 3:26 pm to Dr. Linwood Dibbles , who verbally acknowledged these results. Electronically Signed   By: Lupita Raider, M.D.   On: 12/18/2015 15:27   Dg Hip Unilat W Or Wo Pelvis 2-3 Views Left  Result Date: 12/18/2015 CLINICAL DATA:  Fall x9 days ago. Lateral left hip pain and bruising. Pt states she has been able to stand and move on the left leg as usual. No hx of left hip injuries or surgeries. EXAM: DG HIP (WITH OR WITHOUT PELVIS) 2-3V LEFT COMPARISON:  None. FINDINGS: Single view of the pelvis and two views of the left hip are provided. Osseous alignment is normal. No fracture line or displaced fracture fragment identified. No significant degenerative change at either hip joint. Mild degenerative change noted in the lower lumbar spine. Soft tissues about the pelvis and left hip are unremarkable. IMPRESSION: No acute findings. No osseous fracture or dislocation. Mild degenerative change in the lower lumbar spine. Electronically Signed   By: Bary Richard M.D.   On: 12/18/2015 16:39    Assessment/Plan: Subdural hematoma: The patient is doing well clinically. She is okay for transfer back to [facility] from my point of view. Please have her follow-up with me on a when necessary basis. Obviously she needs to stay off Coumadin for the foreseeable future. He will be ultimately up to her medical doctors  whether to restart it in a month or 2.  LOS: 1 day     Breckan Cafiero D 12/20/2015, 1:16 PM

## 2015-12-20 NOTE — [place] FL2 (Signed)
[place] MEDICAID FL2 LEVEL OF CARE SCREENING TOOL     IDENTIFICATION  Patient Name: Tracey [name] Birthdate: 11-19-1922 Sex: female Admission Date (Current Location): 12/18/2015  [facility] [place]   Facility and Address:  [facility], [address], [place], [place] [address]      Provider Number: [id number]  Attending Physician Name and Address:  Merlene Laughtermair Latif Sheikh, DO  Relative Name and Phone Number:       Current Level of Care: Hospital Recommended Level of Care: Skilled Nursing Facility Prior Approval Number:    Date Approved/Denied:   PASRR Number:    Discharge Plan: SNF    Current Diagnoses: Patient Active Problem List   Diagnosis Date Noted  . Subdural hematoma (HCC) 12/18/2015  . Right arm weakness 12/18/2015  . SDH (subdural hematoma) (HCC) 12/18/2015  . Chronic diastolic CHF (congestive heart failure) (HCC)   . CKD (chronic kidney disease), stage IV (HCC)   . CAD (coronary artery disease)   . Tachycardia-bradycardia syndrome (HCC)   . Permanent atrial fibrillation (HCC)   . Epistaxis   . Encounter for therapeutic drug monitoring 03/26/2013  . Epistaxis, recurrent 06/04/2012  . Thyroid enlargement 02/29/2012  . Pacemaker 06/15/2011  . Retinal macular atrophy 04/27/2011  . History of surgical procedure 02/02/2011  . Hyperthyroidism 01/05/2011  . AMD (age-related macular degeneration), wet (HCC) 12/08/2010  . Chorioretinal scar, macular 12/08/2010  . Palpitations 11/16/2010  . Long term current use of anticoagulant therapy 04/25/2010  . Asthma 10/22/2009  . EDEMA 06/10/2009  . DYSPNEA 06/10/2009  . RHINITIS 04/08/2009  . HEMOPTYSIS UNSPECIFIED 04/08/2009  . ATRIAL FIBRILLATION 03/17/2009  . CHEST PAIN 11/30/2008  . Tachycardia-bradycardia (HCC) 06/16/2008  . FECAL INCONTINENCE 07/01/2007  . Essential hypertension 06/28/2007  . MYOCARDIAL INFARCTION, HX OF 06/28/2007  . Coronary  atherosclerosis 06/28/2007  . GERD 06/28/2007  . IRRITABLE BOWEL SYNDROME 06/28/2007  . Chronic kidney disease (CKD), stage IV (severe) (HCC) 06/28/2007  . ABDOMINAL PAIN, CHRONIC 06/28/2007  . Iron deficiency anemia 06/28/2007  . Hyperlipidemia 05/21/2007  . Anxiety state 05/21/2007  . ABNORMAL HEART RHYTHMS 05/21/2007  . CEREBRAL ANEURYSM 05/21/2007  . DIVERTICULOSIS, COLON 05/21/2007  . Gouty arthritis 05/21/2007    Orientation RESPIRATION BLADDER Height & Weight     Self, Place  Normal Continent Weight: 140 lb 10.5 oz (63.8 kg) Height:  5\' 3"  (160 cm)  BEHAVIORAL SYMPTOMS/MOOD NEUROLOGICAL BOWEL NUTRITION STATUS      Continent Diet (Heart Healthy, Thin Liquids)  AMBULATORY STATUS COMMUNICATION OF NEEDS Skin   Limited Assist Verbally Normal                       Personal Care Assistance Level of Assistance  Bathing, Dressing, Feeding Bathing Assistance: Limited assistance Feeding assistance: Independent Dressing Assistance: Limited assistance     Functional Limitations Info  Sight, Hearing, Speech Sight Info: Adequate Hearing Info: Adequate Speech Info: Adequate    SPECIAL CARE FACTORS FREQUENCY  PT (By licensed PT)     PT Frequency: 5              Contractures Contractures Info: Not present    Additional Factors Info  Code Status, Allergies Code Status Info: Full Code Allergies Info: Codeine Phosphate, Meperidine Hcl, Morphine Sulfate, Meperidine, Diovan Valsartan, Lopid Gemfibrozil, Tape, Tricor Fenofibrate           Current Medications (12/20/2015):  This is the current hospital active medication list Current Facility-Administered  Medications  Medication Dose Route Frequency Provider Last Rate Last Dose  . acetaminophen (TYLENOL) tablet 650 mg  650 mg Oral Q6H PRN Gwenyth Bender, NP       Or  . acetaminophen (TYLENOL) suppository 650 mg  650 mg Rectal Q6H PRN Gwenyth Bender, NP      . allopurinol (ZYLOPRIM) tablet 200 mg  200 mg Oral Daily  Lesle Chris Black, NP   200 mg at 12/20/15 0945  . calcium carbonate (TUMS - dosed in mg elemental calcium) chewable tablet 800 mg of elemental calcium  800 mg of elemental calcium Oral QHS Lesle Chris Black, NP   800 mg of elemental calcium at 12/19/15 2245   And  . simethicone (MYLICON) chewable tablet 160 mg  160 mg Oral QHS Gwenyth Bender, NP   160 mg at 12/19/15 2245  . diazepam (VALIUM) tablet 2.5 mg  2.5 mg Oral QHS PRN Gwenyth Bender, NP      . fluticasone [facility]) 50 MCG/ACT nasal spray 2 spray  2 spray Each Nare Daily PRN Gwenyth Bender, NP      . furosemide (LASIX) tablet 160 mg  160 mg Oral BID Gwenyth Bender, NP   160 mg at 12/20/15 0944  . hydrALAZINE (APRESOLINE) injection 5 mg  5 mg Intravenous Q4H PRN Leatha Gilding, MD      . loratadine (CLARITIN) tablet 10 mg  10 mg Oral Daily Gwenyth Bender, NP   10 mg at 12/20/15 0948  . metoprolol tartrate (LOPRESSOR) tablet 50 mg  50 mg Oral BID Gwenyth Bender, NP   50 mg at 12/20/15 0948  . ondansetron (ZOFRAN) tablet 4 mg  4 mg Oral Q6H PRN Gwenyth Bender, NP       Or  . ondansetron [facility]) injection 4 mg  4 mg Intravenous Q6H PRN Gwenyth Bender, NP      . pantoprazole (PROTONIX) EC tablet 40 mg  40 mg Oral Daily Gwenyth Bender, NP   40 mg at 12/20/15 0948  . potassium chloride SA (K-DUR,KLOR-CON) CR tablet 40 mEq  40 mEq Oral BID Gwenyth Bender, NP   40 mEq at 12/20/15 0943  . saccharomyces boulardii (FLORASTOR) capsule 250 mg  250 mg Oral BID Gwenyth Bender, NP   250 mg at 12/20/15 0943  . sodium chloride flush (NS) 0.9 % injection 3 mL  3 mL Intravenous Q12H Gwenyth Bender, NP         Discharge Medications: Please see discharge summary for a list of discharge medications.  Relevant Imaging Results:  Relevant Lab Results:   Additional Information SSN:  [id number]  Dede Query, LCSW

## 2015-12-21 ENCOUNTER — Non-Acute Institutional Stay (SKILLED_NURSING_FACILITY): Payer: Medicare Other | Admitting: Internal Medicine

## 2015-12-21 ENCOUNTER — Encounter: Payer: Self-pay | Admitting: Internal Medicine

## 2015-12-21 DIAGNOSIS — S7012XA Contusion of left thigh, initial encounter: Secondary | ICD-10-CM

## 2015-12-21 DIAGNOSIS — N184 Chronic kidney disease, stage 4 (severe): Secondary | ICD-10-CM

## 2015-12-21 DIAGNOSIS — I2510 Atherosclerotic heart disease of native coronary artery without angina pectoris: Secondary | ICD-10-CM | POA: Diagnosis not present

## 2015-12-21 DIAGNOSIS — I5032 Chronic diastolic (congestive) heart failure: Secondary | ICD-10-CM | POA: Diagnosis not present

## 2015-12-21 DIAGNOSIS — I482 Chronic atrial fibrillation: Secondary | ICD-10-CM

## 2015-12-21 DIAGNOSIS — I4821 Permanent atrial fibrillation: Secondary | ICD-10-CM

## 2015-12-21 DIAGNOSIS — I6200 Nontraumatic subdural hemorrhage, unspecified: Secondary | ICD-10-CM

## 2015-12-21 DIAGNOSIS — K582 Mixed irritable bowel syndrome: Secondary | ICD-10-CM

## 2015-12-21 DIAGNOSIS — H353290 Exudative age-related macular degeneration, unspecified eye, stage unspecified: Secondary | ICD-10-CM

## 2015-12-21 DIAGNOSIS — R29898 Other symptoms and signs involving the musculoskeletal system: Secondary | ICD-10-CM | POA: Diagnosis not present

## 2015-12-21 DIAGNOSIS — S065X9A Traumatic subdural hemorrhage with loss of consciousness of unspecified duration, initial encounter: Secondary | ICD-10-CM

## 2015-12-21 DIAGNOSIS — S065XAA Traumatic subdural hemorrhage with loss of consciousness status unknown, initial encounter: Secondary | ICD-10-CM
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
## 2015-12-21 NOTE — Clinical Social Work Note (Signed)
Clinical Social Work Assessment  Patient Details  Name: Tracey [name] MRN: [id number] Date of Birth: [birth year]  Date of referral:  12/20/15               Reason for consult:  Facility Placement, Discharge Planning                Permission sought to share information with:  Family Supports Permission granted to share information::  Yes, Verbal Permission Granted  Name::     Tracey [name]  Relationship::  nephew  Contact Information:  [phone]  Housing/Transportation Living arrangements for the past 2 months: Assisted Living  Source of Information:  Patient Patient Interpreter Needed:  None Criminal Activity/Legal Involvement Pertinent to Current Situation/Hospitalization:  No - Comment as needed Significant Relationships:  Adult Children, Other Family Members Lives with:  Self Do you feel safe going back to the place where you live?  Yes Need for family participation in patient care:  Yes (Comment)  Care giving concerns:  No care giving concerns  Social Worker assessment / plan:  CSW met with pt to address consult. Pt is a resident of [facility] and will go to SNF for rehab prior to returning to her ALF. Pt is agreeable to discharge plan. Facility is ready to accept pt as they have received discharge information. Facility is all private pay, therefore no PASARR or insurance auth needed, per facility. Facility will provide transportation. RN called report. CSW updated pt's nephew, who is aware and agreeable to discharge plan. CSW is signing off as no further needs identified.   Employment status:  Retired Nurse, adult PT Recommendations:  [place] / Referral to community resources:  [place]  Patient/Family's Response to care:  Pt was appreciative of CSW support.   Patient/Family's Understanding of and Emotional Response to Diagnosis, Current Treatment, and Prognosis:  Pt and nephew are aware that  pt will go to SNF for eval and rehab prior to returning to ALF.   Emotional Assessment Appearance:  Appears stated age Attitude/Demeanor/Rapport:  Other (Appropriate) Affect (typically observed):  Accepting, Adaptable, Pleasant Orientation:  Oriented to Self, Oriented to Place, Oriented to  Time, Oriented to Situation Alcohol / Substance use:  Never Used Psych involvement (Current and /or in the community):  No (Comment)  Discharge Needs  Concerns to be addressed:  Adjustment to Illness Readmission within the last 30 days:  No Current discharge risk:  None Barriers to Discharge:  No Barriers Identified   Darden Dates, LCSW 12/21/2015, 12:08 PM

## 2015-12-22 ENCOUNTER — Encounter: Payer: Self-pay | Admitting: Internal Medicine

## 2015-12-22 ENCOUNTER — Telehealth: Payer: Self-pay

## 2015-12-22 NOTE — Telephone Encounter (Signed)
Patient was seen 12/21/15 at [facility]

## 2015-12-22 NOTE — Telephone Encounter (Signed)
Re-admission to facility. This is a patient you were seeing at [facility] . [facility] - Hospital F/U is needed if patient was re-admitted to facility upon discharge. Hospital discharge from [facility] on 12/20/15.

## 2015-12-27 LAB — CBC AND DIFFERENTIAL
HCT: 41 % (ref 36–46)
Hemoglobin: 12.7 g/dL (ref 12.0–16.0)
Platelets: 198 10*3/uL (ref 150–399)
WBC: 7 10^3/mL

## 2015-12-27 LAB — BASIC METABOLIC PANEL
BUN: 34 mg/dL — AB (ref 4–21)
Creatinine: 2 mg/dL — AB (ref 0.5–1.1)
Glucose: 110 mg/dL
Potassium: 4.1 mmol/L (ref 3.4–5.3)
Sodium: 138 mmol/L (ref 137–147)

## 2015-12-28 ENCOUNTER — Telehealth: Payer: Self-pay

## 2015-12-28 NOTE — Telephone Encounter (Signed)
Transition Care Management Follow-Up Telephone Call   Date discharged and where: [facility] (fall on 12/10/15 then re-admitted on 12/18/15 for arm weakness and unusual behavior. Found a subdural hematoma from previous fall) ; 12/20/15  How have you been since you were released from the hospital? Pt was sent to [facility]; spoke with pt's nurse. She states that the patient still does not feel good, rather sluggish and more moody. Not her typical self.   Any patient concerns? Nurse states the pt told them she needed to F/U w/ her neurologist Dr.Jeffery Lovell Sheehan, however Dr. Lovell Sheehan office states she only needs a F/U with PCP.  Items Reviewed:   Meds: Y; Coumadin D/C'd due to fall  Allergies: No New   Dietary Changes Reviewed: Y  Functional Questionnaire:  Independent-I Dependent-D  ADLs:   Dressing- Normally Independent, now needs some assistance    Eating- I   Maintaining continence- I   Transferring- I   Transportation- D   Meal Prep- D   Managing Meds- D  Confirmed importance and Date/Time of follow-up visits scheduled: Unable to get an appt w/ Dr. Renato Gails at the clinic at [facility], so [facility] transportation will bring pt to the office on Thursday, 12/30/15 for TOC F/U.    Confirmed with patient if condition worsens to call PCP or go to the Emergency Dept. Patient was given office number and encouraged to call back with questions or concerns: Yes

## 2015-12-29 ENCOUNTER — Telehealth: Payer: Self-pay

## 2015-12-29 NOTE — Telephone Encounter (Signed)
Pt was seen last Tuesday, 11/28 when she returned from the hospital for her subdural and went to rehab first at [facility].  Since then, she had made great improvements and returned to her assisted living apt.  She has an appt at the end of December and the appt for this Thursday at the office should be canceled.  It will be too much for her to be transported to this appt.  Her son has plans to attend the appt later this month so we can address her goals of care more thoroughly.  I believe Nicole Cella spoke with the patient yesterday who said she needed the appt due to the problem she had going to the hospital, not realizing this was already addressed.

## 2015-12-29 NOTE — Telephone Encounter (Signed)
Evidently, everyone thinks this appt needs to be kept due to chronic ongoing concerns and transportation was already set up.  I'll see her tomorrow.

## 2015-12-29 NOTE — Telephone Encounter (Signed)
Ok, I will call the patient and also speak with her nurse to let her know we will be cancelling her appointment tomorrow. Thank you.

## 2015-12-30 ENCOUNTER — Ambulatory Visit (INDEPENDENT_AMBULATORY_CARE_PROVIDER_SITE_OTHER): Payer: Medicare Other | Admitting: Internal Medicine

## 2015-12-30 ENCOUNTER — Encounter: Payer: Medicare Other | Admitting: Internal Medicine

## 2015-12-30 VITALS — BP 128/70 | HR 81 | Temp 98.4°F | Wt 132.0 lb

## 2015-12-30 DIAGNOSIS — I482 Chronic atrial fibrillation: Secondary | ICD-10-CM | POA: Diagnosis not present

## 2015-12-30 DIAGNOSIS — R251 Tremor, unspecified: Secondary | ICD-10-CM

## 2015-12-30 DIAGNOSIS — H353290 Exudative age-related macular degeneration, unspecified eye, stage unspecified: Secondary | ICD-10-CM

## 2015-12-30 DIAGNOSIS — Z79899 Other long term (current) drug therapy: Secondary | ICD-10-CM

## 2015-12-30 DIAGNOSIS — N184 Chronic kidney disease, stage 4 (severe): Secondary | ICD-10-CM

## 2015-12-30 DIAGNOSIS — S065XAA Traumatic subdural hemorrhage with loss of consciousness status unknown, initial encounter: Secondary | ICD-10-CM

## 2015-12-30 DIAGNOSIS — F331 Major depressive disorder, recurrent, moderate: Secondary | ICD-10-CM

## 2015-12-30 DIAGNOSIS — I6200 Nontraumatic subdural hemorrhage, unspecified: Secondary | ICD-10-CM | POA: Diagnosis not present

## 2015-12-30 DIAGNOSIS — S065X9A Traumatic subdural hemorrhage with loss of consciousness of unspecified duration, initial encounter: Secondary | ICD-10-CM

## 2015-12-30 DIAGNOSIS — I4821 Permanent atrial fibrillation: Secondary | ICD-10-CM

## 2015-12-30 MED ORDER — DULOXETINE HCL 20 MG PO CPEP: 20.0000 mg | ORAL_CAPSULE | Freq: Every day | ORAL | 3 refills | Status: DC

## 2015-12-30 MED ORDER — ESTRADIOL 0.025 MG/24HR TD PTWK: 0.0250 mg | MEDICATED_PATCH | TRANSDERMAL | 12 refills | Status: DC
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
## 2016-01-12 ENCOUNTER — Non-Acute Institutional Stay: Payer: Medicare Other | Admitting: Internal Medicine

## 2016-01-12 VITALS — BP 138/60 | HR 67 | Temp 97.6°F | Wt 135.0 lb

## 2016-01-12 DIAGNOSIS — H353290 Exudative age-related macular degeneration, unspecified eye, stage unspecified: Secondary | ICD-10-CM

## 2016-01-12 DIAGNOSIS — F331 Major depressive disorder, recurrent, moderate: Secondary | ICD-10-CM | POA: Diagnosis not present

## 2016-01-12 DIAGNOSIS — A0472 Enterocolitis due to Clostridium difficile, not specified as recurrent: Secondary | ICD-10-CM

## 2016-01-12 DIAGNOSIS — I6200 Nontraumatic subdural hemorrhage, unspecified: Secondary | ICD-10-CM

## 2016-01-12 DIAGNOSIS — S065XAA Traumatic subdural hemorrhage with loss of consciousness status unknown, initial encounter: Secondary | ICD-10-CM

## 2016-01-12 DIAGNOSIS — Z7409 Other reduced mobility: Secondary | ICD-10-CM | POA: Diagnosis not present

## 2016-01-12 DIAGNOSIS — S065X9A Traumatic subdural hemorrhage with loss of consciousness of unspecified duration, initial encounter: Secondary | ICD-10-CM

## 2016-01-12 DIAGNOSIS — Z79899 Other long term (current) drug therapy: Secondary | ICD-10-CM | POA: Diagnosis not present

## 2016-01-12 DIAGNOSIS — Z7189 Other specified counseling: Secondary | ICD-10-CM
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
## 2016-01-19 ENCOUNTER — Encounter: Payer: Self-pay | Admitting: Internal Medicine

## 2016-01-19 DIAGNOSIS — A0472 Enterocolitis due to Clostridium difficile, not specified as recurrent: Secondary | ICD-10-CM | POA: Insufficient documentation

## 2016-01-20 NOTE — Telephone Encounter (Signed)
error 

## 2016-02-02 ENCOUNTER — Encounter: Payer: Self-pay | Admitting: Internal Medicine

## 2016-02-02 ENCOUNTER — Non-Acute Institutional Stay: Payer: Medicare Other | Admitting: Internal Medicine

## 2016-02-02 VITALS — BP 120/70 | HR 87 | Temp 98.4°F | Wt 131.0 lb

## 2016-02-02 DIAGNOSIS — Z79899 Other long term (current) drug therapy: Secondary | ICD-10-CM

## 2016-02-02 DIAGNOSIS — I6200 Nontraumatic subdural hemorrhage, unspecified: Secondary | ICD-10-CM | POA: Diagnosis not present

## 2016-02-02 DIAGNOSIS — F411 Generalized anxiety disorder: Secondary | ICD-10-CM | POA: Diagnosis not present

## 2016-02-02 DIAGNOSIS — S065XAA Traumatic subdural hemorrhage with loss of consciousness status unknown, initial encounter: Secondary | ICD-10-CM

## 2016-02-02 DIAGNOSIS — Z7409 Other reduced mobility: Secondary | ICD-10-CM | POA: Diagnosis not present

## 2016-02-02 DIAGNOSIS — H353290 Exudative age-related macular degeneration, unspecified eye, stage unspecified: Secondary | ICD-10-CM

## 2016-02-02 DIAGNOSIS — I2510 Atherosclerotic heart disease of native coronary artery without angina pectoris: Secondary | ICD-10-CM

## 2016-02-02 DIAGNOSIS — F331 Major depressive disorder, recurrent, moderate: Secondary | ICD-10-CM

## 2016-02-02 DIAGNOSIS — S065X9A Traumatic subdural hemorrhage with loss of consciousness of unspecified duration, initial encounter: Secondary | ICD-10-CM

## 2016-02-02 MED ORDER — DIAZEPAM 2 MG PO TABS: 2.0000 mg | ORAL_TABLET | Freq: Every evening | ORAL | 0 refills | Status: DC | PRN
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
## 2016-02-07 ENCOUNTER — Other Ambulatory Visit: Payer: Medicare Other

## 2016-02-08 ENCOUNTER — Ambulatory Visit
Admission: RE | Admit: 2016-02-08 | Discharge: 2016-02-08 | Disposition: A | Discharge status: Home / Self Care | Payer: Medicare Other | Source: Ambulatory Visit | Attending: Internal Medicine | Admitting: Internal Medicine

## 2016-02-08 DIAGNOSIS — S065XAA Traumatic subdural hemorrhage with loss of consciousness status unknown, initial encounter: Secondary | ICD-10-CM

## 2016-02-08 DIAGNOSIS — S065X9A Traumatic subdural hemorrhage with loss of consciousness of unspecified duration, initial encounter: Secondary | ICD-10-CM

## 2016-02-23 ENCOUNTER — Non-Acute Institutional Stay: Payer: Medicare Other | Admitting: Internal Medicine

## 2016-02-23 ENCOUNTER — Encounter: Payer: Self-pay | Admitting: Internal Medicine

## 2016-02-23 VITALS — BP 150/70 | HR 78 | Temp 97.5°F | Wt 130.0 lb

## 2016-02-23 DIAGNOSIS — F331 Major depressive disorder, recurrent, moderate: Secondary | ICD-10-CM | POA: Diagnosis not present

## 2016-02-23 DIAGNOSIS — I482 Chronic atrial fibrillation: Secondary | ICD-10-CM

## 2016-02-23 DIAGNOSIS — F411 Generalized anxiety disorder: Secondary | ICD-10-CM

## 2016-02-23 DIAGNOSIS — I6200 Nontraumatic subdural hemorrhage, unspecified: Secondary | ICD-10-CM

## 2016-02-23 DIAGNOSIS — G2581 Restless legs syndrome: Secondary | ICD-10-CM

## 2016-02-23 DIAGNOSIS — I4821 Permanent atrial fibrillation: Secondary | ICD-10-CM

## 2016-02-23 DIAGNOSIS — S065XAA Traumatic subdural hemorrhage with loss of consciousness status unknown, initial encounter: Secondary | ICD-10-CM

## 2016-02-23 DIAGNOSIS — Z7409 Other reduced mobility: Secondary | ICD-10-CM

## 2016-02-23 DIAGNOSIS — S065X9A Traumatic subdural hemorrhage with loss of consciousness of unspecified duration, initial encounter: Secondary | ICD-10-CM
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
## 2016-02-27 DIAGNOSIS — F411 Generalized anxiety disorder: Secondary | ICD-10-CM | POA: Insufficient documentation

## 2016-02-27 DIAGNOSIS — G2581 Restless legs syndrome: Secondary | ICD-10-CM | POA: Insufficient documentation

## 2016-02-27 DIAGNOSIS — Z7409 Other reduced mobility: Secondary | ICD-10-CM | POA: Insufficient documentation

## 2016-02-27 DIAGNOSIS — F331 Major depressive disorder, recurrent, moderate: Secondary | ICD-10-CM | POA: Insufficient documentation

## 2016-04-26 ENCOUNTER — Non-Acute Institutional Stay: Payer: Medicare Other | Admitting: Internal Medicine

## 2016-04-26 VITALS — BP 140/70 | Temp 98.1°F

## 2016-04-26 DIAGNOSIS — M545 Low back pain, unspecified: Secondary | ICD-10-CM

## 2016-04-26 DIAGNOSIS — M62838 Other muscle spasm: Secondary | ICD-10-CM | POA: Diagnosis not present

## 2016-04-26 DIAGNOSIS — M810 Age-related osteoporosis without current pathological fracture: Secondary | ICD-10-CM | POA: Diagnosis not present

## 2016-04-27 ENCOUNTER — Encounter: Payer: Self-pay | Admitting: Internal Medicine

## 2016-04-27 ENCOUNTER — Other Ambulatory Visit: Payer: Self-pay | Admitting: Internal Medicine

## 2016-04-27 DIAGNOSIS — M545 Low back pain, unspecified: Secondary | ICD-10-CM | POA: Insufficient documentation

## 2016-04-27 DIAGNOSIS — M818 Other osteoporosis without current pathological fracture: Secondary | ICD-10-CM

## 2016-04-27 DIAGNOSIS — M810 Age-related osteoporosis without current pathological fracture: Secondary | ICD-10-CM

## 2016-04-27 DIAGNOSIS — M62838 Other muscle spasm: Secondary | ICD-10-CM | POA: Insufficient documentation
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
## 2016-05-05 ENCOUNTER — Ambulatory Visit
Admission: RE | Admit: 2016-05-05 | Discharge: 2016-05-05 | Disposition: A | Discharge status: Home / Self Care | Payer: Medicare Other | Source: Ambulatory Visit | Attending: Internal Medicine | Admitting: Internal Medicine

## 2016-05-05 DIAGNOSIS — M810 Age-related osteoporosis without current pathological fracture: Secondary | ICD-10-CM

## 2016-05-09 ENCOUNTER — Encounter: Payer: Self-pay | Admitting: *Deleted

## 2016-05-10 ENCOUNTER — Non-Acute Institutional Stay: Payer: Medicare Other | Admitting: Internal Medicine

## 2016-05-10 ENCOUNTER — Encounter: Payer: Self-pay | Admitting: Internal Medicine

## 2016-05-10 VITALS — BP 158/70 | HR 60 | Temp 97.4°F | Wt 134.0 lb

## 2016-05-10 DIAGNOSIS — M62838 Other muscle spasm: Secondary | ICD-10-CM | POA: Diagnosis not present

## 2016-05-10 DIAGNOSIS — M545 Low back pain, unspecified: Secondary | ICD-10-CM

## 2016-05-10 DIAGNOSIS — G44309 Post-traumatic headache, unspecified, not intractable: Secondary | ICD-10-CM

## 2016-05-10 DIAGNOSIS — K582 Mixed irritable bowel syndrome: Secondary | ICD-10-CM

## 2016-05-10 DIAGNOSIS — T889XXA Complication of surgical and medical care, unspecified, initial encounter: Secondary | ICD-10-CM | POA: Diagnosis not present

## 2016-05-10 DIAGNOSIS — K219 Gastro-esophageal reflux disease without esophagitis: Secondary | ICD-10-CM

## 2016-05-10 DIAGNOSIS — S0990XS Unspecified injury of head, sequela: Secondary | ICD-10-CM

## 2016-05-10 DIAGNOSIS — I10 Essential (primary) hypertension: Secondary | ICD-10-CM | POA: Diagnosis not present
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
## 2016-05-10 NOTE — Patient Instructions (Signed)
Diet for Irritable Bowel Syndrome When you have irritable bowel syndrome (IBS), the foods you eat and your eating habits are very important. IBS may cause various symptoms, such as abdominal pain, constipation, or diarrhea. Choosing the right foods can help ease discomfort caused by these symptoms. Work with your health care provider and dietitian to find the best eating plan to help control your symptoms. What general guidelines do I need to follow?  Keep a food diary. This will help you identify foods that cause symptoms. Write down:  What you eat and when.  What symptoms you have.  When symptoms occur in relation to your meals.  Avoid foods that cause symptoms. Talk with your dietitian about other ways to get the same nutrients that are in these foods.  Eat more foods that contain fiber. Take a fiber supplement if directed by your dietitian.  Eat your meals slowly, in a relaxed setting.  Aim to eat 5-6 small meals per day. Do not skip meals.  Drink enough fluids to keep your urine clear or pale yellow.  Ask your health care provider if you should take an over-the-counter probiotic during flare-ups to help restore healthy gut bacteria.  If you have cramping or diarrhea, try making your meals low in fat and high in carbohydrates. Examples of carbohydrates are pasta, rice, whole grain breads and cereals, fruits, and vegetables.  If dairy products cause your symptoms to flare up, try eating less of them. You might be able to handle yogurt better than other dairy products because it contains bacteria that help with digestion. What foods are not recommended? The following are some foods and drinks that may worsen your symptoms:  Fatty foods, such as French fries.  Milk products, such as cheese or ice cream.  Chocolate.  Alcohol.  Products with caffeine, such as coffee.  Carbonated drinks, such as soda. The items listed above may not be a complete list of foods and beverages to  avoid. Contact your dietitian for more information.  What foods are good sources of fiber? Your health care provider or dietitian may recommend that you eat more foods that contain fiber. Fiber can help reduce constipation and other IBS symptoms. Add foods with fiber to your diet a little at a time so that your body can get used to them. Too much fiber at once might cause gas and swelling of your abdomen. The following are some foods that are good sources of fiber:  Apples.  Peaches.  Pears.  Berries.  Figs.  Broccoli (raw).  Cabbage.  Carrots.  Raw peas.  Kidney beans.  Lima beans.  Whole grain bread.  Whole grain cereal. Where to find more information: International Foundation for Functional Gastrointestinal Disorders: www.iffgd.org National Institute of Diabetes and Digestive and Kidney Diseases: www.niddk.nih.gov/health-information/health-topics/digestive-diseases/ibs/Pages/facts.aspx This information is not intended to replace advice given to you by your health care provider. Make sure you discuss any questions you have with your health care provider. Document Released: 04/01/2003 Document Revised: 06/17/2015 Document Reviewed: 04/11/2013 Elsevier Interactive Patient Education  2017 Elsevier Inc.  

## 2016-05-16 ENCOUNTER — Encounter: Payer: Self-pay | Admitting: Internal Medicine

## 2016-05-16 ENCOUNTER — Ambulatory Visit: Payer: Self-pay | Admitting: Pharmacist

## 2016-06-05 ENCOUNTER — Ambulatory Visit (INDEPENDENT_AMBULATORY_CARE_PROVIDER_SITE_OTHER): Payer: Medicare Other | Admitting: Internal Medicine

## 2016-06-05 ENCOUNTER — Encounter: Payer: Self-pay | Admitting: Internal Medicine

## 2016-06-05 VITALS — BP 136/82 | HR 72 | Ht 63.0 in | Wt 137.4 lb

## 2016-06-05 DIAGNOSIS — I495 Sick sinus syndrome: Secondary | ICD-10-CM | POA: Diagnosis not present

## 2016-06-05 DIAGNOSIS — I482 Chronic atrial fibrillation: Secondary | ICD-10-CM

## 2016-06-05 DIAGNOSIS — I4821 Permanent atrial fibrillation: Secondary | ICD-10-CM

## 2016-06-05 LAB — CUP PACEART INCLINIC DEVICE CHECK
Battery Remaining Longevity: 15 mo
Brady Statistic RV Percent Paced: 55 %
Date Time Interrogation Session: 20180514135301
Implantable Lead Implant Date: 20090107
Implantable Lead Model: 4092
Lead Channel Impedance Value: 406 Ohm
Lead Channel Pacing Threshold Amplitude: 2.25 V
Lead Channel Pacing Threshold Pulse Width: 1 ms
Lead Channel Sensing Intrinsic Amplitude: 11.2 mV
Lead Channel Setting Pacing Pulse Width: 0.76 ms
MDC IDC LEAD LOCATION: 753860
MDC IDC MSMT BATTERY IMPEDANCE: 1724 Ohm
MDC IDC MSMT BATTERY VOLTAGE: 2.72 V
MDC IDC MSMT LEADCHNL RA IMPEDANCE VALUE: 0 Ohm
MDC IDC MSMT LEADCHNL RV PACING THRESHOLD AMPLITUDE: 2.5 V
MDC IDC MSMT LEADCHNL RV PACING THRESHOLD PULSEWIDTH: 0.4 ms
MDC IDC PG IMPLANT DT: 20140730
MDC IDC SET LEADCHNL RV PACING AMPLITUDE: 3.5 V
MDC IDC SET LEADCHNL RV SENSING SENSITIVITY: 4 mV

## 2016-06-05 NOTE — Patient Instructions (Signed)
Medication Instructions:  Your physician recommends that you continue on your current medications as directed. Please refer to the Current Medication list given to you today.   Labwork: None ordered   Testing/Procedures: None ordered   Follow-Up: Your physician wants you to follow-up in: 6 months with Gypsy Balsam, NP    Any Other Special Instructions Will Be Listed Below (If Applicable).     If you need a refill on your cardiac medications before your next appointment, please call your pharmacy.
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
## 2016-06-27 ENCOUNTER — Other Ambulatory Visit: Payer: Self-pay | Admitting: *Deleted

## 2016-06-27 MED ORDER — DIAZEPAM 2 MG PO TABS: 2.0000 mg | ORAL_TABLET | Freq: Every evening | ORAL | 0 refills | Status: DC | PRN

## 2016-06-27 NOTE — Telephone Encounter (Signed)
[facility] #[phone] Fax: [phone]  

## 2016-08-09 ENCOUNTER — Encounter: Payer: Self-pay | Admitting: Internal Medicine

## 2016-08-09 ENCOUNTER — Non-Acute Institutional Stay: Payer: Medicare Other | Admitting: Internal Medicine

## 2016-08-09 VITALS — BP 130/68 | HR 73 | Temp 97.5°F | Wt 138.0 lb

## 2016-08-09 DIAGNOSIS — H543 Unqualified visual loss, both eyes: Secondary | ICD-10-CM | POA: Diagnosis not present

## 2016-08-09 DIAGNOSIS — R1032 Left lower quadrant pain: Secondary | ICD-10-CM | POA: Diagnosis not present

## 2016-08-09 DIAGNOSIS — N952 Postmenopausal atrophic vaginitis: Secondary | ICD-10-CM

## 2016-08-09 DIAGNOSIS — Z0000 Encounter for general adult medical examination without abnormal findings: Secondary | ICD-10-CM | POA: Diagnosis not present
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
## 2016-08-10 LAB — CBC AND DIFFERENTIAL
HCT: 38 (ref 36–46)
Hemoglobin: 12.8 (ref 12.0–16.0)
Platelets: 128 — AB (ref 150–399)
WBC: 6.4

## 2016-08-10 LAB — LIPID PANEL
Cholesterol: 155 (ref 0–200)
HDL: 22 — AB (ref 35–70)
LDL Cholesterol: 78
Triglycerides: 276 — AB (ref 40–160)

## 2016-08-10 LAB — BASIC METABOLIC PANEL
BUN: 37 — AB (ref 4–21)
Creatinine: 2.1 — AB (ref 0.5–1.1)
Glucose: 112
Potassium: 4 (ref 3.4–5.3)
Sodium: 140 (ref 137–147)

## 2016-08-10 LAB — HEPATIC FUNCTION PANEL
ALT: 12 (ref 7–35)
AST: 15 (ref 13–35)
Alkaline Phosphatase: 82 (ref 25–125)
Bilirubin, Total: 0.6

## 2016-08-10 LAB — TSH: TSH: 6 — AB (ref 0.41–5.90)

## 2016-08-15 ENCOUNTER — Encounter: Payer: Self-pay | Admitting: Internal Medicine

## 2016-08-15 MED ORDER — ESTRADIOL 0.1 MG/GM VA CREA: 1.0000 | TOPICAL_CREAM | VAGINAL | 12 refills | Status: DC

## 2016-08-16 LAB — TSH: TSH: 5.77 (ref 0.41–5.90)

## 2016-09-05 ENCOUNTER — Non-Acute Institutional Stay: Payer: Medicare Other | Admitting: Internal Medicine

## 2016-09-05 ENCOUNTER — Encounter: Payer: Self-pay | Admitting: Internal Medicine

## 2016-09-05 DIAGNOSIS — F411 Generalized anxiety disorder: Secondary | ICD-10-CM

## 2016-09-05 DIAGNOSIS — R441 Visual hallucinations: Secondary | ICD-10-CM | POA: Diagnosis not present

## 2016-09-05 DIAGNOSIS — G2581 Restless legs syndrome: Secondary | ICD-10-CM | POA: Diagnosis not present

## 2016-09-05 DIAGNOSIS — H543 Unqualified visual loss, both eyes: Secondary | ICD-10-CM | POA: Diagnosis not present
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
## 2016-09-10 DIAGNOSIS — R441 Visual hallucinations: Secondary | ICD-10-CM | POA: Insufficient documentation

## 2016-09-10 DIAGNOSIS — H543 Unqualified visual loss, both eyes: Secondary | ICD-10-CM | POA: Insufficient documentation

## 2016-10-26 ENCOUNTER — Emergency Department (HOSPITAL_COMMUNITY): Payer: Medicare Other

## 2016-10-26 ENCOUNTER — Emergency Department (HOSPITAL_COMMUNITY)
Admission: EM | Admit: 2016-10-26 | Discharge: 2016-10-26 | Disposition: A | Discharge status: Home / Self Care | Payer: Medicare Other | Attending: Emergency Medicine | Admitting: Emergency Medicine

## 2016-10-26 ENCOUNTER — Encounter (HOSPITAL_COMMUNITY): Payer: Self-pay | Admitting: Emergency Medicine

## 2016-10-26 DIAGNOSIS — Y93E1 Activity, personal bathing and showering: Secondary | ICD-10-CM | POA: Diagnosis not present

## 2016-10-26 DIAGNOSIS — W182XXA Fall in (into) shower or empty bathtub, initial encounter: Secondary | ICD-10-CM | POA: Diagnosis not present

## 2016-10-26 DIAGNOSIS — Z79899 Other long term (current) drug therapy: Secondary | ICD-10-CM | POA: Diagnosis not present

## 2016-10-26 DIAGNOSIS — J45909 Unspecified asthma, uncomplicated: Secondary | ICD-10-CM | POA: Diagnosis not present

## 2016-10-26 DIAGNOSIS — I130 Hypertensive heart and chronic kidney disease with heart failure and stage 1 through stage 4 chronic kidney disease, or unspecified chronic kidney disease: Secondary | ICD-10-CM | POA: Insufficient documentation

## 2016-10-26 DIAGNOSIS — Z7982 Long term (current) use of aspirin: Secondary | ICD-10-CM | POA: Insufficient documentation

## 2016-10-26 DIAGNOSIS — S40012A Contusion of left shoulder, initial encounter: Secondary | ICD-10-CM | POA: Diagnosis not present

## 2016-10-26 DIAGNOSIS — Z950 Presence of cardiac pacemaker: Secondary | ICD-10-CM | POA: Diagnosis not present

## 2016-10-26 DIAGNOSIS — I5032 Chronic diastolic (congestive) heart failure: Secondary | ICD-10-CM | POA: Insufficient documentation

## 2016-10-26 DIAGNOSIS — S0990XA Unspecified injury of head, initial encounter: Secondary | ICD-10-CM | POA: Diagnosis not present

## 2016-10-26 DIAGNOSIS — Y92002 Bathroom of unspecified non-institutional (private) residence single-family (private) house as the place of occurrence of the external cause: Secondary | ICD-10-CM | POA: Insufficient documentation

## 2016-10-26 DIAGNOSIS — Y999 Unspecified external cause status: Secondary | ICD-10-CM | POA: Diagnosis not present

## 2016-10-26 DIAGNOSIS — N184 Chronic kidney disease, stage 4 (severe): Secondary | ICD-10-CM | POA: Insufficient documentation

## 2016-10-26 DIAGNOSIS — I2510 Atherosclerotic heart disease of native coronary artery without angina pectoris: Secondary | ICD-10-CM | POA: Diagnosis not present

## 2016-10-26 LAB — URINALYSIS, ROUTINE W REFLEX MICROSCOPIC
Bacteria, UA: NONE SEEN
Bilirubin Urine: NEGATIVE
GLUCOSE, UA: NEGATIVE mg/dL
Hgb urine dipstick: NEGATIVE
KETONES UR: NEGATIVE mg/dL
Nitrite: NEGATIVE
PH: 6 (ref 5.0–8.0)
Protein, ur: 100 mg/dL — AB
Specific Gravity, Urine: 1.006 (ref 1.005–1.030)
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
## 2016-10-26 NOTE — Discharge Instructions (Signed)
CT scan today shows no signs of bleeding around the brain. You do have some arthritis in your neck. Urine analysis with no signs of infection. We have sent culture so if it comes back abnormal we will contact you. Follow up with your doctor as needed.
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
## 2016-10-26 NOTE — ED Notes (Signed)
Spoke to SW and they are aware of discharge instructions and patient return. Per SW they preferred for their team to come transport patient versus [facility] due to delay. [facility] will come to transport patient.

## 2016-10-26 NOTE — ED Notes (Signed)
Patient transported to CT 

## 2016-10-26 NOTE — ED Notes (Signed)
[facility] arrived to transport patient to facility. Discharge packet and DNR folder given to him. Patient ambulatory and A7O x4.

## 2016-10-27 LAB — URINE CULTURE: CULTURE: NO GROWTH

## 2016-11-07 LAB — BASIC METABOLIC PANEL
BUN: 17 (ref 4–21)
Creatinine: 2.2 — AB (ref 0.5–1.1)
Glucose: 94
POTASSIUM: 3.8 (ref 3.4–5.3)
SODIUM: 137 (ref 137–147)

## 2016-11-07 LAB — HEPATIC FUNCTION PANEL
ALT: 12 (ref 7–35)
AST: 17 (ref 13–35)
Alkaline Phosphatase: 102 (ref 25–125)
BILIRUBIN, TOTAL: 0.7

## 2016-11-14 ENCOUNTER — Encounter: Payer: Self-pay | Admitting: Nurse Practitioner

## 2016-11-15 ENCOUNTER — Non-Acute Institutional Stay: Payer: Medicare Other | Admitting: Internal Medicine

## 2016-11-15 ENCOUNTER — Encounter: Payer: Self-pay | Admitting: Internal Medicine

## 2016-11-15 VITALS — BP 138/78 | HR 79 | Temp 98.4°F | Wt 146.0 lb

## 2016-11-15 DIAGNOSIS — I5032 Chronic diastolic (congestive) heart failure: Secondary | ICD-10-CM | POA: Diagnosis not present

## 2016-11-15 DIAGNOSIS — S065XAA Traumatic subdural hemorrhage with loss of consciousness status unknown, initial encounter: Secondary | ICD-10-CM

## 2016-11-15 DIAGNOSIS — N184 Chronic kidney disease, stage 4 (severe): Secondary | ICD-10-CM | POA: Diagnosis not present

## 2016-11-15 DIAGNOSIS — Z9181 History of falling: Secondary | ICD-10-CM | POA: Diagnosis not present

## 2016-11-15 DIAGNOSIS — S065X9A Traumatic subdural hemorrhage with loss of consciousness of unspecified duration, initial encounter: Secondary | ICD-10-CM | POA: Diagnosis not present

## 2016-11-15 DIAGNOSIS — H04123 Dry eye syndrome of bilateral lacrimal glands: Secondary | ICD-10-CM | POA: Diagnosis not present

## 2016-11-15 DIAGNOSIS — N368 Other specified disorders of urethra: Secondary | ICD-10-CM | POA: Diagnosis not present
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
## 2016-11-30 ENCOUNTER — Ambulatory Visit (INDEPENDENT_AMBULATORY_CARE_PROVIDER_SITE_OTHER): Payer: Medicare Other | Admitting: Nurse Practitioner

## 2016-11-30 ENCOUNTER — Encounter: Payer: Self-pay | Admitting: Nurse Practitioner

## 2016-11-30 ENCOUNTER — Encounter (INDEPENDENT_AMBULATORY_CARE_PROVIDER_SITE_OTHER): Payer: Self-pay

## 2016-11-30 VITALS — BP 163/83 | HR 70 | Ht 63.0 in | Wt 129.4 lb

## 2016-11-30 DIAGNOSIS — I5032 Chronic diastolic (congestive) heart failure: Secondary | ICD-10-CM

## 2016-11-30 DIAGNOSIS — I482 Chronic atrial fibrillation: Secondary | ICD-10-CM

## 2016-11-30 DIAGNOSIS — I4821 Permanent atrial fibrillation: Secondary | ICD-10-CM

## 2016-11-30 DIAGNOSIS — I495 Sick sinus syndrome: Secondary | ICD-10-CM | POA: Diagnosis not present

## 2016-11-30 LAB — CUP PACEART INCLINIC DEVICE CHECK
Date Time Interrogation Session: 20181108114011
Implantable Lead Implant Date: 20090107
Implantable Lead Location: 753860
Implantable Lead Model: 4092
MDC IDC PG IMPLANT DT: 20140730
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
## 2016-11-30 NOTE — Patient Instructions (Addendum)
Medication Instructions:   Your physician recommends that you continue on your current medications as directed. Please refer to the Current Medication list given to you today.   If you need a refill on your cardiac medications before your next appointment, please call your pharmacy.  Labwork: NONE ORDERED  TODAY    Testing/Procedures: NONE ORDERED  TODAY'   Follow-Up:  Your physician wants you to follow-up in:  IN  6  MONTHS WITH  SEILER  You will receive a reminder letter in the mail two months in advance. If you don't receive a letter, please call our office to schedule the follow-up appointment.   DR Jens Som FIRST AVAILABLE   Any Other Special Instructions Will Be Listed Below (If Applicable).

## 2016-12-01 ENCOUNTER — Encounter: Payer: Medicare Other | Admitting: Nurse Practitioner

## 2016-12-04 ENCOUNTER — Telehealth: Payer: Self-pay | Admitting: Cardiology

## 2016-12-04 NOTE — Telephone Encounter (Signed)
Spoke with patient regarding her SOB and Lasix.  She wants to see her PCP at [facility] and request a chest X-RAY.Marland Kitchen  She will get back to [place] if need be.

## 2016-12-04 NOTE — Telephone Encounter (Signed)
Returned call to [employer] with [facility].She stated Dr.Detering did not want pt to take Zaroxolyn.Stated if Dr.Allred wants patient to increase Lasix he will need to fax order to her at fax # [phone].Message sent to Gypsy Balsam NP for advice.

## 2016-12-04 NOTE — Telephone Encounter (Signed)
New message  Tracey [name] verbalized that she is calling for the rn   To go over the medication zaroxyln 2.5mg  and if pt should take it or not because Dr.Deterding do not want pt to take it  He want her laxis increased instead of starting that medication   Please fax order to [phone]

## 2016-12-05 ENCOUNTER — Encounter: Payer: Self-pay | Admitting: Internal Medicine

## 2016-12-05 ENCOUNTER — Non-Acute Institutional Stay: Payer: Medicare Other | Admitting: Internal Medicine

## 2016-12-05 DIAGNOSIS — R0981 Nasal congestion: Secondary | ICD-10-CM | POA: Diagnosis not present

## 2016-12-05 DIAGNOSIS — N184 Chronic kidney disease, stage 4 (severe): Secondary | ICD-10-CM

## 2016-12-05 DIAGNOSIS — I5033 Acute on chronic diastolic (congestive) heart failure: Secondary | ICD-10-CM | POA: Diagnosis not present

## 2016-12-05 DIAGNOSIS — F419 Anxiety disorder, unspecified: Secondary | ICD-10-CM | POA: Diagnosis not present

## 2016-12-05 LAB — CBC AND DIFFERENTIAL
HCT: 41 (ref 36–46)
Hemoglobin: 13.5 (ref 12.0–16.0)
Platelets: 148 — AB (ref 150–399)
WBC: 6

## 2016-12-05 LAB — BASIC METABOLIC PANEL
BUN: 35 — AB (ref 4–21)
Creatinine: 1.8 — AB (ref 0.5–1.1)
Glucose: 121
Potassium: 4 (ref 3.4–5.3)
Sodium: 137 (ref 137–147)
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
## 2016-12-06 ENCOUNTER — Encounter: Payer: Self-pay | Admitting: Internal Medicine

## 2016-12-09 LAB — BASIC METABOLIC PANEL
BUN: 40 — AB (ref 4–21)
Creatinine: 1.9 — AB (ref 0.5–1.1)
Glucose: 109
Potassium: 4 (ref 3.4–5.3)
Sodium: 138 (ref 137–147)

## 2016-12-11 LAB — BASIC METABOLIC PANEL
BUN: 41 — AB (ref 4–21)
Creatinine: 2.3 — AB (ref 0.5–1.1)
Glucose: 101
Potassium: 4.3 (ref 3.4–5.3)
Sodium: 141 (ref 137–147)

## 2016-12-12 ENCOUNTER — Encounter: Payer: Self-pay | Admitting: Internal Medicine
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
## 2017-02-26 ENCOUNTER — Encounter: Payer: Self-pay | Admitting: Cardiology

## 2017-02-26 ENCOUNTER — Ambulatory Visit: Payer: Medicare Other | Admitting: Cardiology

## 2017-02-26 VITALS — BP 153/78 | HR 75 | Ht 63.0 in | Wt 140.2 lb

## 2017-02-26 DIAGNOSIS — I5032 Chronic diastolic (congestive) heart failure: Secondary | ICD-10-CM | POA: Diagnosis not present

## 2017-02-26 DIAGNOSIS — I4821 Permanent atrial fibrillation: Secondary | ICD-10-CM

## 2017-02-26 DIAGNOSIS — I482 Chronic atrial fibrillation: Secondary | ICD-10-CM

## 2017-02-26 DIAGNOSIS — I10 Essential (primary) hypertension: Secondary | ICD-10-CM | POA: Diagnosis not present

## 2017-02-26 NOTE — Patient Instructions (Signed)
Medication Instructions:   INCREASE FUROSEMIDE TO 240 MG IN THE AM AND 160 MG IN THE PM FOR 3 DAYS THEN GO BACK TO 160 MG BID  INCREASE METOPROLOL TO 175 MG ONCE DAILY  Labwork:  Your physician recommends that you return for lab work in: ONE [facility] [place]  Follow-Up:  Your physician wants you to follow-up in: 6 MONTHS WITH DR Jens Som You will receive a reminder letter in the mail two months in advance. If you don't receive a letter, please call our office to schedule the follow-up appointment.

## 2017-03-05 LAB — BASIC METABOLIC PANEL
BUN: 36 — AB (ref 4–21)
Creatinine: 2 — AB (ref 0.5–1.1)
GLUCOSE: 188
POTASSIUM: 4 (ref 3.4–5.3)
SODIUM: 139 (ref 137–147)

## 2017-03-06 ENCOUNTER — Encounter: Payer: Self-pay | Admitting: Internal Medicine

## 2017-03-14 ENCOUNTER — Encounter: Payer: Self-pay | Admitting: Internal Medicine

## 2017-03-14 ENCOUNTER — Non-Acute Institutional Stay: Payer: Medicare Other | Admitting: Internal Medicine

## 2017-03-14 VITALS — BP 140/80 | HR 71 | Temp 97.0°F | Wt 138.0 lb

## 2017-03-14 DIAGNOSIS — I4819 Other persistent atrial fibrillation: Secondary | ICD-10-CM

## 2017-03-14 DIAGNOSIS — I5032 Chronic diastolic (congestive) heart failure: Secondary | ICD-10-CM

## 2017-03-14 DIAGNOSIS — R531 Weakness: Secondary | ICD-10-CM | POA: Diagnosis not present

## 2017-03-14 DIAGNOSIS — B9789 Other viral agents as the cause of diseases classified elsewhere: Secondary | ICD-10-CM

## 2017-03-14 DIAGNOSIS — N184 Chronic kidney disease, stage 4 (severe): Secondary | ICD-10-CM | POA: Diagnosis not present

## 2017-03-14 DIAGNOSIS — J069 Acute upper respiratory infection, unspecified: Secondary | ICD-10-CM | POA: Diagnosis not present

## 2017-03-14 DIAGNOSIS — J330 Polyp of nasal cavity: Secondary | ICD-10-CM

## 2017-03-14 DIAGNOSIS — I481 Persistent atrial fibrillation: Secondary | ICD-10-CM | POA: Diagnosis not present

## 2017-03-14 DIAGNOSIS — J3089 Other allergic rhinitis: Secondary | ICD-10-CM

## 2017-03-14 MED ORDER — GUAIFENESIN ER 600 MG PO TB12: 600.0000 mg | ORAL_TABLET | Freq: Two times a day (BID) | ORAL | 0 refills | Status: AC

## 2017-03-14 MED ORDER — CETIRIZINE HCL 10 MG PO TABS: 10.0000 mg | ORAL_TABLET | Freq: Every day | ORAL | 11 refills | Status: DC
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
## 2017-03-21 ENCOUNTER — Encounter: Payer: Self-pay | Admitting: Internal Medicine

## 2017-03-21 ENCOUNTER — Non-Acute Institutional Stay: Payer: Medicare Other | Admitting: Internal Medicine

## 2017-03-21 VITALS — BP 136/80 | HR 68 | Temp 97.7°F | Wt 135.0 lb

## 2017-03-21 DIAGNOSIS — G2581 Restless legs syndrome: Secondary | ICD-10-CM

## 2017-03-21 DIAGNOSIS — F331 Major depressive disorder, recurrent, moderate: Secondary | ICD-10-CM | POA: Diagnosis not present

## 2017-03-21 DIAGNOSIS — K219 Gastro-esophageal reflux disease without esophagitis: Secondary | ICD-10-CM | POA: Diagnosis not present

## 2017-03-21 DIAGNOSIS — G4719 Other hypersomnia: Secondary | ICD-10-CM

## 2017-03-21 DIAGNOSIS — J330 Polyp of nasal cavity: Secondary | ICD-10-CM | POA: Diagnosis not present

## 2017-03-21 DIAGNOSIS — H353290 Exudative age-related macular degeneration, unspecified eye, stage unspecified: Secondary | ICD-10-CM | POA: Diagnosis not present

## 2017-03-21 DIAGNOSIS — J3089 Other allergic rhinitis: Secondary | ICD-10-CM

## 2017-03-21 MED ORDER — DULOXETINE HCL 60 MG PO CPEP: 60.0000 mg | ORAL_CAPSULE | Freq: Every day | ORAL | 3 refills | Status: DC
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
## 2017-04-12 ENCOUNTER — Encounter: Payer: Self-pay | Admitting: Nurse Practitioner

## 2017-04-12 ENCOUNTER — Ambulatory Visit: Payer: Medicare Other | Admitting: Nurse Practitioner

## 2017-04-12 VITALS — BP 132/76 | HR 72 | Ht 63.0 in | Wt 136.0 lb

## 2017-04-12 DIAGNOSIS — I5032 Chronic diastolic (congestive) heart failure: Secondary | ICD-10-CM

## 2017-04-12 DIAGNOSIS — I482 Chronic atrial fibrillation: Secondary | ICD-10-CM

## 2017-04-12 DIAGNOSIS — I4821 Permanent atrial fibrillation: Secondary | ICD-10-CM

## 2017-04-12 DIAGNOSIS — I495 Sick sinus syndrome: Secondary | ICD-10-CM | POA: Diagnosis not present

## 2017-04-12 NOTE — Patient Instructions (Addendum)
Medication Instructions:   Your physician recommends that you continue on your current medications as directed. Please refer to the Current Medication list given to you today.    If you need a refill on your cardiac medications before your next appointment, please call your pharmacy.  Labwork: NONE ORDERED  TODAY    Testing/Procedures: NONE ORDERED  TODAY    Follow-Up:  Your physician wants you to follow-up in:  IN  6 MONTHS WITH DR ALLRED   You will receive a reminder letter in the mail two months in advance. If you don't receive a letter, please call our office to schedule the follow-up appointment.      Any Other Special Instructions Will Be Listed Below (If Applicable).                                                                                                                                                   

## 2017-04-16 LAB — CUP PACEART INCLINIC DEVICE CHECK
Date Time Interrogation Session: 20190325102317
Implantable Lead Location: 753860
Implantable Lead Model: 4092
Implantable Pulse Generator Implant Date: 20140730
MDC IDC LEAD IMPLANT DT: 20090107

## 2017-04-18 ENCOUNTER — Encounter: Payer: Self-pay | Admitting: Internal Medicine

## 2017-04-18 ENCOUNTER — Non-Acute Institutional Stay: Payer: Medicare Other | Admitting: Internal Medicine

## 2017-04-18 VITALS — BP 130/60 | HR 70 | Temp 97.4°F | Ht 63.0 in | Wt 136.0 lb

## 2017-04-18 DIAGNOSIS — N184 Chronic kidney disease, stage 4 (severe): Secondary | ICD-10-CM

## 2017-04-18 DIAGNOSIS — R400 Somnolence: Secondary | ICD-10-CM | POA: Diagnosis not present

## 2017-04-18 DIAGNOSIS — J3089 Other allergic rhinitis: Secondary | ICD-10-CM | POA: Diagnosis not present

## 2017-04-18 DIAGNOSIS — I481 Persistent atrial fibrillation: Secondary | ICD-10-CM | POA: Diagnosis not present

## 2017-04-18 DIAGNOSIS — R531 Weakness: Secondary | ICD-10-CM | POA: Diagnosis not present

## 2017-04-18 DIAGNOSIS — I4819 Other persistent atrial fibrillation: Secondary | ICD-10-CM

## 2017-04-18 DIAGNOSIS — R630 Anorexia: Secondary | ICD-10-CM | POA: Diagnosis not present

## 2017-04-18 DIAGNOSIS — I5032 Chronic diastolic (congestive) heart failure: Secondary | ICD-10-CM

## 2017-04-18 MED ORDER — DULOXETINE HCL 60 MG PO CPEP: 60.0000 mg | ORAL_CAPSULE | Freq: Every day | ORAL | 3 refills | Status: AC
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
## 2017-05-23 ENCOUNTER — Ambulatory Visit: Payer: Medicare Other | Admitting: Internal Medicine

## 2017-05-30 ENCOUNTER — Non-Acute Institutional Stay: Payer: Medicare Other | Admitting: Internal Medicine

## 2017-05-30 ENCOUNTER — Encounter: Payer: Self-pay | Admitting: Internal Medicine

## 2017-05-30 VITALS — BP 138/80 | HR 68 | Temp 98.4°F | Ht 63.0 in | Wt 134.0 lb

## 2017-05-30 DIAGNOSIS — R531 Weakness: Secondary | ICD-10-CM

## 2017-05-30 DIAGNOSIS — R0609 Other forms of dyspnea: Secondary | ICD-10-CM

## 2017-05-30 DIAGNOSIS — I5032 Chronic diastolic (congestive) heart failure: Secondary | ICD-10-CM | POA: Diagnosis not present

## 2017-05-30 DIAGNOSIS — R0601 Orthopnea: Secondary | ICD-10-CM | POA: Diagnosis not present

## 2017-05-30 DIAGNOSIS — J309 Allergic rhinitis, unspecified: Secondary | ICD-10-CM | POA: Diagnosis not present

## 2017-05-30 DIAGNOSIS — R0600 Dyspnea, unspecified: Secondary | ICD-10-CM

## 2017-05-30 DIAGNOSIS — R0982 Postnasal drip: Secondary | ICD-10-CM

## 2017-05-30 DIAGNOSIS — N184 Chronic kidney disease, stage 4 (severe): Secondary | ICD-10-CM | POA: Diagnosis not present

## 2017-05-30 DIAGNOSIS — R630 Anorexia: Secondary | ICD-10-CM
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
## 2017-05-31 LAB — CBC AND DIFFERENTIAL
HCT: 36 (ref 36–46)
Hemoglobin: 11.4 — AB (ref 12.0–16.0)
Platelets: 138 — AB (ref 150–399)
WBC: 4.9

## 2017-05-31 LAB — BASIC METABOLIC PANEL
BUN: 39 — AB (ref 4–21)
Creatinine: 2.1 — AB (ref 0.5–1.1)
Glucose: 98
Potassium: 4.3 (ref 3.4–5.3)
Sodium: 140 (ref 137–147)

## 2017-06-20 ENCOUNTER — Non-Acute Institutional Stay: Payer: Medicare Other | Admitting: Internal Medicine

## 2017-06-20 ENCOUNTER — Encounter: Payer: Self-pay | Admitting: Internal Medicine

## 2017-06-20 VITALS — BP 118/68 | HR 90 | Temp 97.6°F | Ht 63.0 in | Wt 133.0 lb

## 2017-06-20 DIAGNOSIS — R54 Age-related physical debility: Secondary | ICD-10-CM | POA: Diagnosis not present

## 2017-06-20 DIAGNOSIS — F339 Major depressive disorder, recurrent, unspecified: Secondary | ICD-10-CM

## 2017-06-20 DIAGNOSIS — J309 Allergic rhinitis, unspecified: Secondary | ICD-10-CM

## 2017-06-20 DIAGNOSIS — R531 Weakness: Secondary | ICD-10-CM

## 2017-06-20 DIAGNOSIS — R062 Wheezing: Secondary | ICD-10-CM | POA: Diagnosis not present

## 2017-06-20 DIAGNOSIS — H353290 Exudative age-related macular degeneration, unspecified eye, stage unspecified: Secondary | ICD-10-CM

## 2017-06-20 DIAGNOSIS — R0982 Postnasal drip: Secondary | ICD-10-CM | POA: Diagnosis not present

## 2017-06-20 DIAGNOSIS — K582 Mixed irritable bowel syndrome: Secondary | ICD-10-CM
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
## 2017-06-23 DIAGNOSIS — I482 Chronic atrial fibrillation: Secondary | ICD-10-CM | POA: Diagnosis not present

## 2017-06-23 DIAGNOSIS — I2510 Atherosclerotic heart disease of native coronary artery without angina pectoris: Secondary | ICD-10-CM | POA: Diagnosis not present

## 2017-06-23 DIAGNOSIS — I5033 Acute on chronic diastolic (congestive) heart failure: Secondary | ICD-10-CM | POA: Diagnosis not present

## 2017-06-23 DIAGNOSIS — M19041 Primary osteoarthritis, right hand: Secondary | ICD-10-CM | POA: Diagnosis not present

## 2017-06-23 DIAGNOSIS — I5022 Chronic systolic (congestive) heart failure: Secondary | ICD-10-CM | POA: Diagnosis not present

## 2017-06-23 DIAGNOSIS — M19042 Primary osteoarthritis, left hand: Secondary | ICD-10-CM | POA: Diagnosis not present

## 2017-06-23 DIAGNOSIS — N184 Chronic kidney disease, stage 4 (severe): Secondary | ICD-10-CM | POA: Diagnosis not present

## 2017-06-23 DIAGNOSIS — I130 Hypertensive heart and chronic kidney disease with heart failure and stage 1 through stage 4 chronic kidney disease, or unspecified chronic kidney disease: Secondary | ICD-10-CM | POA: Diagnosis not present

## 2017-06-25 ENCOUNTER — Telehealth: Payer: Self-pay | Admitting: *Deleted

## 2017-06-25 NOTE — Telephone Encounter (Signed)
Tracey [name] with [name] called requesting verbal order for Plan of Care for Home Care. Faxing Plan of Care.

## 2017-07-19 ENCOUNTER — Encounter: Payer: Self-pay | Admitting: Nurse Practitioner

## 2017-07-24 ENCOUNTER — Non-Acute Institutional Stay (SKILLED_NURSING_FACILITY): Payer: Medicare Other | Admitting: Internal Medicine

## 2017-07-24 ENCOUNTER — Encounter: Payer: Self-pay | Admitting: Internal Medicine

## 2017-07-24 DIAGNOSIS — N184 Chronic kidney disease, stage 4 (severe): Secondary | ICD-10-CM | POA: Diagnosis not present

## 2017-07-24 DIAGNOSIS — F339 Major depressive disorder, recurrent, unspecified: Secondary | ICD-10-CM

## 2017-07-24 DIAGNOSIS — R531 Weakness: Secondary | ICD-10-CM | POA: Diagnosis not present

## 2017-07-24 DIAGNOSIS — H353290 Exudative age-related macular degeneration, unspecified eye, stage unspecified: Secondary | ICD-10-CM | POA: Diagnosis not present

## 2017-07-24 DIAGNOSIS — I5032 Chronic diastolic (congestive) heart failure: Secondary | ICD-10-CM

## 2017-07-24 DIAGNOSIS — I481 Persistent atrial fibrillation: Secondary | ICD-10-CM | POA: Diagnosis not present

## 2017-07-24 DIAGNOSIS — K582 Mixed irritable bowel syndrome: Secondary | ICD-10-CM | POA: Diagnosis not present

## 2017-07-24 DIAGNOSIS — I4819 Other persistent atrial fibrillation: Secondary | ICD-10-CM

## 2017-07-24 DIAGNOSIS — R54 Age-related physical debility: Secondary | ICD-10-CM | POA: Diagnosis not present
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
## 2017-07-26 ENCOUNTER — Emergency Department (HOSPITAL_COMMUNITY): Payer: Medicare Other

## 2017-07-26 ENCOUNTER — Encounter (HOSPITAL_COMMUNITY): Payer: Self-pay

## 2017-07-26 ENCOUNTER — Other Ambulatory Visit: Payer: Self-pay

## 2017-07-26 DIAGNOSIS — Z8249 Family history of ischemic heart disease and other diseases of the circulatory system: Secondary | ICD-10-CM

## 2017-07-26 DIAGNOSIS — I482 Chronic atrial fibrillation, unspecified: Secondary | ICD-10-CM | POA: Diagnosis present

## 2017-07-26 DIAGNOSIS — I5033 Acute on chronic diastolic (congestive) heart failure: Secondary | ICD-10-CM | POA: Diagnosis present

## 2017-07-26 DIAGNOSIS — I671 Cerebral aneurysm, nonruptured: Secondary | ICD-10-CM | POA: Diagnosis present

## 2017-07-26 DIAGNOSIS — F411 Generalized anxiety disorder: Secondary | ICD-10-CM | POA: Diagnosis present

## 2017-07-26 DIAGNOSIS — E785 Hyperlipidemia, unspecified: Secondary | ICD-10-CM | POA: Diagnosis present

## 2017-07-26 DIAGNOSIS — D631 Anemia in chronic kidney disease: Secondary | ICD-10-CM | POA: Diagnosis present

## 2017-07-26 DIAGNOSIS — H3530 Unspecified macular degeneration: Secondary | ICD-10-CM | POA: Diagnosis present

## 2017-07-26 DIAGNOSIS — I5082 Biventricular heart failure: Secondary | ICD-10-CM | POA: Diagnosis present

## 2017-07-26 DIAGNOSIS — E872 Acidosis, unspecified: Secondary | ICD-10-CM | POA: Diagnosis present

## 2017-07-26 DIAGNOSIS — Z90710 Acquired absence of both cervix and uterus: Secondary | ICD-10-CM

## 2017-07-26 DIAGNOSIS — F329 Major depressive disorder, single episode, unspecified: Secondary | ICD-10-CM | POA: Diagnosis present

## 2017-07-26 DIAGNOSIS — Z955 Presence of coronary angioplasty implant and graft: Secondary | ICD-10-CM

## 2017-07-26 DIAGNOSIS — J9601 Acute respiratory failure with hypoxia: Secondary | ICD-10-CM | POA: Diagnosis not present

## 2017-07-26 DIAGNOSIS — E875 Hyperkalemia: Secondary | ICD-10-CM | POA: Diagnosis present

## 2017-07-26 DIAGNOSIS — I10 Essential (primary) hypertension: Secondary | ICD-10-CM | POA: Diagnosis not present

## 2017-07-26 DIAGNOSIS — I071 Rheumatic tricuspid insufficiency: Secondary | ICD-10-CM | POA: Diagnosis present

## 2017-07-26 DIAGNOSIS — Z66 Do not resuscitate: Secondary | ICD-10-CM | POA: Diagnosis present

## 2017-07-26 DIAGNOSIS — N184 Chronic kidney disease, stage 4 (severe): Secondary | ICD-10-CM | POA: Diagnosis present

## 2017-07-26 DIAGNOSIS — Z950 Presence of cardiac pacemaker: Secondary | ICD-10-CM

## 2017-07-26 DIAGNOSIS — I509 Heart failure, unspecified: Secondary | ICD-10-CM

## 2017-07-26 DIAGNOSIS — I2510 Atherosclerotic heart disease of native coronary artery without angina pectoris: Secondary | ICD-10-CM | POA: Diagnosis present

## 2017-07-26 DIAGNOSIS — N179 Acute kidney failure, unspecified: Secondary | ICD-10-CM | POA: Diagnosis present

## 2017-07-26 DIAGNOSIS — F410 Panic disorder [episodic paroxysmal anxiety] without agoraphobia: Secondary | ICD-10-CM | POA: Diagnosis present

## 2017-07-26 DIAGNOSIS — M109 Gout, unspecified: Secondary | ICD-10-CM | POA: Diagnosis present

## 2017-07-26 DIAGNOSIS — J9621 Acute and chronic respiratory failure with hypoxia: Secondary | ICD-10-CM | POA: Diagnosis present

## 2017-07-26 DIAGNOSIS — Z7189 Other specified counseling: Secondary | ICD-10-CM | POA: Diagnosis not present

## 2017-07-26 DIAGNOSIS — I2729 Other secondary pulmonary hypertension: Secondary | ICD-10-CM | POA: Diagnosis present

## 2017-07-26 DIAGNOSIS — I219 Acute myocardial infarction, unspecified: Secondary | ICD-10-CM | POA: Diagnosis present

## 2017-07-26 DIAGNOSIS — K219 Gastro-esophageal reflux disease without esophagitis: Secondary | ICD-10-CM | POA: Diagnosis present

## 2017-07-26 DIAGNOSIS — H5462 Unqualified visual loss, left eye, normal vision right eye: Secondary | ICD-10-CM | POA: Diagnosis present

## 2017-07-26 DIAGNOSIS — I351 Nonrheumatic aortic (valve) insufficiency: Secondary | ICD-10-CM | POA: Diagnosis present

## 2017-07-26 DIAGNOSIS — G9341 Metabolic encephalopathy: Secondary | ICD-10-CM | POA: Diagnosis present

## 2017-07-26 DIAGNOSIS — I130 Hypertensive heart and chronic kidney disease with heart failure and stage 1 through stage 4 chronic kidney disease, or unspecified chronic kidney disease: Secondary | ICD-10-CM | POA: Diagnosis not present

## 2017-07-26 DIAGNOSIS — Z8673 Personal history of transient ischemic attack (TIA), and cerebral infarction without residual deficits: Secondary | ICD-10-CM

## 2017-07-26 DIAGNOSIS — G2581 Restless legs syndrome: Secondary | ICD-10-CM | POA: Diagnosis present

## 2017-07-26 DIAGNOSIS — Z515 Encounter for palliative care: Secondary | ICD-10-CM | POA: Diagnosis present

## 2017-07-26 DIAGNOSIS — Z7982 Long term (current) use of aspirin: Secondary | ICD-10-CM

## 2017-07-26 DIAGNOSIS — Z79899 Other long term (current) drug therapy: Secondary | ICD-10-CM

## 2017-07-26 DIAGNOSIS — I495 Sick sinus syndrome: Secondary | ICD-10-CM | POA: Diagnosis present

## 2017-07-26 DIAGNOSIS — I340 Nonrheumatic mitral (valve) insufficiency: Secondary | ICD-10-CM | POA: Diagnosis not present

## 2017-07-26 DIAGNOSIS — Z9981 Dependence on supplemental oxygen: Secondary | ICD-10-CM | POA: Diagnosis not present

## 2017-07-26 DIAGNOSIS — I5043 Acute on chronic combined systolic (congestive) and diastolic (congestive) heart failure: Secondary | ICD-10-CM | POA: Diagnosis present

## 2017-07-26 DIAGNOSIS — I959 Hypotension, unspecified: Secondary | ICD-10-CM | POA: Diagnosis present

## 2017-07-26 LAB — COMPREHENSIVE METABOLIC PANEL
ALK PHOS: 94 U/L (ref 38–126)
ALT: 12 U/L (ref 0–44)
AST: 34 U/L (ref 15–41)
Albumin: 4 g/dL (ref 3.5–5.0)
Anion gap: 12 (ref 5–15)
BUN: 46 mg/dL — ABNORMAL HIGH (ref 8–23)
CO2: 21 mmol/L — AB (ref 22–32)
CREATININE: 3.21 mg/dL — AB (ref 0.44–1.00)
Calcium: 10.2 mg/dL (ref 8.9–10.3)
Chloride: 103 mmol/L (ref 98–111)
GFR calc Af Amer: 13 mL/min — ABNORMAL LOW (ref >60)
GFR calc non Af Amer: 12 mL/min — ABNORMAL LOW (ref >60)
GLUCOSE: 147 mg/dL — AB (ref 70–99)
Potassium: >7.5 mmol/L (ref 3.5–5.1)
SODIUM: 136 mmol/L (ref 135–145)
Total Bilirubin: 1.4 mg/dL — ABNORMAL HIGH (ref 0.3–1.2)
Total Protein: 7 g/dL (ref 6.5–8.1)

## 2017-07-26 LAB — CBC WITH DIFFERENTIAL/PLATELET
Abs Immature Granulocytes: 0.1 10*3/uL (ref 0.0–0.1)
Basophils Absolute: 0 10*3/uL (ref 0.0–0.1)
Basophils Relative: 1 %
EOS ABS: 0 10*3/uL (ref 0.0–0.7)
EOS PCT: 1 %
HCT: 47 % — ABNORMAL HIGH (ref 36.0–46.0)
Hemoglobin: 14.2 g/dL (ref 12.0–15.0)
Immature Granulocytes: 1 %
Lymphocytes Relative: 57 %
Lymphs Abs: 2.6 10*3/uL (ref 0.7–4.0)
MCH: 31.8 pg (ref 26.0–34.0)
MCHC: 30.2 g/dL (ref 30.0–36.0)
MCV: 105.1 fL — AB (ref 78.0–100.0)
MONO ABS: 0.2 10*3/uL (ref 0.1–1.0)
MONOS PCT: 5 %
Neutro Abs: 1.6 10*3/uL — ABNORMAL LOW (ref 1.7–7.7)
Neutrophils Relative %: 35 %
PLATELETS: 160 10*3/uL (ref 150–400)
RBC: 4.47 MIL/uL (ref 3.87–5.11)
RDW: 18.4 % — AB (ref 11.5–15.5)
WBC: 4.4 10*3/uL (ref 4.0–10.5)

## 2017-07-26 LAB — URINALYSIS, ROUTINE W REFLEX MICROSCOPIC
BILIRUBIN URINE: NEGATIVE
Glucose, UA: NEGATIVE mg/dL
HGB URINE DIPSTICK: NEGATIVE
KETONES UR: NEGATIVE mg/dL
Nitrite: NEGATIVE
Protein, ur: 30 mg/dL — AB
Specific Gravity, Urine: 1.011 (ref 1.005–1.030)
pH: 5 (ref 5.0–8.0)

## 2017-07-26 LAB — I-STAT CG4 LACTIC ACID, ED
LACTIC ACID, VENOUS: 4.38 mmol/L — AB (ref 0.5–1.9)
Lactic Acid, Venous: 3.43 mmol/L (ref 0.5–1.9)

## 2017-07-26 LAB — I-STAT VENOUS BLOOD GAS, ED
Acid-base deficit: 4 mmol/L — ABNORMAL HIGH (ref 0.0–2.0)
Bicarbonate: 21.3 mmol/L (ref 20.0–28.0)
O2 SAT: 72 %
PCO2 VEN: 40.1 mmHg — AB (ref 44.0–60.0)
PO2 VEN: 40 mmHg (ref 32.0–45.0)
TCO2: 22 mmol/L (ref 22–32)
pH, Ven: 7.333 (ref 7.250–7.430)

## 2017-07-26 LAB — POTASSIUM: Potassium: 7.4 mmol/L (ref 3.5–5.1)

## 2017-07-26 LAB — I-STAT TROPONIN, ED: Troponin i, poc: 0.01 ng/mL (ref 0.00–0.08)

## 2017-07-26 LAB — BRAIN NATRIURETIC PEPTIDE: B Natriuretic Peptide: 3217.7 pg/mL — ABNORMAL HIGH (ref 0.0–100.0)

## 2017-07-26 MED ORDER — ASPIRIN 300 MG RE SUPP
300.0000 mg | Freq: Every day | RECTAL | Status: DC
  Administered 2017-07-27: 300 mg via RECTAL
  Filled 2017-07-26: qty 1

## 2017-07-26 MED ORDER — ALBUTEROL SULFATE (2.5 MG/3ML) 0.083% IN NEBU: 2.5000 mg | INHALATION_SOLUTION | RESPIRATORY_TRACT | Status: DC | PRN

## 2017-07-26 MED ORDER — LORAZEPAM 2 MG/ML IJ SOLN
INTRAMUSCULAR | Status: AC
  Filled 2017-07-26: qty 1

## 2017-07-26 MED ORDER — SODIUM POLYSTYRENE SULFONATE 15 GM/60ML PO SUSP
45.0000 g | Freq: Once | ORAL | Status: AC
  Administered 2017-07-27: 45 g via RECTAL
  Filled 2017-07-26 (×2): qty 180

## 2017-07-26 MED ORDER — HEPARIN SODIUM (PORCINE) 5000 UNIT/ML IJ SOLN
5000.0000 [IU] | Freq: Three times a day (TID) | INTRAMUSCULAR | Status: DC
  Administered 2017-07-27 – 2017-07-29 (×5): 5000 [IU] via SUBCUTANEOUS
  Filled 2017-07-26 (×5): qty 1

## 2017-07-26 MED ORDER — ALBUTEROL SULFATE (2.5 MG/3ML) 0.083% IN NEBU
10.0000 mg | INHALATION_SOLUTION | Freq: Once | RESPIRATORY_TRACT | Status: AC
  Administered 2017-07-26: 10 mg via RESPIRATORY_TRACT
  Filled 2017-07-26: qty 12

## 2017-07-26 MED ORDER — INSULIN ASPART 100 UNIT/ML ~~LOC~~ SOLN
SUBCUTANEOUS | Status: AC
  Filled 2017-07-26: qty 1

## 2017-07-26 MED ORDER — CALCIUM GLUCONATE 10 % IV SOLN
1.0000 g | Freq: Once | INTRAVENOUS | Status: AC
  Administered 2017-07-26: 1 g via INTRAVENOUS
  Filled 2017-07-26: qty 10

## 2017-07-26 MED ORDER — FUROSEMIDE 10 MG/ML IJ SOLN
160.0000 mg | Freq: Two times a day (BID) | INTRAVENOUS | Status: DC
  Administered 2017-07-27: 160 mg via INTRAVENOUS
  Filled 2017-07-26 (×3): qty 16

## 2017-07-26 MED ORDER — FUROSEMIDE 10 MG/ML IJ SOLN
160.0000 mg | INTRAVENOUS | Status: AC
  Administered 2017-07-26: 160 mg via INTRAVENOUS
  Filled 2017-07-26: qty 16

## 2017-07-26 MED ORDER — DEXTROSE 50 % IV SOLN
1.0000 | Freq: Once | INTRAVENOUS | Status: AC
  Administered 2017-07-26: 50 mL via INTRAVENOUS
  Filled 2017-07-26: qty 50

## 2017-07-26 MED ORDER — SODIUM BICARBONATE 8.4 % IV SOLN
50.0000 meq | Freq: Once | INTRAVENOUS | Status: AC
  Administered 2017-07-26: 50 meq via INTRAVENOUS
  Filled 2017-07-26: qty 50

## 2017-07-26 MED ORDER — SODIUM CHLORIDE 0.9% FLUSH: 3.0000 mL | INTRAVENOUS | Status: DC | PRN

## 2017-07-26 MED ORDER — SODIUM CHLORIDE 0.9 % IV SOLN
1.0000 g | Freq: Once | INTRAVENOUS | Status: AC
  Administered 2017-07-26: 1 g via INTRAVENOUS
  Filled 2017-07-26: qty 10

## 2017-07-26 MED ORDER — SODIUM CHLORIDE 0.9 % IV SOLN: 250.0000 mL | INTRAVENOUS | Status: DC | PRN

## 2017-07-26 MED ORDER — POLYVINYL ALCOHOL 1.4 % OP SOLN
2.0000 [drp] | Freq: Four times a day (QID) | OPHTHALMIC | Status: DC | PRN
  Filled 2017-07-26: qty 15

## 2017-07-26 MED ORDER — HYDRALAZINE HCL 20 MG/ML IJ SOLN: 5.0000 mg | INTRAMUSCULAR | Status: DC | PRN

## 2017-07-26 MED ORDER — SODIUM CHLORIDE 0.9% FLUSH
3.0000 mL | Freq: Two times a day (BID) | INTRAVENOUS | Status: DC
  Administered 2017-07-27: 6 mL via INTRAVENOUS
  Administered 2017-07-27 – 2017-07-28 (×3): 3 mL via INTRAVENOUS

## 2017-07-26 MED ORDER — METOPROLOL TARTRATE 5 MG/5ML IV SOLN
2.5000 mg | INTRAVENOUS | Status: DC | PRN
  Administered 2017-07-28 (×2): 2.5 mg via INTRAVENOUS
  Filled 2017-07-26 (×2): qty 5

## 2017-07-26 MED ORDER — INSULIN ASPART 100 UNIT/ML IV SOLN
10.0000 [IU] | Freq: Once | INTRAVENOUS | Status: AC
  Administered 2017-07-26: 10 [IU] via INTRAVENOUS
  Filled 2017-07-26: qty 0.1

## 2017-07-26 MED ORDER — LORAZEPAM 2 MG/ML IJ SOLN
0.5000 mg | Freq: Once | INTRAMUSCULAR | Status: AC
  Administered 2017-07-26: 0.5 mg via INTRAVENOUS

## 2017-07-26 MED ORDER — ACETAMINOPHEN 650 MG RE SUPP: 650.0000 mg | Freq: Four times a day (QID) | RECTAL | Status: DC | PRN

## 2017-07-26 MED ORDER — ASPIRIN 300 MG RE SUPP: 300.0000 mg | Freq: Every day | RECTAL | Status: DC

## 2017-07-26 MED ORDER — SODIUM POLYSTYRENE SULFONATE 15 GM/60ML PO SUSP: 45.0000 g | Freq: Once | ORAL | Status: DC
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
## 2017-07-26 NOTE — ED Notes (Signed)
Date and time results received: 08/21/17 2122  Test: I-Stat lactic Critical Value: 3.43  Name of Provider Notified: Frederick Peers MD

## 2017-07-26 NOTE — ED Triage Notes (Signed)
Patient from [facility] nursing facility for hypoxia and V TACH.  EMS called out for shortness of breath that has lasted over an hour.  60% on 5L nasal cannula.  Non rebreather applied by EMS.  Reported a rhythm of VTACH and started on amiodarone drip.  Patient able to communicate in one word answers.  Very cool to the touch.  DNR form with patient from facility.

## 2017-07-26 NOTE — ED Notes (Signed)
This RN spoke with [employer] support about patient's pacemaker.  Per tech no battery or wire problems.  No arrhythmias to note of today. Full report sent to medical records.
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
## 2017-07-26 NOTE — ED Notes (Signed)
Attempted report x 2 

## 2017-07-27 ENCOUNTER — Encounter: Payer: Medicare Other | Admitting: Nurse Practitioner

## 2017-07-27 ENCOUNTER — Inpatient Hospital Stay (HOSPITAL_COMMUNITY): Payer: Medicare Other

## 2017-07-27 ENCOUNTER — Encounter (HOSPITAL_COMMUNITY): Payer: Self-pay | Admitting: Emergency Medicine

## 2017-07-27 DIAGNOSIS — Z7189 Other specified counseling: Secondary | ICD-10-CM

## 2017-07-27 DIAGNOSIS — I5033 Acute on chronic diastolic (congestive) heart failure: Secondary | ICD-10-CM

## 2017-07-27 DIAGNOSIS — J9621 Acute and chronic respiratory failure with hypoxia: Secondary | ICD-10-CM

## 2017-07-27 DIAGNOSIS — I340 Nonrheumatic mitral (valve) insufficiency: Secondary | ICD-10-CM

## 2017-07-27 LAB — BASIC METABOLIC PANEL
ANION GAP: 15 (ref 5–15)
ANION GAP: 10 (ref 5–15)
BUN: 48 mg/dL — AB (ref 8–23)
BUN: 50 mg/dL — ABNORMAL HIGH (ref 8–23)
CALCIUM: 10.4 mg/dL — AB (ref 8.9–10.3)
CALCIUM: 10.1 mg/dL (ref 8.9–10.3)
CHLORIDE: 105 mmol/L (ref 98–111)
CO2: 22 mmol/L (ref 22–32)
CO2: 26 mmol/L (ref 22–32)
Chloride: 101 mmol/L (ref 98–111)
Creatinine, Ser: 3.43 mg/dL — ABNORMAL HIGH (ref 0.44–1.00)
Creatinine, Ser: 3.68 mg/dL — ABNORMAL HIGH (ref 0.44–1.00)
GFR calc Af Amer: 12 mL/min — ABNORMAL LOW (ref >60)
GFR calc non Af Amer: 10 mL/min — ABNORMAL LOW (ref >60)
GFR, EST AFRICAN AMERICAN: 11 mL/min — AB (ref >60)
GFR, EST NON AFRICAN AMERICAN: 11 mL/min — AB (ref >60)
Glucose, Bld: 87 mg/dL (ref 70–99)
Glucose, Bld: 68 mg/dL — ABNORMAL LOW (ref 70–99)
Potassium: 7 mmol/L (ref 3.5–5.1)
Potassium: 6.9 mmol/L (ref 3.5–5.1)
SODIUM: 141 mmol/L (ref 135–145)
Sodium: 138 mmol/L (ref 135–145)

## 2017-07-27 LAB — ECHOCARDIOGRAM COMPLETE: Weight: 2134.05 oz

## 2017-07-27 LAB — LACTIC ACID, PLASMA
LACTIC ACID, VENOUS: 3.8 mmol/L — AB (ref 0.5–1.9)
Lactic Acid, Venous: 4.2 mmol/L (ref 0.5–1.9)

## 2017-07-27 LAB — CREATININE, URINE, RANDOM: CREATININE, URINE: 59.65 mg/dL

## 2017-07-27 LAB — MRSA PCR SCREENING: MRSA by PCR: NEGATIVE

## 2017-07-27 LAB — TROPONIN I
TROPONIN I: 0.03 ng/mL — AB (ref <0.03)
Troponin I: <0.03 ng/mL (ref <0.03)

## 2017-07-27 LAB — SODIUM, URINE, RANDOM: Sodium, Ur: 10 mmol/L

## 2017-07-27 MED ORDER — ORAL CARE MOUTH RINSE
15.0000 mL | Freq: Two times a day (BID) | OROMUCOSAL | Status: DC
  Administered 2017-07-28 – 2017-07-29 (×3): 15 mL via OROMUCOSAL

## 2017-07-27 MED ORDER — LORAZEPAM 2 MG/ML IJ SOLN: 0.5000 mg | Freq: Once | INTRAMUSCULAR | Status: DC | PRN

## 2017-07-27 MED ORDER — ATROPINE SULFATE 1 MG/10ML IJ SOSY: 0.5000 mg | PREFILLED_SYRINGE | INTRAMUSCULAR | Status: DC | PRN

## 2017-07-27 MED ORDER — INSULIN ASPART 100 UNIT/ML IV SOLN
10.0000 [IU] | Freq: Once | INTRAVENOUS | Status: AC
  Administered 2017-07-27: 10 [IU] via INTRAVENOUS

## 2017-07-27 MED ORDER — ALBUTEROL SULFATE (2.5 MG/3ML) 0.083% IN NEBU: 2.5000 mg | INHALATION_SOLUTION | RESPIRATORY_TRACT | Status: DC | PRN

## 2017-07-27 MED ORDER — LORAZEPAM 2 MG/ML IJ SOLN
0.5000 mg | Freq: Once | INTRAMUSCULAR | Status: AC
  Administered 2017-07-27: 0.5 mg via INTRAVENOUS

## 2017-07-27 MED ORDER — LORAZEPAM 2 MG/ML IJ SOLN
INTRAMUSCULAR | Status: AC
  Filled 2017-07-27: qty 1

## 2017-07-27 MED ORDER — DEXTROSE 50 % IV SOLN
50.0000 mL | Freq: Once | INTRAVENOUS | Status: AC
  Administered 2017-07-27: 50 mL via INTRAVENOUS
  Filled 2017-07-27: qty 50

## 2017-07-27 MED ORDER — CHLORHEXIDINE GLUCONATE 0.12 % MT SOLN
15.0000 mL | Freq: Two times a day (BID) | OROMUCOSAL | Status: DC
  Administered 2017-07-27 – 2017-07-29 (×5): 15 mL via OROMUCOSAL
  Filled 2017-07-27 (×4): qty 15

## 2017-07-27 MED ORDER — SODIUM CHLORIDE 0.9 % IV SOLN
INTRAVENOUS | Status: DC
  Administered 2017-07-27 – 2017-07-28 (×3): via INTRAVENOUS

## 2017-07-27 MED ORDER — LORAZEPAM 2 MG/ML IJ SOLN
0.5000 mg | Freq: Once | INTRAMUSCULAR | Status: AC
  Administered 2017-07-27: 0.5 mg via INTRAVENOUS
  Filled 2017-07-27: qty 1

## 2017-07-27 NOTE — Progress Notes (Signed)
Pt's son Earl Lites as well as her nephew were updated on pt's POC.  Son is in [place] & stated he should be back & here around 2300 tomorrow night. Son gave RN his wife's number [phone] if we need to contact him before then d/t his phone being messed up. The first level of contact until he gets back is the pt's nephew larry. Sanda Linger, RN

## 2017-07-27 NOTE — Progress Notes (Signed)
[facility] TEAM 1 - Stepdown/ICU TEAM  Tracey [name]  ASU:[id number] DOB: 1922/02/22 DOA: 08-22-17 PCP: Kermit Balo, DO    Brief Narrative:  84yo F w/ a hx of HTN, hyperlipidemia, GERD, gout, depression, anxiety, aortic insufficiency, left eye blindness, CAD, CHF, atrial fibrillation not on anticoagulants, cerebral aneurysm, tachycardia-bradycardia syndrome s/p pacer, chronic O2 dependence, and CKD4 who presented w/ SOB and confusion.    Once in the ED she required BIPAP support due to hypoxia.    Significant Events: 7/4 admit   Subjective: Pt is awake but non-communicative.  She can not provide a ROS.  There is no family present at the time of my exam.    Assessment & Plan:  Acute on chronic respiratory failure with hypoxia  TTE 06/15/2015 showed EF of 55-60% - TTE 07/27/17 noted EF 40-45% w/ hypokinesis of anteroseptal myocardium - no gross volume overload on exam - perhaps resp distress is due to AMS and hypoventilation  Newly diagnoses Severe R heart failure and severe TVR / mod pulm HTN Care w/ volume management   Hyperkalemia Follow on tele - hold on further kayexelate as pt having diarrhea now   AoCKD-IV Baseline Cr is 2.0 - crt 3.21 at presentation and is worsening w/ diuresis - hydrate and follow   Recent Labs  Lab 22-Aug-2017 2055 07/27/17 0359 07/27/17 1630  CREATININE 3.21* 3.43* 3.68*   Acute metabolic encephalopathy likely due to uremia and compounded by hypoxia - follow   Anxiety state prn ativan for agitation  Essential hypertension Pt is actually hypotensive at this time - hydrate and follow  Hx of CAD  Gout hold allopurinol   Tachycardia-bradycardia syndrome s/p pacer   Atrial Fibrillation CHA2DS2-VASc Score is 6 - used to be on Eliquis, which was discontinued due to bleeding (?epistaxis)    DVT prophylaxis: SQ heparin  Code Status: DNR -  NO CODE Family Communication: no family present at time of exam  Disposition Plan:  SDU  Consultants:  none  Antimicrobials:  none  Objective: Blood pressure 130/75, pulse 86, temperature (!) 97.3 F (36.3 C), temperature source Axillary, resp. rate (!) 21, weight 60.5 kg (133 lb 6.1 oz), SpO2 98 %.  Intake/Output Summary (Last 24 hours) at 07/27/2017 1806 Last data filed at 07/27/2017 0700 Gross per 24 hour  Intake 67.82 ml  Output -  Net 67.82 ml   Filed Weights   07/27/17 0039 07/27/17 0506  Weight: 59.9 kg (132 lb 0.9 oz) 60.5 kg (133 lb 6.1 oz)    Examination: General: No acute respiratory distress Lungs: Clear to auscultation bilaterally without wheezes or crackles Cardiovascular: Regular rate and rhythm without murmur gallop or rub normal S1 and S2 Abdomen: Nontender, nondistended, soft, bowel sounds positive, no rebound, no ascites, no appreciable mass Extremities: No significant cyanosis, clubbing, or edema bilateral lower extremities  CBC: Recent Labs  Lab August 22, 2017 2055  WBC 4.4  NEUTROABS 1.6*  HGB 14.2  HCT 47.0*  MCV 105.1*  PLT 160   Basic Metabolic Panel: Recent Labs  Lab Aug 22, 2017 2055 22-Aug-2017 2210 07/27/17 0359 07/27/17 1630  NA 136  --  138 141  K >7.5* 7.4* 7.0* 6.9*  CL 103  --  101 105  CO2 21*  --  22 26  GLUCOSE 147*  --  87 68*  BUN 46*  --  48* 50*  CREATININE 3.21*  --  3.43* 3.68*  CALCIUM 10.2  --  10.4* 10.1   GFR: Estimated Creatinine Clearance: 8.1 mL/min (  A) (by C-G formula based on SCr of 3.68 mg/dL (H)).  Liver Function Tests: Recent Labs  Lab August 08, 2017 2055  AST 34  ALT 12  ALKPHOS 94  BILITOT 1.4*  PROT 7.0  ALBUMIN 4.0    Cardiac Enzymes: Recent Labs  Lab 2017-08-08 2342 07/27/17 0359 07/27/17 1049  TROPONINI <0.03 <0.03 0.03*    Recent Results (from the past 240 hour(s))  MRSA PCR Screening     Status: None   Collection Time: 07/27/17  5:26 AM  Result Value Ref Range Status   MRSA by PCR NEGATIVE NEGATIVE Final    Comment:        The GeneXpert MRSA Assay (FDA approved for NASAL  specimens only), is one component of a comprehensive MRSA colonization surveillance program. It is not intended to diagnose MRSA infection nor to guide or monitor treatment for MRSA infections. Performed at [facility], [address] [place], [place] [address]      Scheduled Meds: . heparin  5,000 Units Subcutaneous Q8H  . sodium chloride flush  3 mL Intravenous Q12H     LOS: 1 day   Lonia Blood, MD [facility] Office  [phone] Pager - Text Page per Loretha Stapler as per below:  On-Call/Text Page:      [email or web address]      password [email or web address]  If 7PM-7AM, please contact night-coverage [email or web address] Password [email or web address] 07/27/2017, 6:06 PM

## 2017-07-27 NOTE — Consult Note (Signed)
Consultation Note Date: 07/27/2017   Patient Name: Tracey [name]  DOB: 12-04-1922  MRN: [id number]  Age / Sex: 84 y.o., female  PCP: Kermit Balo, DO Referring Physician: Lonia Blood, MD  Reason for Consultation: Establishing goals of care  HPI/Patient Profile: 84 y.o. female  with past medical history of hypertension, hyperlipidemia, GERD, gout, depression, anxiety, aortic insufficiency, CAD, CHF, A. fib, cerebral aneurysm, tachybradycardia syndrome status post pacer, chronic O2 dependence, CKD admitted on 08/07/2017 with breath and confusion that required BiPAP support due to hypoxia.  Palliative have consulted for goals of care  Clinical Assessment and Goals of Care: Consult received.  Chart reviewed including personal review of pertinent imaging and laboratory data.  Saw and examined [name].  She is lying in bed and resting comfortably but does not interact other than to open her eyes.  No family present at the bedside.  I called and was able to reach her nephew, [name].  [name] reports that the most important things to her have always been her family.  This includes a son who is currently in [place] and working to get back to [place].  He feels the doctors have been doing a good job explaining things to family and understand that she is at high risk for continued decompensation.  We reviewed her clinical course and I answered all questions to the best of my ability.  SUMMARY OF RECOMMENDATIONS   -DNR/DNI -Continue current therapy.  Her son is working to return from [place].  Plan to continue with current interventions until he arrives.  Code Status/Advance Care Planning:  DNR  Palliative Prophylaxis:   Delirium Protocol and Frequent Pain Assessment  Prognosis:   Guarded  Discharge Planning: To Be Determined      Primary Diagnoses: Present on Admission: . CAD  (coronary artery disease) . Anxiety state . Acute on chronic diastolic CHF (congestive heart failure) (HCC) . Acute renal failure superimposed on stage 4 chronic kidney disease (HCC) . Essential hypertension . GERD . Gouty arthritis . Pacemaker . Tachycardia-bradycardia syndrome (HCC) . Atrial fibrillation, chronic (HCC) . Hyperkalemia . Lactic acidosis . Acute on chronic respiratory failure with hypoxia (HCC) . Acute metabolic encephalopathy   I have reviewed the medical record, interviewed the patient and family, and examined the patient. The following aspects are pertinent.  Past Medical History:  Diagnosis Date  . Anemia, iron deficiency   . Anxiety   . Aortic insufficiency    a. 03/2014 Echo: Mod AI.  Marland Kitchen Arthritis    "hands"  . Blind left eye    "central vision"  . CAD (coronary artery disease) 2007   a. 2007 s/p stenting x 2 RCA;  b. 2008 PTCA RCA; c. 05/2011 MV: nl perfusion.  . Cerebral aneurysm, nonruptured   . Chronic diastolic CHF (congestive heart failure) (HCC)    a. 03/2014 Echo: Nl EF, mod AI, mild MR, mild to mod biatrial enlargement, mildly increase PASP.  Marland Kitchen CKD (chronic kidney disease), stage IV (HCC)   . Depression   .  Diverticulosis of colon (without mention of hemorrhage)   . Epistaxis   . GERD (gastroesophageal reflux disease)   . Gout attack 05/31/11   right great  . History of migraine   . Hyperlipemia   . Hyperthyroidism   . IBS (irritable bowel syndrome)   . Irritable bowel syndrome   . Macular degeneration of both eyes   . Osteoarthritis   . Panic disorder without agoraphobia   . Permanent atrial fibrillation (HCC)    a. CHA2DS2VASc = 6-->chronic coumadin.  . Personal history of goiter   . Restless leg syndrome 05/31/11  . Tachycardia-bradycardia syndrome (HCC) 2009   a. 2009 s/ p PPM (MDT)  . Thyroid nodule    Biospsy benign  . Unspecified essential hypertension    Social History   Socioeconomic History  . Marital status: Widowed     Spouse name: Not on file  . Number of children: 1  . Years of education: Not on file  . Highest education level: Not on file  Occupational History  . Occupation: Tree surgeon  Social Needs  . Financial resource strain: Not on file  . Food insecurity:    Worry: Not on file    Inability: Not on file  . Transportation needs:    Medical: Not on file    Non-medical: Not on file  Tobacco Use  . Smoking status: Never Smoker  . Smokeless tobacco: Never Used  Substance and Sexual Activity  . Alcohol use: No  . Drug use: No  . Sexual activity: Yes  Lifestyle  . Physical activity:    Days per week: Not on file    Minutes per session: Not on file  . Stress: Not on file  Relationships  . Social connections:    Talks on phone: Not on file    Gets together: Not on file    Attends religious service: Not on file    Active member of club or organization: Not on file    Attends meetings of clubs or organizations: Not on file    Relationship status: Not on file  Other Topics Concern  . Not on file  Social History Narrative   Tobacco use, amount per day now: NONE   Past tobacco use, amount per day: NONE   How many years did you use tobacco:   Alcohol use (drinks per week):NONE   Diet: LOW SODIUM, HEART HEALTHY   Do you drink/eat things with caffeine:   Marital status: WIDOW                     What year were you married? 2012   Do you live in a house, apartment, assisted living, condo, trailer, etc.? ASSISTED LIVING APT   Is it one or more stories? 2   How many persons live in your home? 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  YES                                Type and how often? WEEKLY   Do you have a living will? NO   Do you have a DNR form?  NO FULL CODE     If not, do you want to discuss one? DON'T KNOW   Do you have signed POA/HPOA forms?   DON'T KNOW  If so, please bring to you appointment     Family History  Problem Relation Age of Onset  . Cancer Mother   . Cancer Father   . Ovarian cancer Unknown   . Uterine cancer Unknown   . Colon polyps Unknown   . Diabetes Unknown   . Heart disease Sister   . Heart disease Sister    Scheduled Meds: . heparin  5,000 Units Subcutaneous Q8H  . sodium chloride flush  3 mL Intravenous Q12H   Continuous Infusions: . sodium chloride    . sodium chloride     PRN Meds:.sodium chloride, acetaminophen, albuterol, hydrALAZINE, metoprolol tartrate, polyvinyl alcohol, sodium chloride flush Medications Prior to Admission:  Prior to Admission medications   Medication Sig Start Date End Date Taking? Authorizing Provider  acetaminophen (TYLENOL) 500 MG tablet Take 500 mg by mouth at bedtime as needed (restless legs).   Yes [provider]  allopurinol (ZYLOPRIM) 100 MG tablet Take 200 mg by mouth daily.   Yes [provider]  amoxicillin (AMOXIL) 500 MG capsule Take 2,000 mg by mouth once. Before dental procedures   Yes [provider]  aspirin EC 81 MG tablet Take 81 mg by mouth daily.   Yes [provider]  calcitRIOL (ROCALTROL) 0.25 MCG capsule Take 0.25 mcg by mouth every other day.    Yes [provider]  diazepam (VALIUM) 2 MG tablet Take 2 mg by mouth at bedtime as needed (insomnia).  05/25/17  Yes [provider]  DULoxetine (CYMBALTA) 60 MG capsule Take 1 capsule (60 mg total) by mouth daily. (increased 03/21/17) 04/18/17  Yes Reed, [name] L, DO  esomeprazole (NEXIUM) 40 MG capsule Take 40 mg by mouth daily at 12 noon.   Yes [provider]  furosemide (LASIX) 80 MG tablet Take 160 mg by mouth 2 (two) times daily.   Yes [provider]  metoprolol succinate (TOPROL-XL) 100 MG 24 hr tablet Take 175 mg by mouth daily.  04/06/16  Yes [provider]  Multiple Vitamins-Minerals (I-VITE PO) Take 1 tablet by mouth daily.    Yes [provider]  Multiple  Vitamins-Minerals (MULTIVITAMIN WITH MINERALS) tablet Take 1 tablet by mouth daily.   Yes [provider]  polyvinyl alcohol (LIQUIFILM TEARS) 1.4 % ophthalmic solution Place 2 drops into both eyes 4 (four) times daily as needed for dry eyes.   Yes [provider]  potassium chloride SA (K-DUR,KLOR-CON) 20 MEQ tablet Take 40 mEq by mouth 2 (two) times daily.   Yes [provider]  Probiotic Product (ALIGN) 4 MG CAPS Take 1 capsule by mouth daily.   Yes [provider]  sodium chloride (OCEAN) 0.65 % SOLN nasal spray Place 1 spray into both nostrils daily as needed for congestion.    Yes [provider]   Allergies  Allergen Reactions  . Codeine Phosphate Anxiety    "makes me so nervous I feel like I'm dying"  . Meperidine Hcl Anxiety    "makes me think I'm going to die"  . Morphine Sulfate Other (See Comments)    "turned red as a beet"  . Meperidine Other (See Comments)    "makes me feel like I'm dying."  . Diovan [Valsartan] Rash and Other (See Comments)    unknown  . Lopid [Gemfibrozil] Rash and Other (See Comments)  . Tape Rash  . Tricor [Fenofibrate] Rash and Other (See Comments)         Review of Systems  Unable  to perform ROS  Physical Exam  General: Lying in bed.  Frail.  Opens eyes to tactile stimulation but does not otherwise communicate. Heart: Regular rate and rhythm. No murmur appreciated. Lungs: Decreased air movement, clear Abdomen: Soft, nontender, nondistended, positive bowel sounds.  Ext: No significant edema Skin: Warm and dry  Vital Signs: BP 94/73 (BP Location: Right Arm)   Pulse (!) 109   Temp (!) 97.3 F (36.3 C) (Axillary)   Resp 20   Wt 60.5 kg (133 lb 6.1 oz)   SpO2 100%   BMI 23.63 kg/m  Pain Scale: Faces       SpO2: SpO2: 100 % O2 Device:SpO2: 100 % O2 Flow Rate: .O2 Flow Rate (L/min): 15 L/min  IO: Intake/output summary:   Intake/Output Summary (Last 24 hours) at 07/27/2017 1949 Last data  filed at 07/27/2017 1846 Gross per 24 hour  Intake 67.82 ml  Output 200 ml  Net -132.18 ml    LBM: Last BM Date: 07/27/17 Baseline Weight: Weight: 59.9 kg (132 lb 0.9 oz) Most recent weight: Weight: 60.5 kg (133 lb 6.1 oz)     Palliative Assessment/Data:     Time In: 1315 Time Out: 1410 Time Total: 55 Greater than 50%  of this time was spent counseling and coordinating care related to the above assessment and plan.  Signed by: Romie Minus, MD   Please contact Palliative Medicine Team phone at [phone] for questions and concerns.  For individual provider: See Loretha Stapler

## 2017-07-27 NOTE — Progress Notes (Signed)
  Echocardiogram 2D Echocardiogram has been performed.  Tracey [name] G [name] 07/27/2017, 1:21 PM

## 2017-07-27 NOTE — Progress Notes (Signed)
RT called to pt room regarding pt desatting. Pt not tolerating bipap at this time, continuously trying to pull off bipap mask. Doctor at bedside, stated he contacted pt's nephew and he is on the way to the hospital. Pt is now placed on a non rebreather. RT will continue to monitor.

## 2017-07-28 ENCOUNTER — Inpatient Hospital Stay (HOSPITAL_COMMUNITY): Payer: Medicare Other

## 2017-07-28 LAB — CBC
HEMATOCRIT: 45.6 % (ref 36.0–46.0)
HEMOGLOBIN: 13.7 g/dL (ref 12.0–15.0)
MCH: 32.2 pg (ref 26.0–34.0)
MCHC: 30 g/dL (ref 30.0–36.0)
MCV: 107 fL — AB (ref 78.0–100.0)
PLATELETS: 135 10*3/uL — AB (ref 150–400)
RBC: 4.26 MIL/uL (ref 3.87–5.11)
RDW: 18.6 % — AB (ref 11.5–15.5)
WBC: 13.6 10*3/uL — AB (ref 4.0–10.5)

## 2017-07-28 LAB — BASIC METABOLIC PANEL
ANION GAP: 15 (ref 5–15)
BUN: 55 mg/dL — ABNORMAL HIGH (ref 8–23)
CHLORIDE: 106 mmol/L (ref 98–111)
CO2: 21 mmol/L — ABNORMAL LOW (ref 22–32)
CREATININE: 3.89 mg/dL — AB (ref 0.44–1.00)
Calcium: 10.1 mg/dL (ref 8.9–10.3)
GFR calc non Af Amer: 9 mL/min — ABNORMAL LOW (ref >60)
GFR, EST AFRICAN AMERICAN: 11 mL/min — AB (ref >60)
Glucose, Bld: 73 mg/dL (ref 70–99)
SODIUM: 142 mmol/L (ref 135–145)

## 2017-07-28 LAB — HEPATIC FUNCTION PANEL
ALT: 14 U/L (ref 0–44)
AST: 30 U/L (ref 15–41)
Albumin: 3.5 g/dL (ref 3.5–5.0)
Alkaline Phosphatase: 87 U/L (ref 38–126)
Bilirubin, Direct: 0.5 mg/dL — ABNORMAL HIGH (ref 0.0–0.2)
Indirect Bilirubin: 1.1 mg/dL — ABNORMAL HIGH (ref 0.3–0.9)
Total Bilirubin: 1.6 mg/dL — ABNORMAL HIGH (ref 0.3–1.2)
Total Protein: 6.5 g/dL (ref 6.5–8.1)

## 2017-07-28 MED ORDER — LIP MEDEX EX OINT
TOPICAL_OINTMENT | CUTANEOUS | Status: DC | PRN
  Filled 2017-07-28: qty 7

## 2017-07-28 MED ORDER — METOPROLOL SUCCINATE ER 25 MG PO TB24: 25.0000 mg | ORAL_TABLET | Freq: Every day | ORAL | Status: DC

## 2017-07-28 MED ORDER — METOPROLOL TARTRATE 5 MG/5ML IV SOLN: 5.0000 mg | Freq: Four times a day (QID) | INTRAVENOUS | Status: DC

## 2017-07-28 MED ORDER — SODIUM CHLORIDE 0.9 % IV BOLUS
250.0000 mL | Freq: Once | INTRAVENOUS | Status: AC
  Administered 2017-07-28: 250 mL via INTRAVENOUS

## 2017-07-28 MED ORDER — DILTIAZEM LOAD VIA INFUSION
15.0000 mg | Freq: Once | INTRAVENOUS | Status: AC
  Administered 2017-07-28: 15 mg via INTRAVENOUS
  Filled 2017-07-28: qty 15

## 2017-07-28 MED ORDER — DILTIAZEM HCL-DEXTROSE 100-5 MG/100ML-% IV SOLN (PREMIX)
5.0000 mg/h | INTRAVENOUS | Status: DC
Start: 2017-07-28 — End: 2017-07-30
  Administered 2017-07-28 – 2017-07-29 (×3): 5 mg/h via INTRAVENOUS
  Filled 2017-07-28 (×3): qty 100

## 2017-07-28 MED ORDER — METOPROLOL TARTRATE 5 MG/5ML IV SOLN
10.0000 mg | Freq: Four times a day (QID) | INTRAVENOUS | Status: DC
  Administered 2017-07-29 (×4): 10 mg via INTRAVENOUS
  Filled 2017-07-28 (×4): qty 10

## 2017-07-28 MED ORDER — PATIROMER SORBITEX CALCIUM 8.4 G PO PACK
8.4000 g | PACK | Freq: Every day | ORAL | Status: DC
  Filled 2017-07-28: qty 1

## 2017-07-28 MED ORDER — HALOPERIDOL LACTATE 5 MG/ML IJ SOLN
1.0000 mg | Freq: Four times a day (QID) | INTRAMUSCULAR | Status: DC | PRN
Start: 2017-07-28 — End: 2017-07-30
  Administered 2017-07-28 – 2017-07-29 (×2): 2 mg via INTRAVENOUS
  Administered 2017-07-29: 1 mg via INTRAVENOUS
  Filled 2017-07-28 (×4): qty 1

## 2017-07-28 MED ORDER — FENTANYL CITRATE (PF) 100 MCG/2ML IJ SOLN
12.5000 ug | INTRAMUSCULAR | Status: DC | PRN
  Administered 2017-07-29 (×2): 12.5 ug via INTRAVENOUS
  Filled 2017-07-28 (×2): qty 2

## 2017-07-28 MED ORDER — STERILE WATER FOR INJECTION IV SOLN
INTRAVENOUS | Status: DC
  Administered 2017-07-28 – 2017-07-29 (×5): via INTRAVENOUS
  Filled 2017-07-28 (×4): qty 850

## 2017-07-28 MED ORDER — LORAZEPAM 2 MG/ML IJ SOLN
0.5000 mg | INTRAMUSCULAR | Status: DC | PRN
  Administered 2017-07-28: 0.5 mg via INTRAVENOUS
  Filled 2017-07-28: qty 1

## 2017-07-28 MED ORDER — LORAZEPAM 2 MG/ML IJ SOLN
0.5000 mg | INTRAMUSCULAR | Status: DC | PRN
  Administered 2017-07-28 – 2017-07-29 (×2): 0.5 mg via INTRAVENOUS
  Filled 2017-07-28 (×2): qty 1

## 2017-07-28 MED ORDER — METOPROLOL SUCCINATE ER 50 MG PO TB24: 175.0000 mg | ORAL_TABLET | Freq: Every day | ORAL | Status: DC

## 2017-07-28 NOTE — Progress Notes (Signed)
TEAM 1 - Stepdown/ICU TEAM  Tracey [name]  ZOX:[id number] DOB: [birth year] DOA: 07/29/2017 PCP: Kermit Balo, DO    Brief Narrative:  84yo F w/ a hx of HTN, hyperlipidemia, GERD, gout, depression, anxiety, aortic insufficiency, left eye blindness, CAD, CHF, atrial fibrillation not on anticoagulants, cerebral aneurysm, tachycardia-bradycardia syndrome s/p pacer, chronic O2 dependence, and CKD4 who presented w/ SOB and confusion.    Once in the ED she required BIPAP support due to hypoxia.    Significant Events: 7/4 admit   Subjective: The patient is non-communicative.  She is awake and fidgeting about in the bed.  She appears uncomfortable.  She remains persistently tachycardic.  She appears to be anxious.   Assessment & Plan:  Acute on chronic hypoxic respiratory failure  Likely driven by AMS/hypoventilation, as well as component of R heart failure - doing well on [place] O2 only at this time   Acute Newly Diagnosed systolic CHF TTE 09/02/9145 showed EF of 55-60% side with no WMA - TTE 07/27/17 noted EF 40-45% with hypokinesis of anteroseptal myocardium - no gross volume overload on exam - new WMA on TTE suggestive of ischemic etiology - hydrating and following   Newly diagnosed Severe R heart failure and severe TVR / mod pulm HTN Hydrating now in face of AKI and hypotension - w/ combined RV and LV failure volume management will be difficult   Hyperkalemia Follow on tele - blood this morning hemolyzed but K+ clearly still elevated - avoiding kayexelate enema as leaning toward comfort care, and unable to take oral meds at this time - give low dose bicarb in IV   AoCKD-IV Baseline Cr is 2.0 - crt 3.21 at presentation and is climbing - renal US w/o definitive evidence of acute severe hydro to explain AKF - suspect poor perfusion in setting of hypotension exacerbated by high dose diuretic use   Recent Labs  Lab 07/23/2017 2055 07/27/17 0359 07/27/17 1630 07/28/17 0424    CREATININE 3.21* 3.43* 3.68* 3.89*   Acute metabolic encephalopathy likely due to uremia and hypoxia   Anxiety state prn ativan for agitation  Essential hypertension BP appears to be trending upward w/ hydration   Hx of CAD  Gout hold allopurinol   Tachycardia-bradycardia syndrome s/p pacer  Currently persistently tachycardic - adjust medical tx as hydration is not correcting the issue   Atrial Fibrillation CHA2DS2-VASc Score is 6 - used to be on Eliquis, which was discontinued due to bleeding (?epistaxis)   Disposition  Continue supportive care balanced w/ comfort efforts until her son is able to arrive (in [place]) at which time will likely transition to comfort care only   DVT prophylaxis: SQ heparin  Code Status: DNR -  NO CODE Family Communication: no family present at time of exam  Disposition Plan: SDU  Consultants:  Palliative Care   Antimicrobials:  none  Objective: Blood pressure 117/67, pulse (!) 120, temperature (!) 97.4 F (36.3 C), temperature source Rectal, resp. rate 19, weight 60.6 kg (133 lb 9.6 oz), SpO2 94 %.  Intake/Output Summary (Last 24 hours) at 07/28/2017 1544 Last data filed at 07/28/2017 1300 Gross per 24 hour  Intake 1396.56 ml  Output 400 ml  Net 996.56 ml   Filed Weights   07/27/17 0039 07/27/17 0506 07/28/17 0641  Weight: 59.9 kg (132 lb 0.9 oz) 60.5 kg (133 lb 6.1 oz) 60.6 kg (133 lb 9.6 oz)    Examination: General: some mild tachypnea  Lungs: no wheezing or pronounced  crackles  Cardiovascular: trachycardic - no M or rub  Abdomen: Nondistended, soft, bowel sounds hypoactive, no rebound, no ascites Extremities: trace edema B LE   CBC: Recent Labs  Lab 08/16/2017 2055 07/28/17 0424  WBC 4.4 13.6*  NEUTROABS 1.6*  --   HGB 14.2 13.7  HCT 47.0* 45.6  MCV 105.1* 107.0*  PLT 160 135*   Basic Metabolic Panel: Recent Labs  Lab 07/27/17 0359 07/27/17 1630 07/28/17 0424  NA 138 141 142  K 7.0* 6.9* >7.5*  CL 101 105  106  CO2 22 26 21*  GLUCOSE 87 68* 73  BUN 48* 50* 55*  CREATININE 3.43* 3.68* 3.89*  CALCIUM 10.4* 10.1 10.1   GFR: Estimated Creatinine Clearance: 7.6 mL/min (A) (by C-G formula based on SCr of 3.89 mg/dL (H)).  Liver Function Tests: Recent Labs  Lab 07/24/2017 2055 07/28/17 0424  AST 34 30  ALT 12 14  ALKPHOS 94 87  BILITOT 1.4* 1.6*  PROT 7.0 6.5  ALBUMIN 4.0 3.5    Cardiac Enzymes: Recent Labs  Lab 08/18/2017 2342 07/27/17 0359 07/27/17 1049  TROPONINI <0.03 <0.03 0.03*    Recent Results (from the past 240 hour(s))  MRSA PCR Screening     Status: None   Collection Time: 07/27/17  5:26 AM  Result Value Ref Range Status   MRSA by PCR NEGATIVE NEGATIVE Final    Comment:        The GeneXpert MRSA Assay (FDA approved for NASAL specimens only), is one component of a comprehensive MRSA colonization surveillance program. It is not intended to diagnose MRSA infection nor to guide or monitor treatment for MRSA infections. Performed at [facility], [address], [place], [place] [address]      Scheduled Meds: . chlorhexidine  15 mL Mouth Rinse BID  . heparin  5,000 Units Subcutaneous Q8H  . mouth rinse  15 mL Mouth Rinse q12n4p  . metoprolol succinate  25 mg Oral Daily  . patiromer  8.4 g Oral Daily  . sodium chloride flush  3 mL Intravenous Q12H     LOS: 2 days   Lonia Blood, MD [facility] Office  [phone] Pager - Text Page per Amion as per below:  On-Call/Text Page:      [email or web address]      password [email or web address]  If 7PM-7AM, please contact night-coverage [email or web address] Password [email or web address] 07/28/2017, 3:44 PM

## 2017-07-28 NOTE — Progress Notes (Addendum)
Pts BP 104/28 with MAP of 49. MD made aware and does not advise d/c of cardizem gtt at this time. Will continue to monitor

## 2017-07-28 NOTE — Progress Notes (Signed)
Unable to obtain accurate BP reading as pt is moving whenever BP cuff inflates. Attempted with various extremities. NP on call notified. All other VSS.

## 2017-07-28 NOTE — Progress Notes (Signed)
HR sustaining >125 gave Metoprolol 2.5 mg IV

## 2017-07-28 NOTE — Plan of Care (Signed)
  Problem: Clinical Measurements: Goal: Respiratory complications will improve Outcome: Progressing   

## 2017-07-28 NOTE — Progress Notes (Signed)
Pt can not take oral meds- paged md

## 2017-07-29 DIAGNOSIS — Z515 Encounter for palliative care: Secondary | ICD-10-CM

## 2017-07-29 LAB — BASIC METABOLIC PANEL
Anion gap: 17 — ABNORMAL HIGH (ref 5–15)
BUN: 71 mg/dL — AB (ref 8–23)
CHLORIDE: 104 mmol/L (ref 98–111)
CO2: 20 mmol/L — ABNORMAL LOW (ref 22–32)
Calcium: 9.4 mg/dL (ref 8.9–10.3)
Creatinine, Ser: 4.39 mg/dL — ABNORMAL HIGH (ref 0.44–1.00)
GFR calc Af Amer: 9 mL/min — ABNORMAL LOW (ref >60)
GFR calc non Af Amer: 8 mL/min — ABNORMAL LOW (ref >60)
GLUCOSE: 100 mg/dL — AB (ref 70–99)
POTASSIUM: 6.4 mmol/L — AB (ref 3.5–5.1)
Sodium: 141 mmol/L (ref 135–145)

## 2017-07-29 LAB — CBC
HCT: 40 % (ref 36.0–46.0)
Hemoglobin: 12.4 g/dL (ref 12.0–15.0)
MCH: 32.1 pg (ref 26.0–34.0)
MCHC: 31 g/dL (ref 30.0–36.0)
MCV: 103.6 fL — ABNORMAL HIGH (ref 78.0–100.0)
Platelets: 129 10*3/uL — ABNORMAL LOW (ref 150–400)
RBC: 3.86 MIL/uL — ABNORMAL LOW (ref 3.87–5.11)
RDW: 18.6 % — ABNORMAL HIGH (ref 11.5–15.5)
WBC: 9.4 10*3/uL (ref 4.0–10.5)

## 2017-07-29 MED ORDER — LORAZEPAM 2 MG/ML IJ SOLN
0.5000 mg | INTRAMUSCULAR | Status: DC | PRN
  Administered 2017-07-29: 1 mg via INTRAVENOUS
  Filled 2017-07-29: qty 1

## 2017-07-29 MED ORDER — GLYCOPYRROLATE 0.2 MG/ML IJ SOLN
0.1000 mg | INTRAMUSCULAR | Status: DC | PRN
  Administered 2017-07-29 – 2017-07-30 (×3): 0.1 mg via INTRAVENOUS
  Filled 2017-07-29 (×3): qty 1

## 2017-07-29 MED ORDER — FENTANYL 2500MCG IN NS 250ML (10MCG/ML) PREMIX INFUSION
0.0000 ug/h | INTRAVENOUS | Status: DC
  Administered 2017-07-29: 10 ug/h via INTRAVENOUS
  Filled 2017-07-29: qty 250

## 2017-07-29 NOTE — Progress Notes (Signed)
[place] TEAM 1 - Stepdown/ICU TEAM  ISAAC GATTA  JPE:[id number] DOB: Feb 29, 1922 DOA: 11-Aug-2017 PCP: Kermit Balo, DO    Brief Narrative:  84yo F w/ a hx of HTN, hyperlipidemia, GERD, gout, depression, anxiety, aortic insufficiency, left eye blindness, CAD, CHF, atrial fibrillation not on anticoagulants, cerebral aneurysm, tachycardia-bradycardia syndrome s/p pacer, chronic O2 dependence, and CKD4 who presented w/ SOB and confusion.    Once in the ED she required BIPAP support due to hypoxia.    Subjective: The patient appears to be agitated.  She is fidgeting about in the bed.  She also appears to be somewhat anxious.  There is no particular evidence of dyspnea.  She remains altered and cannot provide a reliable review of systems.  I had a lengthy discussion with the patient's son in the conference room.  I explained in great detail the grave nature of her multiple acute medical complications.  I explained to him that I did not feel that she would survive regardless of our treatment approach.  The patient's son is not yet comfortable transitioning to full comfort focused care but does admit that the patient is clearly not comfortable now and wishes for [place] to be more aggressive and adjusting her comfort meds.  He does however wish to continue conservative medical care at this time while he considers the option of full comfort focused care only.  I have explained to him that I feel she is not likely to survive this hospital stay regardless of our approach.  Assessment & Plan:  Acute on chronic hypoxic respiratory failure  Likely driven by severe R heart failure - titrate O2 as needed    Acute Newly Diagnosed systolic CHF TTE 08/13/5748 showed EF of 55-60% with no WMA - TTE 07/27/17 noted EF 40-45% with hypokinesis of anteroseptal myocardium - no gross volume overload on exam presently - new WMA on TTE suggestive of ischemic etiology   Newly diagnosed Severe R heart failure and severe  TVR / mod pulm HTN Hydrating now in face of AKI and hypotension - w/ combined RV and LV failure volume management will be difficult - slow rate of IVF as we appear to be crossing over to point of volume overload which will only worsen w/ worsening renal fxn   Hyperkalemia Follow on tele - voiding kayexelate enema as leaning toward comfort care, and unable to take oral meds at this time - cont low dose bicarb in IV   AoCKD-IV Baseline Cr is 2.0 - crt 3.21 at presentation and is climbing - renal US w/o definitive evidence of acute severe hydro to explain AKF - suspect poor perfusion in setting of hypotension exacerbated by high dose diuretic use - pt is not a HD candidate as confirmed by my discussion by Nephrology    Recent Labs  Lab 11-Aug-2017 2055 07/27/17 0359 07/27/17 1630 07/28/17 0424 07/29/17 0617  CREATININE 3.21* 3.43* 3.68* 3.89* 4.39*   Acute metabolic encephalopathy likely due to uremia and hypoxia   Anxiety state prn ativan for agitation - adjust dose   Essential hypertension > Hypotension  BP has improved w/ volume expansion   Hx of CAD  Gout hold allopurinol   Tachycardia-bradycardia syndrome s/p pacer  persistent tachycardia has resolved w/ cardizem gtt   Atrial Fibrillation CHA2DS2-VASc Score is 6 - used to be on Eliquis, which was discontinued due to bleeding (?epistaxis)   Disposition  Continue supportive care balanced w/ comfort efforts for now while her son takes some time  to consider the situation    DVT prophylaxis: SQ heparin  Code Status: DNR -  NO CODE Family Communication: spoke at length w/ son  Disposition Plan: SDU  Consultants:  Palliative Care   Antimicrobials:  none  Objective: Blood pressure (!) 107/56, pulse 69, temperature 98 F (36.7 C), temperature source Oral, resp. rate 18, weight 61.5 kg (135 lb 9.3 oz), SpO2 93 %.  Intake/Output Summary (Last 24 hours) at 07/29/2017 0941 Last data filed at 07/29/2017 0800 Gross per 24  hour  Intake 1207.28 ml  Output 150 ml  Net 1057.28 ml   Filed Weights   07/27/17 0506 07/28/17 0641 07/29/17 0500  Weight: 60.5 kg (133 lb 6.1 oz) 60.6 kg (133 lb 9.6 oz) 61.5 kg (135 lb 9.3 oz)    Examination: General: some mild tachypnea persists - agitated  Lungs: no wheezing or pronounced crackles  Cardiovascular: RRR - no rub  Abdomen: Nondistended, soft, BS hypoactive  Extremities: trace edema B LE   CBC: Recent Labs  Lab 08/22/2017 2055 07/28/17 0424 07/29/17 0617  WBC 4.4 13.6* 9.4  NEUTROABS 1.6*  --   --   HGB 14.2 13.7 12.4  HCT 47.0* 45.6 40.0  MCV 105.1* 107.0* 103.6*  PLT 160 135* 129*   Basic Metabolic Panel: Recent Labs  Lab 07/27/17 1630 07/28/17 0424 07/29/17 0617  NA 141 142 141  K 6.9* >7.5* 6.4*  CL 105 106 104  CO2 26 21* 20*  GLUCOSE 68* 73 100*  BUN 50* 55* 71*  CREATININE 3.68* 3.89* 4.39*  CALCIUM 10.1 10.1 9.4   GFR: Estimated Creatinine Clearance: 6.8 mL/min (A) (by C-G formula based on SCr of 4.39 mg/dL (H)).  Liver Function Tests: Recent Labs  Lab 08/04/2017 2055 07/28/17 0424  AST 34 30  ALT 12 14  ALKPHOS 94 87  BILITOT 1.4* 1.6*  PROT 7.0 6.5  ALBUMIN 4.0 3.5    Cardiac Enzymes: Recent Labs  Lab 08/01/2017 2342 07/27/17 0359 07/27/17 1049  TROPONINI <0.03 <0.03 0.03*    Recent Results (from the past 240 hour(s))  MRSA PCR Screening     Status: None   Collection Time: 07/27/17  5:26 AM  Result Value Ref Range Status   MRSA by PCR NEGATIVE NEGATIVE Final    Comment:        The GeneXpert MRSA Assay (FDA approved for NASAL specimens only), is one component of a comprehensive MRSA colonization surveillance program. It is not intended to diagnose MRSA infection nor to guide or monitor treatment for MRSA infections. Performed at [facility], [address], [place], [place] [address]      Scheduled Meds: . chlorhexidine  15 mL Mouth Rinse BID  . heparin  5,000 Units Subcutaneous Q8H  . mouth rinse   15 mL Mouth Rinse q12n4p  . metoprolol tartrate  10 mg Intravenous Q6H  . sodium chloride flush  3 mL Intravenous Q12H     LOS: 3 days   Lonia Blood, MD [facility] Office  [phone] Pager - Text Page per Loretha Stapler as per below:  On-Call/Text Page:      [email or web address]      password [email or web address]  If 7PM-7AM, please contact night-coverage [email or web address] Password [email or web address] 07/29/2017, 9:41 AM

## 2017-07-29 NOTE — Progress Notes (Signed)
CRITICAL VALUE ALERT  Critical Value: Potassium 6.4  Date & Time Notied:  07/29/2017 0715  Provider Notified: Potassium trending down (7.0 yesterday)  Orders Received/Actions taken: Continue current treatment

## 2017-07-29 NOTE — Progress Notes (Signed)
Paged MD for prn Robinul to decrease secretions.  Order obtained and dose given.

## 2017-07-29 NOTE — Progress Notes (Signed)
Pt agitated and restless prn med's given see mar. Oxygen switched to high flow at 13L. Resp and charge RN has been notified. Son at bedside and has been educated on pt treatment and plan of care.

## 2017-07-29 NOTE — Progress Notes (Signed)
Daily Progress Note   Patient Name: Tracey [name]       Date: 07/29/2017 DOB: 1923/01/07  Age: 84 y.o. MRN#: [id number] Attending Physician: Cherene Altes, MD Primary Care Physician: Gayland Curry, DO Admit Date: 08/11/2017  Reason for Consultation/Follow-up: Establishing goals of care  Subjective: Patient lethargic. She remains agitated at times with moaning. Son now at bedside.   Length of Stay: 3  Current Medications: Scheduled Meds:  . chlorhexidine  15 mL Mouth Rinse BID  . heparin  5,000 Units Subcutaneous Q8H  . mouth rinse  15 mL Mouth Rinse q12n4p  . metoprolol tartrate  10 mg Intravenous Q6H  . sodium chloride flush  3 mL Intravenous Q12H    Continuous Infusions: . sodium chloride    . diltiazem (CARDIZEM) infusion 5 mg/hr (07/29/17 0600)  .  sodium bicarbonate (isotonic) infusion in sterile water 100 mL/hr at 07/29/17 0558    PRN Meds: sodium chloride, acetaminophen, fentaNYL (SUBLIMAZE) injection, haloperidol lactate, lip balm, LORazepam, metoprolol tartrate, polyvinyl alcohol, sodium chloride flush  Physical Exam  Constitutional:  Critically ill appearing  Cardiovascular:  irregular  Pulmonary/Chest:  Paradoxical breathing pattern, CTA  Abdominal: Soft. Bowel sounds are normal.  Neurological:  lethargic, does not responds to stimuli  Nursing note and vitals reviewed.           Vital Signs: BP (!) 107/56   Pulse 69   Temp 98 F (36.7 C) (Oral)   Resp 18   Wt 61.5 kg (135 lb 9.3 oz)   SpO2 93%   BMI 24.02 kg/m  SpO2: SpO2: 93 % O2 Device: O2 Device: Nasal Cannula O2 Flow Rate: O2 Flow Rate (L/min): 5 L/min  Intake/output summary:   Intake/Output Summary (Last 24 hours) at 07/29/2017 0949 Last data filed at 07/29/2017 0800 Gross per 24  hour  Intake 1207.28 ml  Output 150 ml  Net 1057.28 ml   LBM: Last BM Date: 07/28/17 Baseline Weight: Weight: 59.9 kg (132 lb 0.9 oz) Most recent weight: Weight: 61.5 kg (135 lb 9.3 oz)       Palliative Assessment/Data:      Patient Active Problem List   Diagnosis Date Noted  . Atrial fibrillation, chronic ([place]) 07/23/2017  . Hyperkalemia 07/27/2017  . Lactic acidosis 08/21/2017  . Acute on chronic respiratory failure with hypoxia (  [place]) 07/27/2017  . Acute metabolic encephalopathy 74/16/3845  . Low vision, both eyes 09/10/2016  . Formed visual hallucinations 09/10/2016  . Headaches due to old head injury 05/10/2016  . Age related osteoporosis 04/27/2016  . Acute low back pain without sciatica 04/27/2016  . Neck muscle spasm 04/27/2016  . Restless leg syndrome, controlled 02/27/2016  . Impaired mobility 02/27/2016  . Moderate episode of recurrent major depressive disorder ([place]) 02/27/2016  . Generalized anxiety disorder 02/27/2016  . Enteritis due to Clostridium difficile 01/19/2016  . Right arm weakness 12/18/2015  . Acute on chronic diastolic CHF (congestive heart failure) ([place])   . CAD (coronary artery disease)   . Tachycardia-bradycardia syndrome ([place])   . Permanent atrial fibrillation ([place])   . Epistaxis, recurrent 06/04/2012  . Thyroid enlargement 02/29/2012  . Pacemaker 06/15/2011  . Retinal macular atrophy 04/27/2011  . Status post intraocular lens implant 02/02/2011  . Hyperthyroidism 01/05/2011  . Exudative age-related macular degeneration of both eyes with inactive scar ([place]) 12/08/2010  . Chorioretinal scar, macular 12/08/2010  . Long term current use of anticoagulant therapy 04/25/2010  . Asthma 10/22/2009  . RHINITIS 04/08/2009  . FECAL INCONTINENCE 07/01/2007  . Essential hypertension 06/28/2007  . MYOCARDIAL INFARCTION, HX OF 06/28/2007  . GERD 06/28/2007  . Irritable bowel syndrome with both constipation and diarrhea 06/28/2007  . Acute renal  failure superimposed on stage 4 chronic kidney disease ([place]) 06/28/2007  . Iron deficiency anemia 06/28/2007  . Hyperlipidemia 05/21/2007  . Anxiety state 05/21/2007  . CEREBRAL ANEURYSM 05/21/2007  . DIVERTICULOSIS, COLON 05/21/2007  . Gouty arthritis 05/21/2007    Palliative Care Assessment & Plan   Patient Profile: 83 y.o. female  with past medical history of hypertension, hyperlipidemia, GERD, gout, depression, anxiety, aortic insufficiency, CAD, CHF, A. fib, cerebral aneurysm, tachybradycardia syndrome status post pacer, chronic O2 dependence, CKD admitted on 08/02/2017 with breath and confusion that required BiPAP support due to hypoxia.  Palliative have consulted for goals of care  Assessment: Patient is critically ill with worsening renal function.   I met with patient's son, who just arrived from [place] earlier this morning. I reviewed patient's current medical problems and hospitalization to date. Son seems to recognize that patient's prognosis is poor. We discussed option of comfort care at length. We also discussed hospice involvement either at [facility] or residential setting. Son asked about hemodialysis and continued supportive measures. However, I suggested that such treatments would be unlikely to yield benefit or provide patient with a chance for meaningful improvement. Son would like to speak with his family and Dr. Thereasa Solo prior to making any decisions.   Son confirms DNR.   Recommendations/Plan:  Continue supportive care. Recommend transition to comfort care  Will follow.    Code Status:    Code Status Orders  (From admission, onward)        Start     Ordered   08/09/2017 2332  Do not attempt resuscitation (DNR)  Continuous    Question Answer Comment  In the event of cardiac or respiratory ARREST Do not call a "code blue"   In the event of cardiac or respiratory ARREST Do not perform Intubation, CPR, defibrillation or ACLS   In the event of cardiac or  respiratory ARREST Use medication by any route, position, wound care, and other measures to relive pain and suffering. May use oxygen, suction and manual treatment of airway obstruction as needed for comfort.      07/27/2017 2333    Code Status History  Date Active Date Inactive Code Status Order ID Comments User Context   05/30/2017 1216 08/01/2017 2049 DNR [id number] Discussed at clinic visit, scanned copy should be in documents and media Gayland Curry, DO Outpatient   01/19/2016 1114 10/26/2016 0632 DNR [id number] Discussed with pt and her son on 01/12/16 Gayland Curry, DO Outpatient   12/18/2015 1623 12/20/2015 1854 Full Code [id number]  Radene Gunning, NP ED   08/21/2012 1537 08/21/2012 2103 Full Code [id number]  Thompson Grayer, MD Inpatient   05/31/2011 1737 06/01/2011 1408 Full Code [id number]  Isabel Caprice, RN Inpatient    Advance Directive Documentation     Most Recent Value  Type of Advance Directive  Healthcare Power of Attorney, Out of facility DNR (pink MOST or yellow form)  Pre-existing out of facility DNR order (yellow form or pink MOST form)  -  "MOST" Form in Place?  -       Care plan was discussed with nursing, attending, family, and nursing staff at [facility]  Thank you for allowing the Palliative Medicine Team to assist in the care of this patient.   Time In: 0900 Time Out: 1000 Total Time 60 minutes Prolonged Time Billed NO      Greater than 50%  of this time was spent counseling and coordinating care related to the above assessment and plan.  Irean Hong, NP  Please contact Palliative Medicine Team phone at [phone] for questions and concerns.

## 2017-07-29 NOTE — Progress Notes (Signed)
RT note-Spoke with RN concerning high flow nasal canula, patient continues to require between 5-10L at times, and according to the RN a NRB today for comfort. Continue to monitor.

## 2017-07-29 NOTE — Progress Notes (Signed)
Patient's son arrived overnight from [place].  He has many questions regarding his mother's status.  Paged Dr. Thereasa Solo and the Palliative Care NP to inform them that the son is here (paged around 0830).  By 3832, both Vonna Kotyk Borders with Palliative and Dr. Thereasa Solo met with the patients son and had a lengthy discussion.

## 2017-07-30 LAB — BASIC METABOLIC PANEL
ANION GAP: 11 (ref 5–15)
BUN: 74 mg/dL — AB (ref 8–23)
CO2: 33 mmol/L — AB (ref 22–32)
Calcium: 9.3 mg/dL (ref 8.9–10.3)
Chloride: 99 mmol/L (ref 98–111)
Creatinine, Ser: 5.03 mg/dL — ABNORMAL HIGH (ref 0.44–1.00)
GFR calc Af Amer: 8 mL/min — ABNORMAL LOW (ref >60)
GFR, EST NON AFRICAN AMERICAN: 7 mL/min — AB (ref >60)
GLUCOSE: 147 mg/dL — AB (ref 70–99)
Potassium: 5.7 mmol/L — ABNORMAL HIGH (ref 3.5–5.1)
SODIUM: 143 mmol/L (ref 135–145)

## 2017-08-23 NOTE — Death Summary Note (Signed)
   Death Summary   Tracey [name]:[id number] DOB: 1922/10/07 DOA: 2017/08/18  PCP: Kermit Balo, DO  Admit date: 2017-08-18 Date of Death: August 22, 2017  Final Diagnoses:  Acute on chronic hypoxic respiratory failure  Acute Newly Diagnosed Systolic CHF Newly diagnosed Severe R heart failure and severe TVR / mod pulm HTN Hyperkalemia AoCKD-IV - Acute renal failure Acute metabolic encephalopathy Hypotension  Hx of CAD w/ expected subacute MI Gout Tachycardia-bradycardia syndrome s/p pacer  Atrial Fibrillation  History of present illness:  84yo F w/ a hx of HTN, hyperlipidemia, GERD, gout, depression, anxiety, aortic insufficiency, left eye blindness, CAD, CHF, atrial fibrillation not on anticoagulants, cerebral aneurysm, tachycardia-bradycardia syndrome s/p pacer, chronic O2 dependence, and CKD4 who presented w/ SOB and confusion.    Once in the ED she required BIPAP support due to hypoxia.    Hospital Course:  The patient was admitted to the hospital with acute respiratory distress and toxic metabolic encephalopathy.  At the time of her admission it was clear that she was critically ill and there was immediate concern that she would not likely survive her acute illness.  The patient's son was vacationing in [place] but rapidly made emergency travel arrangements so that he could be in the hospital with his mother.  An extensive evaluation was carried out and ultimately revealed the patient was suffering with newly appreciated systolic congestive heart failure, newly appreciated severe right-sided heart failure with severe tricuspid valve regurgitation, severe acute kidney failure, hyperkalemia, and suspected subacute myocardial infarction.  Supportive/resuscitative efforts were continued but the patient did not respond in a meaningful fashion.  When the patient's son arrived at the bedside frank discussions were had between he and the palliative care team as well as he and the attending  physician.  It was explained to him that his mother was not going to survive her multiple acute issues and that comfort focused care was likely all that could be successfully provided.  The patient's son voiced understanding of this but did ask that conservative medical measures be continued while the family gathered to make a decision as to her ultimate disposition.  Conservative medical treatment was therefore continued but balanced with comfort measures as well.  Despite these efforts the patient continued to rapidly decline.  On the morning of August 22, 2017 at 08:15 it was noted the patient was no longer breathing and she was declared dead.  Her son was at the bedside at the time.  Immediate cause of death is felt to be acute kidney failure in the setting of subacute myocardial infarction as well as acute severe systolic LV and RV heart failure.  Signed:  Lonia Blood  [facility] 08-22-17, 12:10 PM

## 2017-08-23 NOTE — Progress Notes (Signed)
Followed up with family after death.  They are in process of deciding between funeral homes.  They have spoken to Glennis Brink from [facility] and considering what they want to do for service-may want to invite some other special people to come and share at her service.  Provided spiritual support, had prayer of celebration over patient and the life she lived and the people she loved and who love her.   08/22/17 1000  Clinical Encounter Type  Visited With Family  Visit Type Follow-up;Spiritual support;Death  Referral From Chaplain  Consult/Referral To Chaplain  Spiritual Encounters  Spiritual Needs Prayer;Emotional  Stress Factors  Family Stress Factors Loss    Family was getting ready to go to funeral homes to visit and body will go to the morgue. Phebe Colla, Chaplain

## 2017-08-23 NOTE — Progress Notes (Signed)
Notified by patient's son that his mother stopped breathing for about a minute.  No heart beat (verified with Emelda Brothers, charge nurse).  Time of death 52.  Notified Dr. Sharon Seller and Palliative Care.  Spoke with [facility] to come to room, as patient's son is alone (his wife and son are flying back from [place] at this time).  Son Tammy Sours called the patient's Renato Gails, who is on his way.

## 2017-08-23 NOTE — Progress Notes (Signed)
   08/29/17 0800  Clinical Encounter Type  Visited With Family  Visit Type Spiritual support;Social support  Referral From Nurse  Consult/Referral To Chaplain  Spiritual Encounters  Spiritual Needs Emotional;Prayer;Ritual;Grief support  Stress Factors  Family Stress Factors Major life changes;Loss  Chaplain was called to the PT room by nurse to help the son of the PT cope with what was going on in the room.  The PT son was dealing with issues of grief and regret.  The family has went on a vacation while mom was sick but her condition took a turn for the worse.  While the Son was on vacation in [place] he came back to tend to mom.  Chaplain spoke  to PT son about life and death and the lack of control that we have in that.  It helped the PT come around to where his mom was in the dying process.

## 2017-08-23 NOTE — Progress Notes (Signed)
Urine output is 70cc since 1900 7/7. NP on call notified but no orders received. Will continue to montior.

## 2017-08-23 DEATH — deceased
